# Patient Record
Sex: Male | Born: 1975 | Race: White | Hispanic: No | Marital: Married | Smoking: Former smoker
Health system: Southern US, Community
[De-identification: ages and names within clinical notes are randomized; demographics above are authoritative.]

## PROBLEM LIST (undated history)

## (undated) DIAGNOSIS — N529 Male erectile dysfunction, unspecified: Secondary | ICD-10-CM

## (undated) DIAGNOSIS — I700 Atherosclerosis of aorta: Secondary | ICD-10-CM

## (undated) DIAGNOSIS — K635 Polyp of colon: Secondary | ICD-10-CM

## (undated) DIAGNOSIS — G500 Trigeminal neuralgia: Secondary | ICD-10-CM

## (undated) DIAGNOSIS — A4902 Methicillin resistant Staphylococcus aureus infection, unspecified site: Secondary | ICD-10-CM

## (undated) DIAGNOSIS — Z79899 Other long term (current) drug therapy: Secondary | ICD-10-CM

## (undated) DIAGNOSIS — Z7960 Long term (current) use of unspecified immunomodulators and immunosuppressants: Secondary | ICD-10-CM

## (undated) DIAGNOSIS — IMO0002 Reserved for concepts with insufficient information to code with codable children: Secondary | ICD-10-CM

## (undated) DIAGNOSIS — E1165 Type 2 diabetes mellitus with hyperglycemia: Secondary | ICD-10-CM

## (undated) DIAGNOSIS — M059 Rheumatoid arthritis with rheumatoid factor, unspecified: Secondary | ICD-10-CM

## (undated) DIAGNOSIS — I10 Essential (primary) hypertension: Secondary | ICD-10-CM

## (undated) DIAGNOSIS — N189 Chronic kidney disease, unspecified: Secondary | ICD-10-CM

## (undated) DIAGNOSIS — Z87442 Personal history of urinary calculi: Secondary | ICD-10-CM

## (undated) DIAGNOSIS — Z87891 Personal history of nicotine dependence: Secondary | ICD-10-CM

## (undated) DIAGNOSIS — R7989 Other specified abnormal findings of blood chemistry: Secondary | ICD-10-CM

## (undated) DIAGNOSIS — R918 Other nonspecific abnormal finding of lung field: Secondary | ICD-10-CM

## (undated) DIAGNOSIS — E785 Hyperlipidemia, unspecified: Secondary | ICD-10-CM

## (undated) HISTORY — DX: Hyperlipidemia, unspecified: E78.5

## (undated) HISTORY — DX: Rheumatoid arthritis with rheumatoid factor, unspecified: M05.9

## (undated) HISTORY — DX: Chronic kidney disease, unspecified: N18.9

## (undated) HISTORY — DX: Other long term (current) drug therapy: Z79.899

## (undated) HISTORY — DX: Type 2 diabetes mellitus with hyperglycemia: E11.65

## (undated) HISTORY — PX: TONSILLECTOMY: SHX5217

## (undated) HISTORY — DX: Trigeminal neuralgia: G50.0

## (undated) HISTORY — DX: Reserved for concepts with insufficient information to code with codable children: IMO0002

## (undated) HISTORY — DX: Essential (primary) hypertension: I10

## (undated) HISTORY — PX: APPENDECTOMY: SHX54

## (undated) HISTORY — PX: TONSILLECTOMY: SUR1361

---

## 2002-08-22 ENCOUNTER — Ambulatory Visit (HOSPITAL_COMMUNITY): Admission: RE | Admit: 2002-08-22 | Discharge: 2002-08-22 | Payer: Self-pay | Admitting: Family Medicine

## 2003-01-27 ENCOUNTER — Emergency Department (HOSPITAL_COMMUNITY): Admission: EM | Admit: 2003-01-27 | Discharge: 2003-01-27 | Payer: Self-pay | Admitting: Emergency Medicine

## 2003-12-26 ENCOUNTER — Emergency Department (HOSPITAL_COMMUNITY): Admission: EM | Admit: 2003-12-26 | Discharge: 2003-12-26 | Payer: Self-pay | Admitting: Emergency Medicine

## 2004-05-18 ENCOUNTER — Emergency Department: Payer: Self-pay | Admitting: Emergency Medicine

## 2004-07-23 ENCOUNTER — Emergency Department (HOSPITAL_COMMUNITY): Admission: EM | Admit: 2004-07-23 | Discharge: 2004-07-24 | Payer: Self-pay | Admitting: Emergency Medicine

## 2004-07-26 ENCOUNTER — Emergency Department (HOSPITAL_COMMUNITY): Admission: EM | Admit: 2004-07-26 | Discharge: 2004-07-26 | Payer: Self-pay | Admitting: Emergency Medicine

## 2004-07-27 ENCOUNTER — Emergency Department (HOSPITAL_COMMUNITY): Admission: EM | Admit: 2004-07-27 | Discharge: 2004-07-27 | Payer: Self-pay | Admitting: *Deleted

## 2007-03-16 ENCOUNTER — Emergency Department (HOSPITAL_COMMUNITY): Admission: EM | Admit: 2007-03-16 | Discharge: 2007-03-16 | Payer: Self-pay | Admitting: Emergency Medicine

## 2012-01-09 DIAGNOSIS — I2510 Atherosclerotic heart disease of native coronary artery without angina pectoris: Secondary | ICD-10-CM

## 2012-01-09 HISTORY — DX: Atherosclerotic heart disease of native coronary artery without angina pectoris: I25.10

## 2013-03-29 HISTORY — PX: VASECTOMY: SHX75

## 2013-04-20 DIAGNOSIS — Z302 Encounter for sterilization: Secondary | ICD-10-CM | POA: Insufficient documentation

## 2013-06-01 DIAGNOSIS — Z308 Encounter for other contraceptive management: Secondary | ICD-10-CM | POA: Insufficient documentation

## 2013-09-21 ENCOUNTER — Encounter: Payer: Self-pay | Admitting: Podiatry

## 2013-09-21 ENCOUNTER — Ambulatory Visit (INDEPENDENT_AMBULATORY_CARE_PROVIDER_SITE_OTHER): Payer: BC Managed Care – PPO

## 2013-09-21 ENCOUNTER — Ambulatory Visit (INDEPENDENT_AMBULATORY_CARE_PROVIDER_SITE_OTHER): Payer: BC Managed Care – PPO | Admitting: Podiatry

## 2013-09-21 VITALS — BP 131/85 | HR 77 | Resp 16 | Ht 73.0 in | Wt 250.0 lb

## 2013-09-21 DIAGNOSIS — M722 Plantar fascial fibromatosis: Secondary | ICD-10-CM

## 2013-09-21 MED ORDER — TRIAMCINOLONE ACETONIDE 10 MG/ML IJ SUSP
10.0000 mg | Freq: Once | INTRAMUSCULAR | Status: AC
  Administered 2013-09-21: 10 mg

## 2013-09-21 MED ORDER — MELOXICAM 15 MG PO TABS: 15.0000 mg | ORAL_TABLET | Freq: Every day | ORAL | Status: DC

## 2013-09-21 NOTE — Patient Instructions (Signed)
Plantar Fasciitis (Heel Spur Syndrome) with Rehab The plantar fascia is a fibrous, ligament-like, soft-tissue structure that spans the bottom of the foot. Plantar fasciitis is a condition that causes pain in the foot due to inflammation of the tissue. SYMPTOMS   Pain and tenderness on the underneath side of the foot.  Pain that worsens with standing or walking. CAUSES  Plantar fasciitis is caused by irritation and injury to the plantar fascia on the underneath side of the foot. Common mechanisms of injury include:  Direct trauma to bottom of the foot.  Damage to a small nerve that runs under the foot where the main fascia attaches to the heel bone.  Stress placed on the plantar fascia due to bone spurs. RISK INCREASES WITH:   Activities that place stress on the plantar fascia (running, jumping, pivoting, or cutting).  Poor strength and flexibility.  Improperly fitted shoes.  Tight calf muscles.  Flat feet.  Failure to warm-up properly before activity.  Obesity. PREVENTION  Warm up and stretch properly before activity.  Allow for adequate recovery between workouts.  Maintain physical fitness:  Strength, flexibility, and endurance.  Cardiovascular fitness.  Maintain a health body weight.  Avoid stress on the plantar fascia.  Wear properly fitted shoes, including arch supports for individuals who have flat feet. PROGNOSIS  If treated properly, then the symptoms of plantar fasciitis usually resolve without surgery. However, occasionally surgery is necessary. RELATED COMPLICATIONS   Recurrent symptoms that may result in a chronic condition.  Problems of the lower back that are caused by compensating for the injury, such as limping.  Pain or weakness of the foot during push-off following surgery.  Chronic inflammation, scarring, and partial or complete fascia tear, occurring more often from repeated injections. TREATMENT  Treatment initially involves the use of  ice and medication to help reduce pain and inflammation. The use of strengthening and stretching exercises may help reduce pain with activity, especially stretches of the Achilles tendon. These exercises may be performed at home or with a therapist. Your caregiver may recommend that you use heel cups of arch supports to help reduce stress on the plantar fascia. Occasionally, corticosteroid injections are given to reduce inflammation. If symptoms persist for greater than 6 months despite non-surgical (conservative), then surgery may be recommended.  MEDICATION   If pain medication is necessary, then nonsteroidal anti-inflammatory medications, such as aspirin and ibuprofen, or other minor pain relievers, such as acetaminophen, are often recommended.  Do not take pain medication within 7 days before surgery.  Prescription pain relievers may be given if deemed necessary by your caregiver. Use only as directed and only as much as you need.  Corticosteroid injections may be given by your caregiver. These injections should be reserved for the most serious cases, because they may only be given a certain number of times. HEAT AND COLD  Cold treatment (icing) relieves pain and reduces inflammation. Cold treatment should be applied for 10 to 15 minutes every 2 to 3 hours for inflammation and pain and immediately after any activity that aggravates your symptoms. Use ice packs or massage the area with a piece of ice (ice massage).  Heat treatment may be used prior to performing the stretching and strengthening activities prescribed by your caregiver, physical therapist, or athletic trainer. Use a heat pack or soak the injury in warm water. SEEK IMMEDIATE MEDICAL CARE IF:  Treatment seems to offer no benefit, or the condition worsens.  Any medications produce adverse side effects. EXERCISES RANGE   OF MOTION (ROM) AND STRETCHING EXERCISES - Plantar Fasciitis (Heel Spur Syndrome) These exercises may help you  when beginning to rehabilitate your injury. Your symptoms may resolve with or without further involvement from your physician, physical therapist or athletic trainer. While completing these exercises, remember:   Restoring tissue flexibility helps normal motion to return to the joints. This allows healthier, less painful movement and activity.  An effective stretch should be held for at least 30 seconds.  A stretch should never be painful. You should only feel a gentle lengthening or release in the stretched tissue. RANGE OF MOTION - Toe Extension, Flexion  Sit with your right / left leg crossed over your opposite knee.  Grasp your toes and gently pull them back toward the top of your foot. You should feel a stretch on the bottom of your toes and/or foot.  Hold this stretch for __________ seconds.  Now, gently pull your toes toward the bottom of your foot. You should feel a stretch on the top of your toes and or foot.  Hold this stretch for __________ seconds. Repeat __________ times. Complete this stretch __________ times per day.  RANGE OF MOTION - Ankle Dorsiflexion, Active Assisted  Remove shoes and sit on a chair that is preferably not on a carpeted surface.  Place right / left foot under knee. Extend your opposite leg for support.  Keeping your heel down, slide your right / left foot back toward the chair until you feel a stretch at your ankle or calf. If you do not feel a stretch, slide your bottom forward to the edge of the chair, while still keeping your heel down.  Hold this stretch for __________ seconds. Repeat __________ times. Complete this stretch __________ times per day.  STRETCH - Gastroc, Standing  Place hands on wall.  Extend right / left leg, keeping the front knee somewhat bent.  Slightly point your toes inward on your back foot.  Keeping your right / left heel on the floor and your knee straight, shift your weight toward the wall, not allowing your back to  arch.  You should feel a gentle stretch in the right / left calf. Hold this position for __________ seconds. Repeat __________ times. Complete this stretch __________ times per day. STRETCH - Soleus, Standing  Place hands on wall.  Extend right / left leg, keeping the other knee somewhat bent.  Slightly point your toes inward on your back foot.  Keep your right / left heel on the floor, bend your back knee, and slightly shift your weight over the back leg so that you feel a gentle stretch deep in your back calf.  Hold this position for __________ seconds. Repeat __________ times. Complete this stretch __________ times per day. STRETCH - Gastrocsoleus, Standing  Note: This exercise can place a lot of stress on your foot and ankle. Please complete this exercise only if specifically instructed by your caregiver.   Place the ball of your right / left foot on a step, keeping your other foot firmly on the same step.  Hold on to the wall or a rail for balance.  Slowly lift your other foot, allowing your body weight to press your heel down over the edge of the step.  You should feel a stretch in your right / left calf.  Hold this position for __________ seconds.  Repeat this exercise with a slight bend in your right / left knee. Repeat __________ times. Complete this stretch __________ times per day.    STRENGTHENING EXERCISES - Plantar Fasciitis (Heel Spur Syndrome)  These exercises may help you when beginning to rehabilitate your injury. They may resolve your symptoms with or without further involvement from your physician, physical therapist or athletic trainer. While completing these exercises, remember:   Muscles can gain both the endurance and the strength needed for everyday activities through controlled exercises.  Complete these exercises as instructed by your physician, physical therapist or athletic trainer. Progress the resistance and repetitions only as guided. STRENGTH -  Towel Curls  Sit in a chair positioned on a non-carpeted surface.  Place your foot on a towel, keeping your heel on the floor.  Pull the towel toward your heel by only curling your toes. Keep your heel on the floor.  If instructed by your physician, physical therapist or athletic trainer, add ____________________ at the end of the towel. Repeat __________ times. Complete this exercise __________ times per day. STRENGTH - Ankle Inversion  Secure one end of a rubber exercise band/tubing to a fixed object (table, pole). Loop the other end around your foot just before your toes.  Place your fists between your knees. This will focus your strengthening at your ankle.  Slowly, pull your big toe up and in, making sure the band/tubing is positioned to resist the entire motion.  Hold this position for __________ seconds.  Have your muscles resist the band/tubing as it slowly pulls your foot back to the starting position. Repeat __________ times. Complete this exercises __________ times per day.  Document Released: 03/15/2005 Document Revised: 06/07/2011 Document Reviewed: 06/27/2008 [employer] Patient Information 2015 [employer]. This information is not intended to replace advice given to you by your health care provider. Make sure you discuss any questions you have with your health care provider.  

## 2013-09-21 NOTE — Progress Notes (Signed)
   Subjective:    Patient ID: Nathan Flockonald R Geers Jr., male    DOB: 14-Jul-1975, 38 y.o.   MRN: [id number]  HPI Comments: Been having right heel pain for over a month now, it is a sharp pain. It is not constant and does not hurt every day.  i have noticed that i am walking on the side of my foot   Foot Pain      Review of Systems  Endocrine:       Diabetes  All other systems reviewed and are negative.      Objective:   Physical Exam        Assessment & Plan:

## 2013-09-22 NOTE — Progress Notes (Signed)
Subjective:     Patient ID: Nathan Flockonald R Renegar Jr., male   DOB: 09-12-1975, 38 y.o.   MRN: [id number]  Foot Pain   patient states he's having a lot of pain in his right heel and it's difficult when he gets up in the morning and after periods of sitting and is been going on for over a month   Review of Systems  All other systems reviewed and are negative.      Objective:   Physical Exam  Nursing note and vitals reviewed. Constitutional: He is oriented to person, place, and time.  Cardiovascular: Intact distal pulses.   Musculoskeletal: Normal range of motion.  Neurological: He is oriented to person, place, and time.  Skin: Skin is warm.   neurovascular status intact with muscle strength adequate and range of motion subtalar midtarsal joint within normal limits. Patient's found to have good digital perfusion and normal arch height and is found to have severe discomfort at the insertion of the medial fascially band into the calcaneus     Assessment:     Plantar fasciitis of the right heel insertional    Plan:     H&P and x-ray reviewed and injected the right plantar fascia 3 mg Kenalog 5 mg I can Marcaine mixture and applied fascially brace with instructions and placed on diclofenac and reappoint one month when he returns from vacation

## 2013-10-19 ENCOUNTER — Ambulatory Visit: Payer: BC Managed Care – PPO | Admitting: Podiatry

## 2014-09-16 DIAGNOSIS — E1165 Type 2 diabetes mellitus with hyperglycemia: Secondary | ICD-10-CM | POA: Insufficient documentation

## 2014-09-16 DIAGNOSIS — E1169 Type 2 diabetes mellitus with other specified complication: Secondary | ICD-10-CM | POA: Insufficient documentation

## 2014-09-16 DIAGNOSIS — N189 Chronic kidney disease, unspecified: Secondary | ICD-10-CM | POA: Insufficient documentation

## 2014-09-16 DIAGNOSIS — E785 Hyperlipidemia, unspecified: Secondary | ICD-10-CM | POA: Insufficient documentation

## 2014-09-16 DIAGNOSIS — I10 Essential (primary) hypertension: Secondary | ICD-10-CM | POA: Insufficient documentation

## 2014-09-20 ENCOUNTER — Ambulatory Visit (INDEPENDENT_AMBULATORY_CARE_PROVIDER_SITE_OTHER): Payer: BLUE CROSS/BLUE SHIELD | Admitting: Family Medicine

## 2014-09-20 ENCOUNTER — Encounter: Payer: Self-pay | Admitting: Family Medicine

## 2014-09-20 VITALS — BP 125/88 | HR 78 | Temp 97.7°F | Ht 72.5 in | Wt 249.0 lb

## 2014-09-20 DIAGNOSIS — Z5181 Encounter for therapeutic drug level monitoring: Secondary | ICD-10-CM | POA: Diagnosis not present

## 2014-09-20 DIAGNOSIS — F1910 Other psychoactive substance abuse, uncomplicated: Secondary | ICD-10-CM | POA: Diagnosis not present

## 2014-09-20 DIAGNOSIS — E119 Type 2 diabetes mellitus without complications: Secondary | ICD-10-CM

## 2014-09-20 DIAGNOSIS — E785 Hyperlipidemia, unspecified: Secondary | ICD-10-CM

## 2014-09-20 DIAGNOSIS — I10 Essential (primary) hypertension: Secondary | ICD-10-CM

## 2014-09-20 DIAGNOSIS — Z720 Tobacco use: Secondary | ICD-10-CM

## 2014-09-20 DIAGNOSIS — N182 Chronic kidney disease, stage 2 (mild): Secondary | ICD-10-CM | POA: Diagnosis not present

## 2014-09-20 LAB — MICROALBUMIN, URINE WAIVED
Creatinine, Urine Waived: 300 mg/dL (ref 10–300)
Microalb, Ur Waived: 30 mg/L — ABNORMAL HIGH (ref 0–19)
Microalb/Creat Ratio: <30 mg/g (ref <30)

## 2014-09-20 MED ORDER — VARENICLINE TARTRATE 0.5 MG X 11 & 1 MG X 42 PO MISC: ORAL | Status: DC

## 2014-09-20 NOTE — Assessment & Plan Note (Signed)
Foot exam by MD today; check feet every night; limit sweets; modest weigh tloss, check A1C and microalbumin today; if A1C controlled, back in 6 months

## 2014-09-20 NOTE — Assessment & Plan Note (Signed)
Check labels and limit sodium; avoid decongetsants; work on modest weight loss; continue ACE-I; check Cr and K+

## 2014-09-20 NOTE — Assessment & Plan Note (Signed)
Limit saturated fats like bacon, sausage, hot dogs, bologna; continue statin, check lipids and sgpt today

## 2014-09-20 NOTE — Progress Notes (Signed)
BP 125/88 mmHg  Pulse 78  Temp(Src) 97.7 F (36.5 C)  Ht 6' 0.5" (1.842 m)  Wt 249 lb (112.946 kg)  BMI 33.29 kg/m2  SpO2 96%   Subjective:    Patient ID: Nathan Rojas, male    DOB: 12-05-1975, 39 y.o.   MRN: [id number]  HPI: Nathan Rojas is a 39 y.o. male  Chief Complaint  Patient presents with  . Diabetes  . Hyperlipidemia  . Hypertension   He came fasting for labs this morning F/u for several issues No major incidents last visit  HTN; he does not check BP away from doctor's; limits salt; does not add salt to his food; does not add salt when cooking; checks labels; no decongestants  Diabetes; checking FSBS occasionally, keeping sugars less than 140; limiting sweets and no more sweet tea; not checking feet; last eye exam early 2015, no damage;   High chol; typical Southern diet; has cut back on fried foods  He has started smoking; he says the stress of his job, grabs a cig to relieve stress; pretty sporadic, does not smoke in the house; just smokes out and about  Relevant past medical, surgical, family and social history reviewed and updated as indicated. Interim medical history since our last visit reviewed. Allergies and medications reviewed and updated.  Review of Systems  Per HPI unless specifically indicated above     Objective:    BP 125/88 mmHg  Pulse 78  Temp(Src) 97.7 F (36.5 C)  Ht 6' 0.5" (1.842 m)  Wt 249 lb (112.946 kg)  BMI 33.29 kg/m2  SpO2 96%  Wt Readings from Last 3 Encounters:  09/20/14 249 lb (112.946 kg)  12/21/13 234 lb (106.142 kg)  09/21/13 250 lb (113.399 kg)    Physical Exam  Constitutional: He appears well-developed and well-nourished. No distress.  HENT:  Head: Normocephalic and atraumatic.  Nose: No rhinorrhea.  Mouth/Throat: Mucous membranes are normal.  Eyes: EOM are normal. No scleral icterus.  Neck: No JVD present.  Cardiovascular: Normal rate, regular rhythm and normal heart sounds.   No murmur  heard. Pulmonary/Chest: Effort normal and breath sounds normal. No respiratory distress. He has no wheezes.  Abdominal: Soft. Bowel sounds are normal. He exhibits no distension. There is no tenderness.  Musculoskeletal: He exhibits no edema.  Neurological: He is alert. He displays no tremor. He exhibits normal muscle tone.  Skin: Skin is warm and dry. No rash noted. He is not diaphoretic. No cyanosis.  Psychiatric: He has a normal mood and affect. His behavior is normal. Judgment and thought content normal.  Nursing note and vitals reviewed.  Diabetic Foot Form - Detailed   Diabetic Foot Exam - detailed  Diabetic Foot exam was performed with the following findings:  Yes 09/20/2014  8:46 AM  Visual Foot Exam completed.:  Yes  Are the toenails long?:  No  Are the toenails thick?:  No  Are the toenails ingrown?:  No    Pulse Foot Exam completed.:  Yes  Right Dorsalis Pedis:  Present Left Dorsalis Pedis:  Present  Sensory Foot Exam Completed.:  Yes  Semmes-Weinstein Monofilament Test  R Site 1-Great Toe:  Pos L Site 1-Great Toe:  Pos    Comments:  Intact to vibration and monofilament testing      No results found for this or any previous visit.    Assessment & Plan:   Problem List Items Addressed This Visit      Cardiovascular and Mediastinum  Hypertension    Check labels and limit sodium; avoid decongetsants; work on modest weight loss; continue ACE-I; check Cr and K+        Endocrine   Diabetes mellitus without complication - Primary    Foot exam by MD today; check feet every night; limit sweets; modest weigh tloss, check A1C and microalbumin today; if A1C controlled, back in 6 months      Relevant Orders   Hgb A1c w/o eAG   Lipid Panel w/o Chol/HDL Ratio   Microalbumin / creatinine urine ratio     Genitourinary   CKD (chronic kidney disease)    Monitor urine microalbumin and Cr; limit Mobic as much as possible        Other   Hyperlipidemia    Limit saturated  fats like bacon, sausage, hot dogs, bologna; continue statin, check lipids and sgpt today      Relevant Orders   Lipid Panel w/o Chol/HDL Ratio    Other Visit Diagnoses    Tobacco abuse, episodic        patient ready to quit; Chantix prescribed, risks discussed    Relevant Medications    varenicline (CHANTIX STARTING MONTH PAK) 0.5 MG X 11 & 1 MG X 42 tablet    Medication monitoring encounter        Relevant Orders    Comprehensive metabolic panel        Follow up plan: Return in about 6 months (around 03/22/2015).  An After Visit Summary was printed and given to the patient.

## 2014-09-20 NOTE — Patient Instructions (Addendum)
Check feet every night, good habit to get into Okay to check sugars just occasionally Do try to find another outlet for stress that's healthy Please see your eye doctor yearly Your goal blood pressure is less than 130 mmHg Try to follow the DASH guidelines (DASH stands for Dietary Approaches to Stop Hypertension) Try to limit the sodium in your diet.  Ideally, consume less than 1.5 grams (less than 1,500mg ) per day. Do not add salt when cooking or at the table.  Check the sodium amount on labels when shopping, and choose items lower in sodium when given a choice. Avoid or limit foods that already contain a lot of sodium. Eat a diet rich in fruits and vegetables and whole grains. You can quit smoking; it will be the best thing you do for your health Side effects of Chantix may include nausea/vomiting, vivid dreams, behaviour changes, depression, suicidal ideation You can call 1-800-QUIT-NOW to speak with a quit coach and get assistance with quitting Choose a quit date about 1 week after starting medicine. Don't take meloxicam or other non-steroidals unless absolutely needed; tylenol (acetaminophen) is safest for headaches, minor pains and won't harm your kidneys; always use that per package instructions Try to lose 5-10 pounds before your next visit

## 2014-09-20 NOTE — Assessment & Plan Note (Signed)
Monitor urine microalbumin and Cr; limit Mobic as much as possible

## 2014-09-21 LAB — LIPID PANEL W/O CHOL/HDL RATIO
Cholesterol, Total: 101 mg/dL (ref 100–199)
HDL: 22 mg/dL — ABNORMAL LOW (ref >39)
LDL Calculated: 42 mg/dL (ref 0–99)
Triglycerides: 184 mg/dL — ABNORMAL HIGH (ref 0–149)
VLDL Cholesterol Cal: 37 mg/dL (ref 5–40)

## 2014-09-21 LAB — COMPREHENSIVE METABOLIC PANEL
ALT: 33 IU/L (ref 0–44)
AST: 21 IU/L (ref 0–40)
Albumin/Globulin Ratio: 2 (ref 1.1–2.5)
Albumin: 4.6 g/dL (ref 3.5–5.5)
Alkaline Phosphatase: 67 IU/L (ref 39–117)
BUN/Creatinine Ratio: 19 (ref 9–20)
BUN: 18 mg/dL (ref 6–24)
Bilirubin Total: 0.6 mg/dL (ref 0.0–1.2)
CO2: 22 mmol/L (ref 18–29)
Calcium: 9.4 mg/dL (ref 8.7–10.2)
Chloride: 101 mmol/L (ref 97–108)
Creatinine, Ser: 0.96 mg/dL (ref 0.76–1.27)
GFR calc Af Amer: 113 mL/min/{1.73_m2} (ref >59)
GFR calc non Af Amer: 98 mL/min/{1.73_m2} (ref >59)
Globulin, Total: 2.3 g/dL (ref 1.5–4.5)
Glucose: 167 mg/dL — ABNORMAL HIGH (ref 65–99)
Potassium: 5.1 mmol/L (ref 3.5–5.2)
Sodium: 140 mmol/L (ref 134–144)
Total Protein: 6.9 g/dL (ref 6.0–8.5)

## 2014-09-21 LAB — HGB A1C W/O EAG: Hgb A1c MFr Bld: 7.6 % — ABNORMAL HIGH (ref 4.8–5.6)

## 2014-10-15 ENCOUNTER — Other Ambulatory Visit: Payer: Self-pay | Admitting: Family Medicine

## 2014-10-15 NOTE — Telephone Encounter (Signed)
Routing to provider  

## 2014-10-17 ENCOUNTER — Other Ambulatory Visit: Payer: Self-pay | Admitting: Family Medicine

## 2014-10-29 ENCOUNTER — Telehealth: Payer: Self-pay | Admitting: Family Medicine

## 2014-10-29 NOTE — Telephone Encounter (Signed)
Pt called stated Dr. Sherie Don was supposed to send an RX for Chantix but the pharmacy has never received the RX. Pharm is [facility] on [address] Thanks.

## 2014-10-29 NOTE — Telephone Encounter (Signed)
RX was already in chart. RX called into the pharmacy.

## 2014-11-13 ENCOUNTER — Other Ambulatory Visit: Payer: Self-pay | Admitting: Family Medicine

## 2014-11-13 NOTE — Telephone Encounter (Signed)
Routing to provider  

## 2014-11-15 ENCOUNTER — Emergency Department: Payer: BLUE CROSS/BLUE SHIELD

## 2014-11-15 ENCOUNTER — Emergency Department
Admission: EM | Admit: 2014-11-15 | Discharge: 2014-11-15 | Disposition: A | Discharge status: Home / Self Care | Payer: BLUE CROSS/BLUE SHIELD | Attending: Emergency Medicine | Admitting: Emergency Medicine

## 2014-11-15 DIAGNOSIS — I129 Hypertensive chronic kidney disease with stage 1 through stage 4 chronic kidney disease, or unspecified chronic kidney disease: Secondary | ICD-10-CM | POA: Diagnosis not present

## 2014-11-15 DIAGNOSIS — R109 Unspecified abdominal pain: Secondary | ICD-10-CM

## 2014-11-15 DIAGNOSIS — E119 Type 2 diabetes mellitus without complications: Secondary | ICD-10-CM | POA: Diagnosis not present

## 2014-11-15 DIAGNOSIS — Z79899 Other long term (current) drug therapy: Secondary | ICD-10-CM | POA: Diagnosis not present

## 2014-11-15 DIAGNOSIS — N200 Calculus of kidney: Secondary | ICD-10-CM | POA: Diagnosis not present

## 2014-11-15 DIAGNOSIS — Z7982 Long term (current) use of aspirin: Secondary | ICD-10-CM | POA: Insufficient documentation

## 2014-11-15 DIAGNOSIS — Z720 Tobacco use: Secondary | ICD-10-CM | POA: Diagnosis not present

## 2014-11-15 DIAGNOSIS — N189 Chronic kidney disease, unspecified: Secondary | ICD-10-CM | POA: Diagnosis not present

## 2014-11-15 DIAGNOSIS — I700 Atherosclerosis of aorta: Secondary | ICD-10-CM | POA: Clinically undetermined

## 2014-11-15 LAB — CBC WITH DIFFERENTIAL/PLATELET
Basophils Absolute: 0 10*3/uL (ref 0–0.1)
Basophils Relative: 0 %
Eosinophils Absolute: 0 10*3/uL (ref 0–0.7)
Eosinophils Relative: 0 %
HCT: 44.3 % (ref 40.0–52.0)
Hemoglobin: 14.9 g/dL (ref 13.0–18.0)
Lymphocytes Relative: 15 %
Lymphs Abs: 1.4 10*3/uL (ref 1.0–3.6)
MCH: 29.5 pg (ref 26.0–34.0)
MCHC: 33.7 g/dL (ref 32.0–36.0)
MCV: 87.4 fL (ref 80.0–100.0)
Monocytes Absolute: 0.4 10*3/uL (ref 0.2–1.0)
Monocytes Relative: 4 %
Neutro Abs: 7.3 10*3/uL — ABNORMAL HIGH (ref 1.4–6.5)
Neutrophils Relative %: 81 %
Platelets: 279 10*3/uL (ref 150–440)
RBC: 5.07 MIL/uL (ref 4.40–5.90)
RDW: 13.8 % (ref 11.5–14.5)
WBC: 9.1 10*3/uL (ref 3.8–10.6)

## 2014-11-15 LAB — URINALYSIS COMPLETE WITH MICROSCOPIC (ARMC ONLY)
Bacteria, UA: NONE SEEN
Bilirubin Urine: NEGATIVE
Glucose, UA: NEGATIVE mg/dL
Ketones, ur: NEGATIVE mg/dL
Leukocytes, UA: NEGATIVE
Nitrite: NEGATIVE
Protein, ur: 30 mg/dL — AB
Specific Gravity, Urine: 1.023 (ref 1.005–1.030)
pH: 5 (ref 5.0–8.0)

## 2014-11-15 LAB — COMPREHENSIVE METABOLIC PANEL
ALT: 35 U/L (ref 17–63)
AST: 29 U/L (ref 15–41)
Albumin: 5.1 g/dL — ABNORMAL HIGH (ref 3.5–5.0)
Alkaline Phosphatase: 67 U/L (ref 38–126)
Anion gap: 11 (ref 5–15)
BUN: 22 mg/dL — ABNORMAL HIGH (ref 6–20)
CO2: 24 mmol/L (ref 22–32)
Calcium: 9.6 mg/dL (ref 8.9–10.3)
Chloride: 105 mmol/L (ref 101–111)
Creatinine, Ser: 1.29 mg/dL — ABNORMAL HIGH (ref 0.61–1.24)
GFR calc Af Amer: >60 mL/min (ref >60)
GFR calc non Af Amer: >60 mL/min (ref >60)
Glucose, Bld: 144 mg/dL — ABNORMAL HIGH (ref 65–99)
Potassium: 4.4 mmol/L (ref 3.5–5.1)
Sodium: 140 mmol/L (ref 135–145)
Total Bilirubin: 1.1 mg/dL (ref 0.3–1.2)
Total Protein: 8.1 g/dL (ref 6.5–8.1)

## 2014-11-15 MED ORDER — NAPROXEN 500 MG PO TABS: 500.0000 mg | ORAL_TABLET | Freq: Two times a day (BID) | ORAL | Status: DC

## 2014-11-15 MED ORDER — KETOROLAC TROMETHAMINE 30 MG/ML IJ SOLN
30.0000 mg | Freq: Once | INTRAMUSCULAR | Status: AC
  Administered 2014-11-15: 30 mg via INTRAVENOUS
  Filled 2014-11-15: qty 1

## 2014-11-15 MED ORDER — OXYCODONE-ACETAMINOPHEN 5-325 MG PO TABS: 1.0000 | ORAL_TABLET | Freq: Four times a day (QID) | ORAL | Status: DC | PRN

## 2014-11-15 MED ORDER — SODIUM CHLORIDE 0.9 % IV SOLN
1000.0000 mL | Freq: Once | INTRAVENOUS | Status: AC
  Administered 2014-11-15: 1000 mL via INTRAVENOUS

## 2014-11-15 MED ORDER — TAMSULOSIN HCL 0.4 MG PO CAPS: 0.4000 mg | ORAL_CAPSULE | Freq: Every day | ORAL | Status: DC

## 2014-11-15 MED ORDER — ONDANSETRON HCL 4 MG PO TABS: 4.0000 mg | ORAL_TABLET | Freq: Every day | ORAL | Status: DC | PRN

## 2014-11-15 MED ORDER — CIPROFLOXACIN HCL 500 MG PO TABS
500.0000 mg | ORAL_TABLET | Freq: Two times a day (BID) | ORAL | Status: DC
Start: 2014-11-15 — End: 2015-04-18

## 2014-11-15 NOTE — ED Provider Notes (Signed)
[facility] Emergency Department Provider Note  ____________________________________________  Time seen: On arrival  I have reviewed the triage vital signs and the nursing notes.   HISTORY  Chief Complaint Abdominal Pain    HPI Nathan Rojas is a 39 y.o. male who presents with left groin pain radiating to his left flank. This began abruptly this morning and is intermittently becomes severe to the point that it has left him sweating and in the fetal position. He has never had this before. No history of kidney stones. No fevers or chills. No dysuria. He has noticed his urine was dark. No groin mass     Past Medical History  Diagnosis Date  . Diabetes mellitus without complication   . Hypertension   . Hyperlipidemia   . CKD (chronic kidney disease)     Patient Active Problem List   Diagnosis Date Noted  . Diabetes mellitus without complication   . Hypertension   . Hyperlipidemia   . CKD (chronic kidney disease)     Past Surgical History  Procedure Laterality Date  . Tonsillectomy    . Appendectomy    . Vasectomy  Jan 2015    Current Outpatient Rx  Name  Route  Sig  Dispense  Refill  . aspirin EC 81 MG tablet   Oral   Take 81 mg by mouth daily.         Marland Kitchen atorvastatin (LIPITOR) 40 MG tablet   Oral   Take 1 tablet (40 mg total) by mouth at bedtime.   90 tablet   1   . lisinopril (PRINIVIL,ZESTRIL) 10 MG tablet      TAKE 1 TABLET DAILY   90 tablet   1   . metFORMIN (GLUCOPHAGE) 500 MG tablet   Oral   Take 2 tablets (1,000 mg total) by mouth 2 (two) times daily. Best if taken 15-30 minutes prior to morning and evening meals   360 tablet   1   . varenicline (CHANTIX STARTING MONTH PAK) 0.5 MG X 11 & 1 MG X 42 tablet      Take one 0.5 mg tablet by mouth once daily for 3 days, then increase to one 0.5 mg tablet twice daily for 4 days, then increase to one 1 mg tablet twice daily.   53 tablet   0     Allergies Review of  patient's allergies indicates no known allergies.  Family History  Problem Relation Age of Onset  . Heart disease Father   . Hypertension Father   . Diabetes Father     borderline  . Mental illness Father   . Diabetes Sister     type 1    Social History Social History  Substance Use Topics  . Smoking status: Current Every Day Smoker -- 1.00 packs/day    Types: Cigarettes  . Smokeless tobacco: Never Used  . Alcohol Use: No    Review of Systems  Constitutional: Negative for fever. Eyes: Negative for visual changes. ENT: Negative for sore throat Cardiovascular: Negative for chest pain. Respiratory: Negative for shortness of breath. Gastrointestinal: Positive for flank pain Genitourinary: Negative for dysuria. Musculoskeletal: Negative for back pain. Skin: Negative for rash. Neurological: Negative for headaches or focal weakness     ____________________________________________   PHYSICAL EXAM:  VITAL SIGNS: ED Triage Vitals  Enc Vitals Group     BP 11/15/14 1909 149/91 mmHg     Pulse Rate 11/15/14 1909 69     Resp 11/15/14 1909 14  Temp 11/15/14 1909 98.3 F (36.8 C)     Temp Source 11/15/14 1909 Oral     SpO2 11/15/14 1909 97 %     Weight 11/15/14 1909 250 lb (113.399 kg)     Height 11/15/14 1909 6\' 1"  (1.854 m)     Head Cir --      Peak Flow --      Pain Score 11/15/14 1910 6     Pain Loc --      Pain Edu? --      Excl. in GC? --     } Constitutional: Alert and oriented. Well appearing and in no distress. Eyes: Conjunctivae are normal.  ENT   Head: Normocephalic and atraumatic.   Mouth/Throat: Mucous membranes are moist. Cardiovascular: Normal rate, regular rhythm. Normal and symmetric distal pulses are present in all extremities. No murmurs, rubs, or gallops. Respiratory: Normal respiratory effort without tachypnea nor retractions. Breath sounds are clear and equal bilaterally.  Gastrointestinal: Soft and non-tender in all quadrants. No  distention. There is no CVA tenderness. Genitourinary: No hernia noted Musculoskeletal: Nontender with normal range of motion in all extremities. No lower extremity tenderness nor edema. Neurologic:  Normal speech and language. No gross focal neurologic deficits are appreciated. Skin:  Skin is warm, dry and intact. No rash noted. Psychiatric: Mood and affect are normal. Patient exhibits appropriate insight and judgment.  ____________________________________________    LABS (pertinent positives/negatives)  Labs Reviewed  COMPREHENSIVE METABOLIC PANEL - Abnormal; Notable for the following:    Glucose, Bld 144 (*)    BUN 22 (*)    Creatinine, Ser 1.29 (*)    Albumin 5.1 (*)    All other components within normal limits  CBC WITH DIFFERENTIAL/PLATELET - Abnormal; Notable for the following:    Neutro Abs 7.3 (*)    All other components within normal limits  URINALYSIS COMPLETEWITH MICROSCOPIC ([facility] ONLY) - Abnormal; Notable for the following:    Color, Urine YELLOW (*)    APPearance CLOUDY (*)    Hgb urine dipstick 3+ (*)    Protein, ur 30 (*)    Squamous Epithelial / LPF 0-5 (*)    All other components within normal limits    ____________________________________________   EKG  None  ____________________________________________    RADIOLOGY I have personally reviewed any xrays that were ordered on this patient: Patient with 2 x 2 3 mm kidney stone  ____________________________________________   PROCEDURES  Procedure(s) performed: none  Critical Care performed: none  ____________________________________________   INITIAL IMPRESSION / ASSESSMENT AND PLAN / ED COURSE  Pertinent labs & imaging results that were available during my care of the patient were reviewed by me and considered in my medical decision making (see chart for details).  History of present illness suspicious for kidney stone. We will give IV Toradol, normal saline bolus and obtain CT renal stone  study and reevaluate   Pain controlled with IV Toradol. He has no bacteria in his urine, no fever, no elevated white blood cell count. He does have a history of diabetes. I provided analgesic's and nausea medication and discussed with him return precautions ____________________________________________   FINAL CLINICAL IMPRESSION(S) / ED DIAGNOSES  Final diagnoses:  Flank pain, acute  Kidney stone     Jene Every, MD 11/15/14 2303

## 2014-11-15 NOTE — ED Notes (Signed)
Pt states left lower quadrant pain radiating from left groin, testicle to left lower quadrant since this am with associated vomiting and "liquid stools". Pt states has "had cold sweats".

## 2014-11-15 NOTE — Discharge Instructions (Signed)

## 2014-11-15 NOTE — ED Notes (Signed)
Pt. Driving home. 

## 2014-11-15 NOTE — ED Notes (Signed)
Pt. States lt. Groan pain that stated around 10am this morning.  Pt. States pain radiated to lt. Flank around 4pm this afternoon.  Pt. Denies hx of kidney stones.

## 2014-11-16 DIAGNOSIS — I700 Atherosclerosis of aorta: Secondary | ICD-10-CM | POA: Clinically undetermined

## 2014-11-22 ENCOUNTER — Ambulatory Visit (INDEPENDENT_AMBULATORY_CARE_PROVIDER_SITE_OTHER): Payer: BLUE CROSS/BLUE SHIELD | Admitting: Family Medicine

## 2014-11-22 ENCOUNTER — Encounter: Payer: Self-pay | Admitting: Family Medicine

## 2014-11-22 VITALS — BP 145/97 | HR 67 | Temp 98.6°F | Ht 72.1 in | Wt 245.0 lb

## 2014-11-22 DIAGNOSIS — N200 Calculus of kidney: Secondary | ICD-10-CM | POA: Insufficient documentation

## 2014-11-22 HISTORY — DX: Calculus of kidney: N20.0

## 2014-11-22 LAB — MICROSCOPIC EXAMINATION

## 2014-11-22 MED ORDER — TAMSULOSIN HCL 0.4 MG PO CAPS: 0.4000 mg | ORAL_CAPSULE | Freq: Every day | ORAL | Status: DC

## 2014-11-22 MED ORDER — OXYCODONE-ACETAMINOPHEN 5-325 MG PO TABS: 1.0000 | ORAL_TABLET | Freq: Four times a day (QID) | ORAL | Status: DC | PRN

## 2014-11-22 NOTE — Patient Instructions (Signed)

## 2014-11-22 NOTE — Assessment & Plan Note (Signed)
Still has blood in his urine. Will restart his flomax. Will get him refill on pain medicine. Will start peeing through coffee filter and add some diet lemonade to his fluids. If hasn't passed it in 1 week, plan on re-x-raying to make sure it's moving and determine if he needs to see uro.

## 2014-11-22 NOTE — Progress Notes (Signed)
BP 145/97 mmHg  Pulse 67  Temp(Src) 98.6 F (37 C)  Ht 6' 0.1" (1.831 m)  Wt 245 lb (111.131 kg)  BMI 33.15 kg/m2  SpO2 97%   Subjective:    Patient ID: Nathan Rojas, male    DOB: 15-Oct-1975, 39 y.o.   MRN: [id number]  HPI: Nathan Rojas is a 39 y.o. male  Chief Complaint  Patient presents with  . Nephrolithiasis    patient states that for the last two days he has not been in a lot of pain, the Naproxen is controlling the pain right now. He states that when he does have a bad flare, he was taking the pain medication, which is now out of. He still has not passed the stone yet, so he would like to know if you can give him a refill of the pain medication.    ER FOLLOW UP Time since discharge: 5 days Hospital/facility: [facility] Diagnosis: Kidney stone Procedures/tests: X-ray, CT, IV tordol Consultants: none New medications: cipro, zofran, naproxen, flomax, percocet   Discharge instructions:  Follow up with PCP Status: better- has not passed stone yet  Finished the flomax yesterday. Hasn't noticed that he has passed that he hasn't passed any stones. Has been drinking lots of fluids. Naproxen is controlling the pain for now, but if he has a stone move, it won't control it. Not noticing any blood in his urine. Hasn't had any big spurts of pain in the past 2 days. Afraid that he is going to have more of that spurts of pain this weekend. Otherwise feeling well with no other concerns or complaints at this time.   Relevant past medical, surgical, family and social history reviewed and updated as indicated. Interim medical history since our last visit reviewed. Allergies and medications reviewed and updated.  Review of Systems  Constitutional: Negative.   Respiratory: Negative.   Cardiovascular: Negative.   Gastrointestinal: Negative.   Genitourinary: Negative.   Psychiatric/Behavioral: Negative.     Per HPI unless specifically indicated above     Objective:    BP 145/97 mmHg   Pulse 67  Temp(Src) 98.6 F (37 C)  Ht 6' 0.1" (1.831 m)  Wt 245 lb (111.131 kg)  BMI 33.15 kg/m2  SpO2 97%  Wt Readings from Last 3 Encounters:  11/22/14 245 lb (111.131 kg)  11/15/14 250 lb (113.399 kg)  09/20/14 249 lb (112.946 kg)    Physical Exam  Constitutional: He is oriented to person, place, and time. He appears well-developed and well-nourished. No distress.  HENT:  Head: Normocephalic and atraumatic.  Right Ear: Hearing normal.  Left Ear: Hearing normal.  Nose: Nose normal.  Eyes: Conjunctivae and lids are normal. Right eye exhibits no discharge. Left eye exhibits no discharge. No scleral icterus.  Cardiovascular: Normal rate, regular rhythm and intact distal pulses.  Exam reveals no gallop and no friction rub.   No murmur heard. Pulmonary/Chest: Effort normal and breath sounds normal. No respiratory distress. He has no wheezes. He has no rales. He exhibits no tenderness.  Abdominal: Soft. Bowel sounds are normal. He exhibits no distension and no mass. There is tenderness in the left upper quadrant and left lower quadrant. There is no rebound, no guarding and no CVA tenderness.  Musculoskeletal: Normal range of motion.  Neurological: He is alert and oriented to person, place, and time.  Skin: Skin is warm, dry and intact. No rash noted. No erythema. No pallor.  Psychiatric: He has a normal mood and affect. His  speech is normal and behavior is normal. Judgment and thought content normal. Cognition and memory are normal.  Nursing note and vitals reviewed.   Results for orders placed or performed during the hospital encounter of 11/15/14  Comprehensive metabolic panel  Result Value Ref Range   Sodium 140 135 - 145 mmol/L   Potassium 4.4 3.5 - 5.1 mmol/L   Chloride 105 101 - 111 mmol/L   CO2 24 22 - 32 mmol/L   Glucose, Bld 144 (H) 65 - 99 mg/dL   BUN 22 (H) 6 - 20 mg/dL   Creatinine, Ser 1.61 (H) 0.61 - 1.24 mg/dL   Calcium 9.6 8.9 - 09.6 mg/dL   Total Protein 8.1  6.5 - 8.1 g/dL   Albumin 5.1 (H) 3.5 - 5.0 g/dL   AST 29 15 - 41 U/L   ALT 35 17 - 63 U/L   Alkaline Phosphatase 67 38 - 126 U/L   Total Bilirubin 1.1 0.3 - 1.2 mg/dL   GFR calc non Af Amer >60 >60 mL/min   GFR calc Af Amer >60 >60 mL/min   Anion gap 11 5 - 15  CBC WITH DIFFERENTIAL  Result Value Ref Range   WBC 9.1 3.8 - 10.6 K/uL   RBC 5.07 4.40 - 5.90 MIL/uL   Hemoglobin 14.9 13.0 - 18.0 g/dL   HCT 04.5 40.9 - 81.1 %   MCV 87.4 80.0 - 100.0 fL   MCH 29.5 26.0 - 34.0 pg   MCHC 33.7 32.0 - 36.0 g/dL   RDW 91.4 78.2 - 95.6 %   Platelets 279 150 - 440 K/uL   Neutrophils Relative % 81 %   Neutro Abs 7.3 (H) 1.4 - 6.5 K/uL   Lymphocytes Relative 15 %   Lymphs Abs 1.4 1.0 - 3.6 K/uL   Monocytes Relative 4 %   Monocytes Absolute 0.4 0.2 - 1.0 K/uL   Eosinophils Relative 0 %   Eosinophils Absolute 0.0 0 - 0.7 K/uL   Basophils Relative 0 %   Basophils Absolute 0.0 0 - 0.1 K/uL  Urinalysis complete, with microscopic ([facility] only)  Result Value Ref Range   Color, Urine YELLOW (A) YELLOW   APPearance CLOUDY (A) CLEAR   Glucose, UA NEGATIVE NEGATIVE mg/dL   Bilirubin Urine NEGATIVE NEGATIVE   Ketones, ur NEGATIVE NEGATIVE mg/dL   Specific Gravity, Urine 1.023 1.005 - 1.030   Hgb urine dipstick 3+ (A) NEGATIVE   pH 5.0 5.0 - 8.0   Protein, ur 30 (A) NEGATIVE mg/dL   Nitrite NEGATIVE NEGATIVE   Leukocytes, UA NEGATIVE NEGATIVE   RBC / HPF TOO NUMEROUS TO COUNT 0 - 5 RBC/hpf   WBC, UA 6-30 0 - 5 WBC/hpf   Bacteria, UA NONE SEEN NONE SEEN   Squamous Epithelial / LPF 0-5 (A) NONE SEEN   Mucous PRESENT    Hyaline Casts, UA PRESENT       Assessment & Plan:   Problem List Items Addressed This Visit      Genitourinary   Kidney stone - Primary    Still has blood in his urine. Will restart his flomax. Will get him refill on pain medicine. Will start peeing through coffee filter and add some diet lemonade to his fluids. If hasn't passed it in 1 week, plan on re-x-raying to make sure  it's moving and determine if he needs to see uro.       Relevant Medications   oxyCODONE-acetaminophen (ROXICET) 5-325 MG per tablet   Other Relevant Orders  UA/M w/rflx Culture, Routine       Follow up plan: Return if symptoms worsen or fail to improve.

## 2014-11-25 LAB — URINE CULTURE, REFLEX

## 2014-11-26 LAB — UA/M W/RFLX CULTURE, ROUTINE

## 2014-12-05 ENCOUNTER — Telehealth: Payer: Self-pay | Admitting: Family Medicine

## 2014-12-05 NOTE — Telephone Encounter (Signed)
Forward to provider

## 2014-12-05 NOTE — Telephone Encounter (Signed)
Pt would like chantix to [facility]

## 2014-12-06 MED ORDER — VARENICLINE TARTRATE 1 MG PO TABS: 1.0000 mg | ORAL_TABLET | Freq: Two times a day (BID) | ORAL | Status: DC

## 2014-12-06 NOTE — Telephone Encounter (Signed)
I spoke with patient; he did start the starter pack and now needs the continuing month pack; he is smoking less; glad to hear; call if needed; Rx for 3-4 months

## 2015-03-28 ENCOUNTER — Ambulatory Visit: Payer: BLUE CROSS/BLUE SHIELD | Admitting: Family Medicine

## 2015-04-18 ENCOUNTER — Ambulatory Visit (INDEPENDENT_AMBULATORY_CARE_PROVIDER_SITE_OTHER): Payer: Managed Care, Other (non HMO) | Admitting: Family Medicine

## 2015-04-18 ENCOUNTER — Encounter: Payer: Self-pay | Admitting: Family Medicine

## 2015-04-18 VITALS — BP 142/101 | HR 79 | Temp 97.7°F | Ht 73.0 in | Wt 256.4 lb

## 2015-04-18 DIAGNOSIS — E785 Hyperlipidemia, unspecified: Secondary | ICD-10-CM | POA: Diagnosis not present

## 2015-04-18 DIAGNOSIS — I10 Essential (primary) hypertension: Secondary | ICD-10-CM

## 2015-04-18 DIAGNOSIS — I700 Atherosclerosis of aorta: Secondary | ICD-10-CM | POA: Diagnosis not present

## 2015-04-18 DIAGNOSIS — E669 Obesity, unspecified: Secondary | ICD-10-CM

## 2015-04-18 DIAGNOSIS — Z23 Encounter for immunization: Secondary | ICD-10-CM | POA: Diagnosis not present

## 2015-04-18 DIAGNOSIS — Z87442 Personal history of urinary calculi: Secondary | ICD-10-CM | POA: Diagnosis not present

## 2015-04-18 DIAGNOSIS — E119 Type 2 diabetes mellitus without complications: Secondary | ICD-10-CM

## 2015-04-18 DIAGNOSIS — N200 Calculus of kidney: Secondary | ICD-10-CM

## 2015-04-18 DIAGNOSIS — Z5181 Encounter for therapeutic drug level monitoring: Secondary | ICD-10-CM

## 2015-04-18 LAB — UA/M W/RFLX CULTURE, ROUTINE
Bilirubin, UA: NEGATIVE
Ketones, UA: NEGATIVE
Leukocytes, UA: NEGATIVE
Nitrite, UA: NEGATIVE
Protein, UA: NEGATIVE
RBC, UA: NEGATIVE
Specific Gravity, UA: >1.03 — ABNORMAL HIGH (ref 1.005–1.030)
Urobilinogen, Ur: 1 mg/dL (ref 0.2–1.0)
pH, UA: 5 (ref 5.0–7.5)

## 2015-04-18 MED ORDER — ATORVASTATIN CALCIUM 40 MG PO TABS: 40.0000 mg | ORAL_TABLET | Freq: Every day | ORAL | Status: DC

## 2015-04-18 MED ORDER — LISINOPRIL 10 MG PO TABS: 10.0000 mg | ORAL_TABLET | Freq: Every day | ORAL | Status: DC

## 2015-04-18 MED ORDER — METFORMIN HCL 500 MG PO TABS: 1000.0000 mg | ORAL_TABLET | Freq: Two times a day (BID) | ORAL | Status: DC

## 2015-04-18 NOTE — Assessment & Plan Note (Signed)
On aspirin; foot exam by MD; pt checking sugars daily but welcome to check less if desired; urine microalbumin today; flu shot given; eye exam encouraged

## 2015-04-18 NOTE — Assessment & Plan Note (Signed)
Continue aspirin, statin; work on getting lipids and sugar and BP under control; so very glad he quit smoking

## 2015-04-18 NOTE — Assessment & Plan Note (Signed)
Modest weight loss encouraged, but I'm giving him a pass today since he quit smoking; we'll work on the weight loss over time; see AVS

## 2015-04-18 NOTE — Assessment & Plan Note (Signed)
Urine checked today; hematuria has resolved; symptoms have resolved; glad he's drinking less soda, more water

## 2015-04-18 NOTE — Assessment & Plan Note (Signed)
Get back on blood pressure medicines and return to see CMA just for a BP check in 3 weeks; try the DASH guidelines

## 2015-04-18 NOTE — Assessment & Plan Note (Signed)
Check lipids today; goal LDL under 100; continue statin; limit saturated fats

## 2015-04-18 NOTE — Patient Instructions (Addendum)
I am SO proud of your for quitting smoking!!  Please do schedule an appointment to see your eye doctor, and have your eyes examined every year Check your feet every night and let me know right away of any sores, infections, numbness, etc. Try to limit sweets, white bread, white rice, white potatoes It is okay for you to not check your fingerstick blood sugars (per [employer] Best Practices)  You received the flu shot today; it should protect you against the flu virus over the coming months; it will take about two weeks for antibodies to develop; do try to stay away from hospitals, nursing homes, and daycares during peak flu season; taking extra vitamin C daily during flu season may help you avoid getting sick  Check out the information at familydoctor.org entitled "What It Takes to Lose Weight" Try to lose between 1-2 pounds per week by taking in fewer calories and burning off more calories You can succeed by limiting portions, limiting foods dense in calories and fat, becoming more active, and drinking 8 glasses of water a day (64 ounces) Don't skip meals, especially breakfast, as skipping meals may alter your metabolism Do not use over-the-counter weight loss pills or gimmicks that claim rapid weight loss A healthy BMI (or body mass index) is between 18.5 and 24.9 You can calculate your ideal BMI at the NIH website JobEconomics.hu  Your goal blood pressure is less than 130 mmHg on top. Try to follow the DASH guidelines (DASH stands for Dietary Approaches to Stop Hypertension) Try to limit the sodium in your diet.  Ideally, consume less than 1.5 grams (less than 1,500mg ) per day. Do not add salt when cooking or at the table.  Check the sodium amount on labels when shopping, and choose items lower in sodium when given a choice. Avoid or limit foods that already contain a lot of sodium. Eat a diet rich in fruits and  vegetables and whole grains.  We'll contact you about the lab results  Get back on your blood pressure medicine, and return to see Amy in 3 weeks and we'll make sure your blood pressure is where we want it to be  DASH Eating Plan DASH stands for "Dietary Approaches to Stop Hypertension." The DASH eating plan is a healthy eating plan that has been shown to reduce high blood pressure (hypertension). Additional health benefits may include reducing the risk of type 2 diabetes mellitus, heart disease, and stroke. The DASH eating plan may also help with weight loss. WHAT DO I NEED TO KNOW ABOUT THE DASH EATING PLAN? For the DASH eating plan, you will follow these general guidelines:  Choose foods with a percent daily value for sodium of less than 5% (as listed on the food label).  Use salt-free seasonings or herbs instead of table salt or sea salt.  Check with your health care provider or pharmacist before using salt substitutes.  Eat lower-sodium products, often labeled as "lower sodium" or "no salt added."  Eat fresh foods.  Eat more vegetables, fruits, and low-fat dairy products.  Choose whole grains. Look for the word "whole" as the first word in the ingredient list.  Choose fish and skinless chicken or [place] more often than red meat. Limit fish, poultry, and meat to 6 oz (170 g) each day.  Limit sweets, desserts, sugars, and sugary drinks.  Choose heart-healthy fats.  Limit cheese to 1 oz (28 g) per day.  Eat more home-cooked food and less restaurant, buffet, and  fast food.  Limit fried foods.  Cook foods using methods other than frying.  Limit canned vegetables. If you do use them, rinse them well to decrease the sodium.  When eating at a restaurant, ask that your food be prepared with less salt, or no salt if possible. WHAT FOODS CAN I EAT? Seek help from a dietitian for individual calorie needs. Grains Whole grain or whole wheat bread. [place] rice. Whole grain or whole  wheat pasta. Quinoa, bulgur, and whole grain cereals. Low-sodium cereals. Corn or whole wheat flour tortillas. Whole grain cornbread. Whole grain crackers. Low-sodium crackers. Vegetables Fresh or frozen vegetables (raw, steamed, roasted, or grilled). Low-sodium or reduced-sodium tomato and vegetable juices. Low-sodium or reduced-sodium tomato sauce and paste. Low-sodium or reduced-sodium canned vegetables.  Fruits All fresh, canned (in natural juice), or frozen fruits. Meat and Other Protein Products Ground beef (85% or leaner), grass-fed beef, or beef trimmed of fat. Skinless chicken or [place]. Ground chicken or [place]. Pork trimmed of fat. All fish and seafood. Eggs. Dried beans, peas, or lentils. Unsalted nuts and seeds. Unsalted canned beans. Dairy Low-fat dairy products, such as skim or 1% milk, 2% or reduced-fat cheeses, low-fat ricotta or cottage cheese, or plain low-fat yogurt. Low-sodium or reduced-sodium cheeses. Fats and Oils Tub margarines without trans fats. Light or reduced-fat mayonnaise and salad dressings (reduced sodium). Avocado. Safflower, olive, or canola oils. Natural peanut or almond butter. Other Unsalted popcorn and pretzels. The items listed above may not be a complete list of recommended foods or beverages. Contact your dietitian for more options. WHAT FOODS ARE NOT RECOMMENDED? Grains White bread. White pasta. White rice. Refined cornbread. Bagels and croissants. Crackers that contain trans fat. Vegetables Creamed or fried vegetables. Vegetables in a cheese sauce. Regular canned vegetables. Regular canned tomato sauce and paste. Regular tomato and vegetable juices. Fruits Dried fruits. Canned fruit in light or heavy syrup. Fruit juice. Meat and Other Protein Products Fatty cuts of meat. Ribs, chicken wings, bacon, sausage, bologna, salami, chitterlings, fatback, hot dogs, bratwurst, and packaged luncheon meats. Salted nuts and seeds. Canned beans with  salt. Dairy Whole or 2% milk, cream, half-and-half, and cream cheese. Whole-fat or sweetened yogurt. Full-fat cheeses or blue cheese. Nondairy creamers and whipped toppings. Processed cheese, cheese spreads, or cheese curds. Condiments Onion and garlic salt, seasoned salt, table salt, and sea salt. Canned and packaged gravies. Worcestershire sauce. Tartar sauce. Barbecue sauce. Teriyaki sauce. Soy sauce, including reduced sodium. Steak sauce. Fish sauce. Oyster sauce. Cocktail sauce. Horseradish. Ketchup and mustard. Meat flavorings and tenderizers. Bouillon cubes. Hot sauce. Tabasco sauce. Marinades. Taco seasonings. Relishes. Fats and Oils Butter, stick margarine, lard, shortening, ghee, and bacon fat. Coconut, palm kernel, or palm oils. Regular salad dressings. Other Pickles and olives. Salted popcorn and pretzels. The items listed above may not be a complete list of foods and beverages to avoid. Contact your dietitian for more information. WHERE CAN I FIND MORE INFORMATION? National Heart, Lung, and Blood Institute: CablePromo.it   This information is not intended to replace advice given to you by your health care provider. Make sure you discuss any questions you have with your health care provider.   Document Released: 03/04/2011 Document Revised: 04/05/2014 Document Reviewed: 01/17/2013 Elsevier Interactive Patient Education [employer]

## 2015-04-18 NOTE — Progress Notes (Signed)
BP 142/101 mmHg  Pulse 79  Temp(Src) 97.7 F (36.5 C)  Ht  (1.854 m)  Wt 256 lb 6.4 oz (116.302 kg)  BMI 33.84 kg/m2  SpO2 94%   Subjective:    Patient ID: Nathan Rojas, male    DOB: 1975/05/19, 40 y.o.   MRN: [id number]  HPI: Nathan Rojas is a 40 y.o. male  Chief Complaint  Patient presents with  . Follow-up  . Nephrolithiasis    Patient states he's doing much better. No pain in a while.   . Medication Refill    Needs refill on Lipitor   Diabetes; checking sugars and highest in the last 4 months was 190; checks almost every morning; no dry mouth or excessive; no foot numbness or sores; came fsating for labs  Hx of kidney stones; those do not run in the family; he had not been dehydrated; he quit drinking as much diet [facility] any more, just one a day; more water; no more symptoms  High cholesterol; trying to limit saturated fats; more baked or grilled or fried; might eat five eggs a week; taking statin without any problems  High blood pressure; had pizza for dinner last night; not checking BP away from office; out of blood pressure medicine for a while  Last cigarette was 3 months ago; quit with Chantix; can breathe better and can smell things now; wife still smokes  Relevant past medical, surgical, family and social history reviewed and updated as indicated. Interim medical history since our last visit reviewed. Allergies and medications reviewed and updated.  Review of Systems  Respiratory: Negative for shortness of breath.   Cardiovascular: Negative for chest pain.  Neurological: Negative for numbness.  Per HPI unless specifically indicated above     Objective:    BP 142/101 mmHg  Pulse 79  Temp(Src) 97.7 F (36.5 C)  Ht  (1.854 m)  Wt 256 lb 6.4 oz (116.302 kg)  BMI 33.84 kg/m2  SpO2 94%  Wt Readings from Last 3 Encounters:  04/18/15 256 lb 6.4 oz (116.302 kg)  11/22/14 245 lb (111.131 kg)  11/15/14 250 lb (113.399 kg)    Physical Exam   Constitutional: He appears well-developed and well-nourished. No distress.  Obese  HENT:  Head: Normocephalic and atraumatic.  Eyes: EOM are normal. No scleral icterus.  Neck: No thyromegaly present.  Cardiovascular: Normal rate and regular rhythm.   Pulmonary/Chest: Effort normal and breath sounds normal.  Abdominal: Soft. Bowel sounds are normal. He exhibits no distension. There is no hepatomegaly. There is no tenderness.  Musculoskeletal: He exhibits no edema.  Neurological: Coordination normal.  Skin: Skin is warm and dry. No pallor.  Psychiatric: He has a normal mood and affect. His behavior is normal. Judgment and thought content normal.   Diabetic Foot Form - Detailed   Diabetic Foot Exam - detailed  Diabetic Foot exam was performed with the following findings:  Yes 04/18/2015  9:36 AM  Visual Foot Exam completed.:  Yes  Is there a history of foot ulcer?:  No  Are the toenails long?:  No  Are the toenails thick?:  No  Are the toenails ingrown?:  No  Normal Range of Motion:  Yes    Pulse Foot Exam completed.:  Yes  Right Dorsalis Pedis:  Present Left Dorsalis Pedis:  Present  Sensory Foot Exam Completed.:  Yes  Swelling:  No  Semmes-Weinstein Monofilament Test  R Site 1-Great Toe:  Pos L Site 1-Great Toe:  Pos  R Site 4:  Pos L Site 4:  Pos  R Site 5:  Pos L Site 5:  Pos       Results for orders placed or performed in visit on 11/22/14  Microscopic Examination  Result Value Ref Range   WBC, UA 6-10 (A) 0 -  5 /hpf   RBC, UA 3-10 (A) 0 -  2 /hpf   Epithelial Cells (non renal) 0-10 0 - 10 /hpf   Casts Present (A) None seen /lpf   Cast Type Hyaline casts N/A   Crystals Present (A) N/A   Crystal Type Amorphous Sediment N/A   Mucus, UA Present Not Estab.   Bacteria, UA Few None seen/Few  UA/M w/rflx Culture, Routine  Result Value Ref Range   Urine Culture, Routine Final report    Urine Culture result 1 Comment   Urine Culture, Routine  Result Value Ref Range    Urine Culture result 1 Comment    Urine today: 2+ glucose Negative blood     Assessment & Plan:   Problem List Items Addressed This Visit      Cardiovascular and Mediastinum   Hypertension    Get back on blood pressure medicines and return to see CMA just for a BP check in 3 weeks; try the DASH guidelines      Relevant Medications   lisinopril (PRINIVIL,ZESTRIL) 10 MG tablet   atorvastatin (LIPITOR) 40 MG tablet   Abdominal aortic atherosclerosis (HCC)    Continue aspirin, statin; work on getting lipids and sugar and BP under control; so very glad he quit smoking      Relevant Medications   lisinopril (PRINIVIL,ZESTRIL) 10 MG tablet   atorvastatin (LIPITOR) 40 MG tablet     Endocrine   Diabetes mellitus without complication (HCC) - Primary    On aspirin; foot exam by MD; pt checking sugars daily but welcome to check less if desired; urine microalbumin today; flu shot given; eye exam encouraged      Relevant Medications   lisinopril (PRINIVIL,ZESTRIL) 10 MG tablet   atorvastatin (LIPITOR) 40 MG tablet   metFORMIN (GLUCOPHAGE) 500 MG tablet   Other Relevant Orders   Hgb A1c w/o eAG   Microalbumin / creatinine urine ratio     Genitourinary   Kidney stone    Urine checked today; hematuria has resolved; symptoms have resolved; glad he's drinking less soda, more water        Other   Hyperlipidemia    Check lipids today; goal LDL under 100; continue statin; limit saturated fats      Relevant Medications   lisinopril (PRINIVIL,ZESTRIL) 10 MG tablet   atorvastatin (LIPITOR) 40 MG tablet   Other Relevant Orders   Lipid Panel w/o Chol/HDL Ratio   Medication monitoring encounter   Relevant Orders   Comprehensive metabolic panel   Obesity    Modest weight loss encouraged, but I'm giving him a pass today since he quit smoking; we'll work on the weight loss over time; see AVS      Relevant Medications   metFORMIN (GLUCOPHAGE) 500 MG tablet    Other Visit Diagnoses     History of kidney stones        Relevant Orders    UA/M w/rflx Culture, Routine    Need for influenza vaccination        Relevant Orders    Flu Vaccine QUAD 36+ mos PF IM (Fluarix & Fluzone Quad PF) (Completed)  Follow up plan: Return in about 6 months (around 10/16/2015) for thirty minute follow-up with fasting labs; RTC 3 weeks with CMA for BP.   Orders Placed This Encounter  Procedures  . Flu Vaccine QUAD 36+ mos PF IM (Fluarix & Fluzone Quad PF)  . UA/M w/rflx Culture, Routine  . Hgb A1c w/o eAG  . Lipid Panel w/o Chol/HDL Ratio  . Comprehensive metabolic panel  . Microalbumin / creatinine urine ratio   An after-visit summary was printed and given to the patient at check-out.  Please see the patient instructions which may contain other information and recommendations beyond what is mentioned above in the assessment and plan.  Meds ordered this encounter  Medications  . penicillin v potassium (VEETID) 500 MG tablet    Sig:   . lisinopril (PRINIVIL,ZESTRIL) 10 MG tablet    Sig: Take 1 tablet (10 mg total) by mouth daily.    Dispense:  90 tablet    Refill:  1  . atorvastatin (LIPITOR) 40 MG tablet    Sig: Take 1 tablet (40 mg total) by mouth at bedtime.    Dispense:  90 tablet    Refill:  1  . metFORMIN (GLUCOPHAGE) 500 MG tablet    Sig: Take 2 tablets (1,000 mg total) by mouth 2 (two) times daily. Best if taken 15-30 minutes prior to morning and evening meals    Dispense:  360 tablet    Refill:  1   Medications Discontinued During This Encounter  Medication Reason  . naproxen (NAPROSYN) 500 MG tablet Discontinued by provider  . oxyCODONE-acetaminophen (ROXICET) 5-325 MG per tablet Completed Course  . ciprofloxacin (CIPRO) 500 MG tablet Completed Course  . tamsulosin (FLOMAX) 0.4 MG CAPS capsule Completed Course  . ondansetron (ZOFRAN) 4 MG tablet Completed Course  . lisinopril (PRINIVIL,ZESTRIL) 10 MG tablet Reorder  . atorvastatin (LIPITOR) 40 MG tablet  Reorder  . metFORMIN (GLUCOPHAGE) 500 MG tablet Reorder

## 2015-04-19 LAB — COMPREHENSIVE METABOLIC PANEL
ALT: 42 IU/L (ref 0–44)
AST: 27 IU/L (ref 0–40)
Albumin/Globulin Ratio: 1.8 (ref 1.1–2.5)
Albumin: 4.6 g/dL (ref 3.5–5.5)
Alkaline Phosphatase: 58 IU/L (ref 39–117)
BUN/Creatinine Ratio: 19 (ref 9–20)
BUN: 18 mg/dL (ref 6–24)
Bilirubin Total: 0.9 mg/dL (ref 0.0–1.2)
CO2: 22 mmol/L (ref 18–29)
Calcium: 9.5 mg/dL (ref 8.7–10.2)
Chloride: 100 mmol/L (ref 96–106)
Creatinine, Ser: 0.93 mg/dL (ref 0.76–1.27)
GFR calc Af Amer: 117 mL/min/{1.73_m2} (ref >59)
GFR calc non Af Amer: 102 mL/min/{1.73_m2} (ref >59)
Globulin, Total: 2.5 g/dL (ref 1.5–4.5)
Glucose: 235 mg/dL — ABNORMAL HIGH (ref 65–99)
Potassium: 4.6 mmol/L (ref 3.5–5.2)
Sodium: 137 mmol/L (ref 134–144)
Total Protein: 7.1 g/dL (ref 6.0–8.5)

## 2015-04-19 LAB — LIPID PANEL W/O CHOL/HDL RATIO
Cholesterol, Total: 130 mg/dL (ref 100–199)
HDL: 19 mg/dL — ABNORMAL LOW (ref >39)
LDL Calculated: 50 mg/dL (ref 0–99)
Triglycerides: 306 mg/dL — ABNORMAL HIGH (ref 0–149)
VLDL Cholesterol Cal: 61 mg/dL — ABNORMAL HIGH (ref 5–40)

## 2015-04-19 LAB — MICROALBUMIN / CREATININE URINE RATIO
Creatinine, Urine: 161.1 mg/dL
MICROALB/CREAT RATIO: 10.6 mg/g creat (ref 0.0–30.0)
Microalbumin, Urine: 17.1 ug/mL

## 2015-04-19 LAB — HGB A1C W/O EAG: Hgb A1c MFr Bld: 10.2 % — ABNORMAL HIGH (ref 4.8–5.6)

## 2015-04-22 ENCOUNTER — Telehealth: Payer: Self-pay | Admitting: Family Medicine

## 2015-04-22 MED ORDER — CANAGLIFLOZIN 100 MG PO TABS: 100.0000 mg | ORAL_TABLET | Freq: Every day | ORAL | Status: DC

## 2015-04-22 NOTE — Telephone Encounter (Signed)
Please let patient know that his A1c has really climbed; I would like to add another medicine to his regimen and then see him back in 4 weeks to recheck glucose, electrolytes, BP Start new medicine and call with any problems This is in addition to the other meds he takes We'll also have him start krill oil BID

## 2015-04-23 NOTE — Telephone Encounter (Signed)
Patient notified, follow up appointment scheduled. 

## 2015-04-30 ENCOUNTER — Encounter: Payer: Self-pay | Admitting: *Deleted

## 2015-04-30 ENCOUNTER — Emergency Department
Admission: EM | Admit: 2015-04-30 | Discharge: 2015-04-30 | Disposition: A | Discharge status: Home / Self Care | Payer: 59 | Attending: Emergency Medicine | Admitting: Emergency Medicine

## 2015-04-30 DIAGNOSIS — Z7982 Long term (current) use of aspirin: Secondary | ICD-10-CM | POA: Diagnosis not present

## 2015-04-30 DIAGNOSIS — Z792 Long term (current) use of antibiotics: Secondary | ICD-10-CM | POA: Insufficient documentation

## 2015-04-30 DIAGNOSIS — Z7984 Long term (current) use of oral hypoglycemic drugs: Secondary | ICD-10-CM | POA: Insufficient documentation

## 2015-04-30 DIAGNOSIS — Z79899 Other long term (current) drug therapy: Secondary | ICD-10-CM | POA: Diagnosis not present

## 2015-04-30 DIAGNOSIS — I129 Hypertensive chronic kidney disease with stage 1 through stage 4 chronic kidney disease, or unspecified chronic kidney disease: Secondary | ICD-10-CM | POA: Diagnosis not present

## 2015-04-30 DIAGNOSIS — Z87891 Personal history of nicotine dependence: Secondary | ICD-10-CM | POA: Insufficient documentation

## 2015-04-30 DIAGNOSIS — K08409 Partial loss of teeth, unspecified cause, unspecified class: Secondary | ICD-10-CM | POA: Insufficient documentation

## 2015-04-30 DIAGNOSIS — E119 Type 2 diabetes mellitus without complications: Secondary | ICD-10-CM | POA: Diagnosis not present

## 2015-04-30 DIAGNOSIS — N189 Chronic kidney disease, unspecified: Secondary | ICD-10-CM | POA: Diagnosis not present

## 2015-04-30 DIAGNOSIS — R51 Headache: Secondary | ICD-10-CM | POA: Diagnosis present

## 2015-04-30 DIAGNOSIS — G500 Trigeminal neuralgia: Secondary | ICD-10-CM | POA: Diagnosis not present

## 2015-04-30 LAB — CBC
HCT: 44.6 % (ref 40.0–52.0)
Hemoglobin: 15.5 g/dL (ref 13.0–18.0)
MCH: 29.3 pg (ref 26.0–34.0)
MCHC: 34.8 g/dL (ref 32.0–36.0)
MCV: 84.2 fL (ref 80.0–100.0)
Platelets: 282 10*3/uL (ref 150–440)
RBC: 5.29 MIL/uL (ref 4.40–5.90)
RDW: 12.7 % (ref 11.5–14.5)
WBC: 7.2 10*3/uL (ref 3.8–10.6)

## 2015-04-30 LAB — BASIC METABOLIC PANEL
Anion gap: 11 (ref 5–15)
BUN: 25 mg/dL — ABNORMAL HIGH (ref 6–20)
CO2: 24 mmol/L (ref 22–32)
Calcium: 10 mg/dL (ref 8.9–10.3)
Chloride: 100 mmol/L — ABNORMAL LOW (ref 101–111)
Creatinine, Ser: 0.98 mg/dL (ref 0.61–1.24)
GFR calc Af Amer: >60 mL/min (ref >60)
GFR calc non Af Amer: >60 mL/min (ref >60)
Glucose, Bld: 147 mg/dL — ABNORMAL HIGH (ref 65–99)
Potassium: 4.4 mmol/L (ref 3.5–5.1)
Sodium: 135 mmol/L (ref 135–145)

## 2015-04-30 MED ORDER — CARBAMAZEPINE ER 100 MG PO TB12: 100.0000 mg | ORAL_TABLET | Freq: Two times a day (BID) | ORAL | Status: DC

## 2015-04-30 NOTE — ED Notes (Signed)
Per dr Derrill Kay, no ct scan at this time.

## 2015-04-30 NOTE — Discharge Instructions (Signed)
Please return to the emergency department if you develop headache, visual changes, numbness weakness or tickling, or any other symptoms concerning to you.  Trigeminal Neuralgia Trigeminal neuralgia is a nerve disorder that causes attacks of severe facial pain. The attacks last from a few seconds to several minutes. They can happen for days, weeks, or months and then go away for months or years. Trigeminal neuralgia is also called tic douloureux. CAUSES This condition is caused by damage to a nerve in the face that is called the trigeminal nerve. An attack can be triggered by:  Talking.  Chewing.  Putting on makeup.  Washing your face.  Shaving your face.  Brushing your teeth.  Touching your face. RISK FACTORS This condition is more likely to develop in:  Women.  People who are 90 years of age or older. SYMPTOMS The main symptom of this condition is pain in the jaw, lips, eyes, nose, scalp, forehead, and face. The pain may be intense, stabbing, electric, or shock-like. DIAGNOSIS This condition is diagnosed with a physical exam. A CT scan or MRI may be done to rule out other conditions that can cause facial pain. TREATMENT This condition may be treated with:  Avoiding the things that trigger your attacks.  Pain medicine.  Surgery. This may be done in severe cases if other medical treatment does not provide relief. HOME CARE INSTRUCTIONS  Take over-the-counter and prescription medicines only as told by your health care provider.  If you wish to get pregnant, talk with your health care provider before you start trying to get pregnant.  Avoid the things that trigger your attacks. It may help to:  Chew on the unaffected side of your mouth.  Avoid touching your face.  Avoid blasts of hot or cold air. SEEK MEDICAL CARE IF:  Your pain medicine is not helping.  You develop new, unexplained symptoms, such as:  Double vision.  Facial weakness.  Changes in hearing or  balance.  You become pregnant. SEEK IMMEDIATE MEDICAL CARE IF:  Your pain is unbearable, and your pain medicine does not help.   This information is not intended to replace advice given to you by your health care provider. Make sure you discuss any questions you have with your health care provider.   Document Released: 03/12/2000 Document Revised: 12/04/2014 Document Reviewed: 07/08/2014 Elsevier Interactive Patient Education [employer]

## 2015-04-30 NOTE — ED Notes (Signed)
Pt reports intermittent sharp pain in left side of head for 4 days.  No n/v/d.  No photophobia. No diff ambulating, no dizziness.  Pt alert.  Speech clear.  Pt taking otc meds without relief.

## 2015-04-30 NOTE — ED Provider Notes (Signed)
[facility] Emergency Department Provider Note  ____________________________________________  Time seen: Approximately 7:33 PM  I have reviewed the triage vital signs and the nursing notes.   HISTORY  Chief Complaint Headache    HPI Nathan Rojas is a 40 y.o. male with a history of recent tooth extraction 6 days ago presenting with sharp shooting pains over the left scalp that started 5 days ago. The patient reports a 7/10 pain that is sharp and shooting "like an ice pick" on the left side of the scalp but not in the face. He has no temple pain, visual changes, headache, numbness tickling or weakness, or recent trauma. No fever or neck pain.No improvement with Tylenol and Motrin.   Past Medical History  Diagnosis Date  . Diabetes mellitus without complication (HCC)   . Hypertension   . Hyperlipidemia   . CKD (chronic kidney disease)     Patient Active Problem List   Diagnosis Date Noted  . Medication monitoring encounter 04/18/2015  . Obesity 04/18/2015  . Kidney stone 11/22/2014  . Abdominal aortic atherosclerosis (HCC) 11/16/2014  . Diabetes mellitus without complication (HCC)   . Hypertension   . Hyperlipidemia   . CKD (chronic kidney disease)     Past Surgical History  Procedure Laterality Date  . Tonsillectomy    . Appendectomy    . Vasectomy  Jan 2015    Current Outpatient Rx  Name  Route  Sig  Dispense  Refill  . aspirin EC 81 MG tablet   Oral   Take 81 mg by mouth daily.         Marland Kitchen atorvastatin (LIPITOR) 40 MG tablet   Oral   Take 1 tablet (40 mg total) by mouth at bedtime.   90 tablet   1   . canagliflozin (INVOKANA) 100 MG TABS tablet   Oral   Take 1 tablet (100 mg total) by mouth daily before breakfast. For diabetes   30 tablet   0   . carbamazepine (TEGRETOL-XR) 100 MG 12 hr tablet   Oral   Take 1 tablet (100 mg total) by mouth 2 (two) times daily.   60 tablet   0   . lisinopril (PRINIVIL,ZESTRIL) 10 MG  tablet   Oral   Take 1 tablet (10 mg total) by mouth daily.   90 tablet   1   . metFORMIN (GLUCOPHAGE) 500 MG tablet   Oral   Take 2 tablets (1,000 mg total) by mouth 2 (two) times daily. Best if taken 15-30 minutes prior to morning and evening meals   360 tablet   1   . penicillin v potassium (VEETID) 500 MG tablet               . varenicline (CHANTIX CONTINUING MONTH PAK) 1 MG tablet   Oral   Take 1 tablet (1 mg total) by mouth 2 (two) times daily.   60 tablet   3     Allergies Review of patient's allergies indicates no known allergies.  Family History  Problem Relation Age of Onset  . Diabetes Sister     type 1  . Dementia Maternal Grandmother   . AAA (abdominal aortic aneurysm) Maternal Grandfather   . Hypertension Maternal Grandfather     Social History Social History  Substance Use Topics  . Smoking status: Former Smoker -- 0.50 packs/day    Types: Cigarettes    Quit date: 02/08/2015  . Smokeless tobacco: Never Used  . Alcohol Use: No  Review of Systems Constitutional: No fever/chills. No lightheadedness or syncope. Eyes: No visual changes. No blurred or double vision. HEAD: Shooting pain left scalp ENT: No sore throat. Diffuse poor dentition in the mouth with multiple missing teeth. In the left upper gum, there is a more recent extraction with minimal swelling but no evidence of abscess. The patient does not have any facial swelling, or trismus. Cardiovascular: Denies chest pain, palpitations. Respiratory: Denies shortness of breath.  No cough. Gastrointestinal: No abdominal pain.  No nausea, no vomiting.  No diarrhea.  No constipation.  Musculoskeletal: Negative for back pain. Skin: Negative for rash. Neurological: Negative for headaches. No changes in mental status. No difficulty walking. No numbness weakness or tingling.  10-point ROS otherwise negative.  ____________________________________________   PHYSICAL EXAM:  VITAL SIGNS: ED  Triage Vitals  Enc Vitals Group     BP 04/30/15 1737 136/89 mmHg     Pulse Rate 04/30/15 1737 98     Resp 04/30/15 1737 20     Temp 04/30/15 1737 97.8 F (36.6 C)     Temp Source 04/30/15 1737 Oral     SpO2 04/30/15 1737 99 %     Weight 04/30/15 1737 260 lb (117.935 kg)     Height 04/30/15 1737  (1.88 m)     Head Cir --      Peak Flow --      Pain Score 04/30/15 1737 5     Pain Loc --      Pain Edu? --      Excl. in GC? --      Constitutional: Alert and oriented. Well appearing and in no acute distress. Answer question appropriately. Eyes: Conjunctivae are normal.  EOMI. PERRLA. Head: Atraumatic. Mild tenderness to palpation without any swelling, erythema or skin disruption over the left lateral scalp. No pain over the temples bilaterally. EARS: Left TM without bulge, erythema or fluid. Canals clear. Nose: No congestion/rhinnorhea. Mouth/Throat: Mucous membranes are moist.  Neck: No stridor.  Supple.  No midline C-spine tenderness to palpation, step-offs or deformities. Cardiovascular: Normal rate, regular rhythm. No murmurs, rubs or gallops.  Respiratory: Normal respiratory effort.  No retractions. Lungs CTAB.  No wheezes, rales or ronchi. Musculoskeletal: No LE edema.  Neurologic: Alert and oriented 3. Speech is clear. Naming and repetition are intact. Face and smile symmetric. Tongue is midline. EOMI and PERRLA. Normal cheek puff. No pronator drift. 5 out of 5 grip, biceps, triceps, hip flexors, plantar flexion and dorsiflexion. Normal sensation to light touch in the bilateral upper and lower extremities, and face. Normal gait without ataxia. Skin:  Skin is warm, dry and intact. No rash noted. Psychiatric: Mood and affect are normal. Speech and behavior are normal.  Normal judgement.  ____________________________________________   LABS (all labs ordered are listed, but only abnormal results are displayed)  Labs Reviewed  BASIC METABOLIC PANEL - Abnormal; Notable for  the following:    Chloride 100 (*)    Glucose, Bld 147 (*)    BUN 25 (*)    All other components within normal limits  CBC   ____________________________________________  EKG  Not indicated ____________________________________________  RADIOLOGY  No results found.  ____________________________________________   PROCEDURES  Procedure(s) performed: None  Critical Care performed: No ____________________________________________   INITIAL IMPRESSION / ASSESSMENT AND PLAN / ED COURSE  Pertinent labs & imaging results that were available during my care of the patient were reviewed by me and considered in my medical decision making (see chart for  details).  40 y.o. male with recent tooth extraction now having sharp shooting pains to the left side of the face. The patient does not have any other neurologic symptoms. The patient does not have any facial involvement, however this could be related to trigeminal neuralgia or neuralgia of another scalp nerve. He does not have pain over the temples or changes in vision, so temporal arteritis is much less likely. He has no history of trauma so intracranial injury or skull fracture is unlikely. The patient has tried Tylenol and Motrin for pain, so we'll start him on carbamazepine and have him follow-up with his regular doctor to see if this is helping. The patient understands return precautions as well as follow-up instructions.  ____________________________________________  FINAL CLINICAL IMPRESSION(S) / ED DIAGNOSES  Final diagnoses:  Trigeminal neuralgia of left side of face      NEW MEDICATIONS STARTED DURING THIS VISIT:  New Prescriptions   CARBAMAZEPINE (TEGRETOL-XR) 100 MG 12 HR TABLET    Take 1 tablet (100 mg total) by mouth 2 (two) times daily.     Rockne Menghini, MD 04/30/15 [phone]

## 2015-05-09 ENCOUNTER — Ambulatory Visit: Payer: Managed Care, Other (non HMO)

## 2015-05-09 VITALS — BP 124/76 | HR 77

## 2015-05-09 DIAGNOSIS — I10 Essential (primary) hypertension: Secondary | ICD-10-CM

## 2015-05-09 NOTE — Patient Instructions (Signed)
Patient came in for BP check, no complaints

## 2015-05-23 ENCOUNTER — Ambulatory Visit (INDEPENDENT_AMBULATORY_CARE_PROVIDER_SITE_OTHER): Payer: Managed Care, Other (non HMO) | Admitting: Family Medicine

## 2015-05-23 ENCOUNTER — Encounter: Payer: Self-pay | Admitting: Family Medicine

## 2015-05-23 VITALS — BP 119/79 | HR 71 | Temp 96.8°F | Ht 72.0 in | Wt 248.6 lb

## 2015-05-23 DIAGNOSIS — E119 Type 2 diabetes mellitus without complications: Secondary | ICD-10-CM | POA: Diagnosis not present

## 2015-05-23 DIAGNOSIS — Z5181 Encounter for therapeutic drug level monitoring: Secondary | ICD-10-CM

## 2015-05-23 DIAGNOSIS — I10 Essential (primary) hypertension: Secondary | ICD-10-CM

## 2015-05-23 DIAGNOSIS — G500 Trigeminal neuralgia: Secondary | ICD-10-CM | POA: Diagnosis not present

## 2015-05-23 NOTE — Assessment & Plan Note (Signed)
Check BMP .  

## 2015-05-23 NOTE — Progress Notes (Signed)
BP 119/79 mmHg  Pulse 71  Temp(Src) 96.8 F (36 C)  Ht 6' (1.829 m)  Wt 248 lb 9.6 oz (112.764 kg)  BMI 33.71 kg/m2  SpO2 95%   Subjective:    Patient ID: Nathan Rojas, male    DOB: [birth year], 40 y.o.   MRN: [id number]  HPI: Nathan Rojas is a 40 y.o. male  Chief Complaint  Patient presents with  . Follow-up    Patient was in ER for pain in left side of head. Was told that it was a messed up nerve and that's why his eye twitches etc.   . Medication Refill   He is losing weight on purpose, cut out fried foods and potatoes and corn and hot dogs and breads; does not want insulin; really motivated to get his diabetes under control; reviewed last two A1c levels and his A1c had really jumped this last time; he would like to lose another 20-30 pounds; no problems with the feet; no numbness or sores  Blood pressure is well-controlled; taking medicine  He went to the ER and was diagnosed with trigeminal neuralgia, started on CBZ and that is helping; had a molar on upper left side, and then symptoms started a week later   Relevant past medical history reviewed  Interim medical history since our last visit reviewed. Allergies and medications reviewed and updated.  Review of Systems Per HPI unless specifically indicated above     Objective:    BP 119/79 mmHg  Pulse 71  Temp(Src) 96.8 F (36 C)  Ht 6' (1.829 m)  Wt 248 lb 9.6 oz (112.764 kg)  BMI 33.71 kg/m2  SpO2 95%  Wt Readings from Last 3 Encounters:  05/23/15 248 lb 9.6 oz (112.764 kg)  04/30/15 260 lb (117.935 kg)  04/18/15 256 lb 6.4 oz (116.302 kg)    Physical Exam  Constitutional: He appears well-developed and well-nourished.  Weight down 11+ pounds since last visit  HENT:  Head: Normocephalic and atraumatic.  Right Ear: External ear normal.  Left Ear: External ear normal.  Eyes: EOM are normal. Right eye exhibits no discharge. Left eye exhibits no discharge. No scleral icterus.  Neck: No JVD present.   Cardiovascular: Normal rate.   Pulmonary/Chest: Effort normal and breath sounds normal.  Psychiatric: He has a normal mood and affect.      Assessment & Plan:   Problem List Items Addressed This Visit      Cardiovascular and Mediastinum   Hypertension - Primary    Much improved; continue current efforts at weight loss, healthier eating; check BMP today        Endocrine   Diabetes mellitus without complication (HCC)    He is motivated to bring weight and sugars down; encouragement given; return for labs around April 20th        Nervous and Auditory   Trigeminal neuralgia    On carbamazepine; tolerating well        Other   Medication monitoring encounter    Check BMP      Relevant Orders   Basic metabolic panel (Completed)       Follow up plan: Return in about 8 weeks (around 07/17/2015), or or just after, for thirty minute follow-up with fasting labs.  An after-visit summary was printed and given to the patient at check-out.  Please see the patient instructions which may contain other information and recommendations beyond what is mentioned above in the assessment and plan.

## 2015-05-23 NOTE — Patient Instructions (Signed)
I'll be moving to another practice, [facility], on June 30, 2015 I'll be here until June 27, 2015 You are welcome to either follow me there for ongoing care, or you can stay here at [facility] and see Dr. Olevia Perches or our nurse practitioner Gabriel Cirri  Return for fasting labs and visit in April and we are looking for big changes

## 2015-05-24 ENCOUNTER — Encounter: Payer: Self-pay | Admitting: Family Medicine

## 2015-05-24 LAB — BASIC METABOLIC PANEL
BUN/Creatinine Ratio: 18 (ref 9–20)
BUN: 17 mg/dL (ref 6–24)
CO2: 23 mmol/L (ref 18–29)
Calcium: 9.4 mg/dL (ref 8.7–10.2)
Chloride: 99 mmol/L (ref 96–106)
Creatinine, Ser: 0.92 mg/dL (ref 0.76–1.27)
GFR calc Af Amer: 119 mL/min/{1.73_m2} (ref >59)
GFR calc non Af Amer: 103 mL/min/{1.73_m2} (ref >59)
Glucose: 117 mg/dL — ABNORMAL HIGH (ref 65–99)
Potassium: 4.7 mmol/L (ref 3.5–5.2)
Sodium: 138 mmol/L (ref 134–144)

## 2015-06-03 ENCOUNTER — Encounter: Payer: Self-pay | Admitting: Family Medicine

## 2015-06-03 DIAGNOSIS — G500 Trigeminal neuralgia: Secondary | ICD-10-CM

## 2015-06-03 HISTORY — DX: Trigeminal neuralgia: G50.0

## 2015-06-03 NOTE — Assessment & Plan Note (Signed)
Much improved; continue current efforts at weight loss, healthier eating; check BMP today

## 2015-06-03 NOTE — Assessment & Plan Note (Signed)
He is motivated to bring weight and sugars down; encouragement given; return for labs around April 20th

## 2015-06-03 NOTE — Assessment & Plan Note (Signed)
On carbamazepine; tolerating well

## 2015-06-05 ENCOUNTER — Other Ambulatory Visit: Payer: Self-pay

## 2015-06-05 MED ORDER — LISINOPRIL 10 MG PO TABS: 10.0000 mg | ORAL_TABLET | Freq: Every day | ORAL | Status: DC

## 2015-06-05 MED ORDER — METFORMIN HCL 500 MG PO TABS: 1000.0000 mg | ORAL_TABLET | Freq: Two times a day (BID) | ORAL | Status: DC

## 2015-06-05 MED ORDER — ATORVASTATIN CALCIUM 40 MG PO TABS: 40.0000 mg | ORAL_TABLET | Freq: Every day | ORAL | Status: DC

## 2015-06-05 NOTE — Telephone Encounter (Signed)
He needs 90 day supply on meds sent to mail order. They were sent to his local pharmacy.

## 2015-06-05 NOTE — Telephone Encounter (Signed)
New Rxs sent In the future, I am always happy for you to just cancel the ones to the local pharmacy and call in new to the mail order to avoid duplication

## 2015-09-05 ENCOUNTER — Ambulatory Visit (INDEPENDENT_AMBULATORY_CARE_PROVIDER_SITE_OTHER): Payer: Managed Care, Other (non HMO) | Admitting: Family Medicine

## 2015-09-05 ENCOUNTER — Encounter: Payer: Self-pay | Admitting: Family Medicine

## 2015-09-05 VITALS — BP 116/82 | HR 88 | Temp 97.9°F | Resp 16 | Wt 250.6 lb

## 2015-09-05 DIAGNOSIS — I700 Atherosclerosis of aorta: Secondary | ICD-10-CM | POA: Diagnosis not present

## 2015-09-05 DIAGNOSIS — E1165 Type 2 diabetes mellitus with hyperglycemia: Secondary | ICD-10-CM

## 2015-09-05 DIAGNOSIS — E669 Obesity, unspecified: Secondary | ICD-10-CM

## 2015-09-05 DIAGNOSIS — N182 Chronic kidney disease, stage 2 (mild): Secondary | ICD-10-CM | POA: Diagnosis not present

## 2015-09-05 DIAGNOSIS — I00 Rheumatic fever without heart involvement: Secondary | ICD-10-CM

## 2015-09-05 DIAGNOSIS — W57XXXA Bitten or stung by nonvenomous insect and other nonvenomous arthropods, initial encounter: Secondary | ICD-10-CM | POA: Diagnosis not present

## 2015-09-05 DIAGNOSIS — E785 Hyperlipidemia, unspecified: Secondary | ICD-10-CM

## 2015-09-05 DIAGNOSIS — Z5181 Encounter for therapeutic drug level monitoring: Secondary | ICD-10-CM

## 2015-09-05 DIAGNOSIS — T148 Other injury of unspecified body region: Secondary | ICD-10-CM | POA: Diagnosis not present

## 2015-09-05 DIAGNOSIS — M1380 Other specified arthritis, unspecified site: Secondary | ICD-10-CM

## 2015-09-05 NOTE — Assessment & Plan Note (Signed)
Check A1c and urine microalb:Cr

## 2015-09-05 NOTE — Progress Notes (Signed)
BP 116/82 mmHg  Pulse 88  Temp(Src) 97.9 F (36.6 C) (Oral)  Resp 16  Wt 250 lb 9.6 oz (113.671 kg)  SpO2 96%   Subjective:    Patient ID: Nathan Rojas, male    DOB: December 30, 1975, 40 y.o.   MRN: [id number]  HPI: Nathan Rojas is a 40 y.o. male  Chief Complaint  Patient presents with  . Joint Pain    started last friday in right shoulder moved to right wrist and then right ankle.  Went to urgent care was given prednisone, pain med and muscle relaxer.  No diagnosis was given.   Patient is here for f/u after being seen in urgent care No records  Last Friday morning, he woke up and right shoulder was hurting; could not abduct (demonstrating beyond 45 degrees) because of pain Got a muscle relaxer and pain pill from a friend; they did not help Got up on Saturday and the pain was gone in the shoulder and was in the right ankle; hurt t stand and was limping That lasted through the day, then shoulder and right wrist and right ankle all hurting Saturday night Sunday morning, pain was gone from ankle and wrist, but had intensified in the shoulder, mostly in the front Hard to put a shirt on even Went to [place] on Monday; they gave him muscle relaxers and pain medicine but he told him that he didn't need them; there to help him sleep; she put him on prednisone, and has had no pain in ankle or shoulder or wrist; starting to come back with a little pain just in the shoulder Any abduction and rotation of the right shoulder across the front of the body hurts Right-handed 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[employer], gets ladder off truck, crawling under houses; cannot work right now; they took him out until Thursday; he is usually out Fridays and Saturdays anyway Needs paperwork for Mon-Thursday Not lost any strength in arm, but not taxing it; laying around house Tick bite, pulled a tick off of him last Wednesday; off of right leg; had another tick right leg earlier this year Peppermint oil to  remove ticks No fevers, no rashes, neck neck stiffness, no headaches  Type 2 diabetes; was supposed to come for diabetes in April but didn't; last A1c 10.2; sugars 120-130 when he checks he says; no log book shown today, just his memory High TG; trying to stay away from fried foods, white breads; does eat hamburgers, not every day Obesity; did manage to drop to 240 puonds, but gained back Smoke free for 8 months now  Depression screen [facility] 2/9 09/05/2015  Decreased Interest 0  Down, Depressed, Hopeless 0  PHQ - 2 Score 0   Relevant past medical, surgical, family and social history reviewed Past Medical History  Diagnosis Date  . Diabetes mellitus without complication (HCC)   . Hypertension   . Hyperlipidemia   . CKD (chronic kidney disease)   . Trigeminal neuralgia 06/03/2015  . Type 2 diabetes mellitus, uncontrolled (HCC)    Past Surgical History  Procedure Laterality Date  . Tonsillectomy    . Appendectomy    . Vasectomy  Jan 2015   Family History  Problem Relation Age of Onset  . Diabetes Sister     type 1  . Dementia Maternal Grandmother   . AAA (abdominal aortic aneurysm) Maternal Grandfather   . Hypertension Maternal Grandfather   no rheumatoid arthritis, no lupus in the family  Social  History  Substance Use Topics  . Smoking status: Former Smoker -- 0.50 packs/day    Types: Cigarettes    Quit date: 02/08/2015  . Smokeless tobacco: Never Used  . Alcohol Use: No   Interim medical history since last visit reviewed. Allergies and medications reviewed  Review of Systems Per HPI unless specifically indicated above     Objective:    BP 116/82 mmHg  Pulse 88  Temp(Src) 97.9 F (36.6 C) (Oral)  Resp 16  Wt 250 lb 9.6 oz (113.671 kg)  SpO2 96%  Wt Readings from Last 3 Encounters:  09/05/15 250 lb 9.6 oz (113.671 kg)  05/23/15 248 lb 9.6 oz (112.764 kg)  04/30/15 260 lb (117.935 kg)   body mass index is 33.98 kg/(m^2).   Physical Exam  Constitutional: He  appears well-developed and well-nourished. No distress.  HENT:  Head: Normocephalic and atraumatic.  Mouth/Throat: Mucous membranes are normal.  Missing teeth left side  Eyes: EOM are normal. No scleral icterus.  Neck: No thyromegaly present.  Cardiovascular: Normal rate and regular rhythm.   Pulmonary/Chest: Effort normal and breath sounds normal.  Abdominal: Soft. Bowel sounds are normal. He exhibits no distension.  Musculoskeletal: He exhibits no edema.       Right shoulder: He exhibits decreased range of motion (limited abduction) and tenderness. He exhibits no swelling, no crepitus and no deformity.       Right wrist: He exhibits normal range of motion, no tenderness, no swelling and no deformity.       Right ankle: He exhibits normal range of motion, no swelling and no deformity.  Neurological: Coordination normal.  Skin: Skin is warm and dry. No pallor.  Erythematous lesion on lateral right thigh; indurated healing lesion medial right thigh, appears older, less erythematous; neither has drainage or fluctuance or proximal red streaking; no insect/tick parts retained per visual inspection  Psychiatric: He has a normal mood and affect. His behavior is normal. Judgment and thought content normal.   Diabetic Foot Form - Detailed   Diabetic Foot Exam - detailed  Diabetic Foot exam was performed with the following findings:  Yes 09/05/2015 11:05 AM  Visual Foot Exam completed.:  Yes  Are the toenails long?:  No  Are the toenails thick?:  No  Are the toenails ingrown?:  No  Normal Range of Motion:  Yes    Pulse Foot Exam completed.:  Yes  Right Dorsalis Pedis:  Present Left Dorsalis Pedis:  Present  Sensory Foot Exam Completed.:  Yes  Swelling:  No  Semmes-Weinstein Monofilament Test  R Site 1-Great Toe:  Pos L Site 1-Great Toe:  Pos  R Site 4:  Pos L Site 4:  Pos  R Site 5:  Pos L Site 5:  Pos       Results for orders placed or performed in visit on 05/23/15  Basic metabolic  panel  Result Value Ref Range   Glucose 117 (H) 65 - 99 mg/dL   BUN 17 6 - 24 mg/dL   Creatinine, Ser 0.27 0.76 - 1.27 mg/dL   GFR calc non Af Amer 103 >59 mL/min/1.73   GFR calc Af Amer 119 >59 mL/min/1.73   BUN/Creatinine Ratio 18 9 - 20   Sodium 138 134 - 144 mmol/L   Potassium 4.7 3.5 - 5.2 mmol/L   Chloride 99 96 - 106 mmol/L   CO2 23 18 - 29 mmol/L   Calcium 9.4 8.7 - 10.2 mg/dL      Assessment &  Plan:   Problem List Items Addressed This Visit      Cardiovascular and Mediastinum   Abdominal aortic atherosclerosis (HCC)    Control weight, lipids, sugars, etc., take aspirin daily        Endocrine   Type 2 diabetes mellitus, uncontrolled (HCC)    Check A1c and urine microalb:Cr      Relevant Orders   Hemoglobin A1c   Microalbumin / creatinine urine ratio     Genitourinary   CKD (chronic kidney disease)    Check creatinine today        Other   Hyperlipidemia    Check lipids today, fasting today      Relevant Orders   Lipid Panel w/o Chol/HDL Ratio   Medication monitoring encounter    Check labs      Relevant Orders   Comprehensive metabolic panel   Obesity    Work on weight loss       Other Visit Diagnoses    Migratory polyarthritis    -  Primary    not behaving like typical tendonitis; will check labs; no xrays right now; out of work papers completed, see copy    Relevant Medications    cyclobenzaprine (FLEXERIL) 10 MG tablet    HYDROcodone-acetaminophen (NORCO/VICODIN) 5-325 MG tablet    Other Relevant Orders    C-reactive protein    ANA w/Reflex if Positive    CBC with Differential/Platelet    Tick bite        Relevant Orders    B. burgdorfi antibodies    C-reactive protein       Follow up plan: Return in about 2 weeks (around 09/19/2015) for diabetes follow-up.  An after-visit summary was printed and given to the patient at check-out.  Please see the patient instructions which may contain other information and recommendations beyond  what is mentioned above in the assessment and plan.  Meds ordered this encounter  Medications  . cyclobenzaprine (FLEXERIL) 10 MG tablet    Sig:   . HYDROcodone-acetaminophen (NORCO/VICODIN) 5-325 MG tablet    Sig:     Orders Placed This Encounter  Procedures  . B. burgdorfi antibodies  . C-reactive protein  . ANA w/Reflex if Positive  . CBC with Differential/Platelet  . Hemoglobin A1c  . Comprehensive metabolic panel  . Lipid Panel w/o Chol/HDL Ratio  . Microalbumin / creatinine urine ratio

## 2015-09-05 NOTE — Assessment & Plan Note (Signed)
Control weight, lipids, sugars, etc., take aspirin daily

## 2015-09-05 NOTE — Patient Instructions (Signed)
Please have labs done today Return in 2 weeks for diabetes follow-up Try to limit saturated fats in your diet (bologna, hot dogs, barbeque, cheeseburgers, hamburgers, steak, bacon, sausage, cheese, etc.) and get more fresh fruits, vegetables, and whole grains

## 2015-09-05 NOTE — Assessment & Plan Note (Signed)
Check lipids today, fasting today 

## 2015-09-05 NOTE — Assessment & Plan Note (Signed)
Check creatinine today. 

## 2015-09-05 NOTE — Assessment & Plan Note (Signed)
Check labs 

## 2015-09-05 NOTE — Assessment & Plan Note (Signed)
Work on weight loss.

## 2015-09-06 ENCOUNTER — Telehealth: Payer: Self-pay | Admitting: Family Medicine

## 2015-09-06 NOTE — Telephone Encounter (Signed)
A1c high, other abnormal labs

## 2015-09-08 ENCOUNTER — Telehealth: Payer: Self-pay | Admitting: Family Medicine

## 2015-09-08 LAB — COMPREHENSIVE METABOLIC PANEL
ALT: 29 IU/L (ref 0–44)
AST: 19 IU/L (ref 0–40)
Albumin/Globulin Ratio: 1.7 (ref 1.2–2.2)
Albumin: 4.8 g/dL (ref 3.5–5.5)
Alkaline Phosphatase: 73 IU/L (ref 39–117)
BUN/Creatinine Ratio: 22 — ABNORMAL HIGH (ref 9–20)
BUN: 21 mg/dL (ref 6–24)
Bilirubin Total: 1.5 mg/dL — ABNORMAL HIGH (ref 0.0–1.2)
CO2: 20 mmol/L (ref 18–29)
Calcium: 9.7 mg/dL (ref 8.7–10.2)
Chloride: 96 mmol/L (ref 96–106)
Creatinine, Ser: 0.97 mg/dL (ref 0.76–1.27)
GFR calc Af Amer: 112 mL/min/{1.73_m2} (ref >59)
GFR calc non Af Amer: 97 mL/min/{1.73_m2} (ref >59)
Globulin, Total: 2.8 g/dL (ref 1.5–4.5)
Glucose: 309 mg/dL — ABNORMAL HIGH (ref 65–99)
Potassium: 5.1 mmol/L (ref 3.5–5.2)
Sodium: 138 mmol/L (ref 134–144)
Total Protein: 7.6 g/dL (ref 6.0–8.5)

## 2015-09-08 LAB — C-REACTIVE PROTEIN: CRP: 0.8 mg/L (ref 0.0–4.9)

## 2015-09-08 LAB — MICROALBUMIN / CREATININE URINE RATIO
Creatinine, Urine: 301.1 mg/dL
MICROALB/CREAT RATIO: 12.3 mg/g creat (ref 0.0–30.0)
Microalbumin, Urine: 36.9 ug/mL

## 2015-09-08 LAB — CBC WITH DIFFERENTIAL/PLATELET
Basophils Absolute: 0 10*3/uL (ref 0.0–0.2)
Basos: 0 %
EOS (ABSOLUTE): 0.1 10*3/uL (ref 0.0–0.4)
Eos: 1 %
Hematocrit: 45.3 % (ref 37.5–51.0)
Hemoglobin: 15.3 g/dL (ref 12.6–17.7)
Immature Grans (Abs): 0.1 10*3/uL (ref 0.0–0.1)
Immature Granulocytes: 1 %
Lymphocytes Absolute: 2.1 10*3/uL (ref 0.7–3.1)
Lymphs: 24 %
MCH: 28.4 pg (ref 26.6–33.0)
MCHC: 33.8 g/dL (ref 31.5–35.7)
MCV: 84 fL (ref 79–97)
Monocytes Absolute: 0.4 10*3/uL (ref 0.1–0.9)
Monocytes: 5 %
Neutrophils Absolute: 6.1 10*3/uL (ref 1.4–7.0)
Neutrophils: 69 %
Platelets: 286 10*3/uL (ref 150–379)
RBC: 5.38 x10E6/uL (ref 4.14–5.80)
RDW: 13.7 % (ref 12.3–15.4)
WBC: 8.6 10*3/uL (ref 3.4–10.8)

## 2015-09-08 LAB — LIPID PANEL W/O CHOL/HDL RATIO
Cholesterol, Total: 107 mg/dL (ref 100–199)
HDL: 25 mg/dL — ABNORMAL LOW (ref >39)
LDL Calculated: 26 mg/dL (ref 0–99)
Triglycerides: 282 mg/dL — ABNORMAL HIGH (ref 0–149)
VLDL Cholesterol Cal: 56 mg/dL — ABNORMAL HIGH (ref 5–40)

## 2015-09-08 LAB — HEMOGLOBIN A1C
Est. average glucose Bld gHb Est-mCnc: 237 mg/dL
Hgb A1c MFr Bld: 9.9 % — ABNORMAL HIGH (ref 4.8–5.6)

## 2015-09-08 LAB — B. BURGDORFI ANTIBODIES: Lyme IgG/IgM Ab: <0.91 {ISR} (ref 0.00–0.90)

## 2015-09-08 LAB — ANA W/REFLEX IF POSITIVE: Anti Nuclear Antibody(ANA): NEGATIVE

## 2015-09-08 MED ORDER — MELOXICAM 15 MG PO TABS: 15.0000 mg | ORAL_TABLET | Freq: Every day | ORAL | Status: DC

## 2015-09-08 NOTE — Telephone Encounter (Signed)
Okay to be out of work Tuesday Return on Wednesday Marker of inflammation is normal, and his glucose is much too high, so I won't start prednisone Start meloxicam, once a day; do NOT take any other NSAIDs with this Triglycerides and diabetes have been elevated, so really work on diet and weight and we'll see him for next appt soon to discuss further

## 2015-09-08 NOTE — Telephone Encounter (Signed)
Addressed in other note.

## 2015-09-08 NOTE — Telephone Encounter (Signed)
Pt notified has an appt wednesday

## 2015-09-08 NOTE — Telephone Encounter (Signed)
Patient wanted to know the results of his labs in they were back.  Patient also stated that he doesn't feel well enough to go back to work on Tuesday, 09/09/15 and would like something for inflammation.  His next appointment with  Dr. Sherie DonLada is on Wednesday, 09/10/15.  Please call patient at [phone].

## 2015-09-10 ENCOUNTER — Ambulatory Visit: Payer: Managed Care, Other (non HMO) | Admitting: Family Medicine

## 2015-09-10 ENCOUNTER — Encounter: Payer: Self-pay | Admitting: Family Medicine

## 2015-09-10 ENCOUNTER — Ambulatory Visit (INDEPENDENT_AMBULATORY_CARE_PROVIDER_SITE_OTHER): Payer: Managed Care, Other (non HMO) | Admitting: Family Medicine

## 2015-09-10 VITALS — BP 118/74 | HR 96 | Temp 98.1°F | Resp 18 | Ht 73.0 in | Wt 252.3 lb

## 2015-09-10 DIAGNOSIS — E669 Obesity, unspecified: Secondary | ICD-10-CM

## 2015-09-10 DIAGNOSIS — E785 Hyperlipidemia, unspecified: Secondary | ICD-10-CM

## 2015-09-10 DIAGNOSIS — E1165 Type 2 diabetes mellitus with hyperglycemia: Secondary | ICD-10-CM

## 2015-09-10 DIAGNOSIS — I700 Atherosclerosis of aorta: Secondary | ICD-10-CM

## 2015-09-10 DIAGNOSIS — M25511 Pain in right shoulder: Secondary | ICD-10-CM | POA: Diagnosis not present

## 2015-09-10 DIAGNOSIS — I10 Essential (primary) hypertension: Secondary | ICD-10-CM

## 2015-09-10 MED ORDER — ICOSAPENT ETHYL 1 G PO CAPS: 2.0000 | ORAL_CAPSULE | Freq: Two times a day (BID) | ORAL | Status: DC

## 2015-09-10 MED ORDER — ATORVASTATIN CALCIUM 40 MG PO TABS: 20.0000 mg | ORAL_TABLET | Freq: Every day | ORAL | Status: DC

## 2015-09-10 MED ORDER — CANAGLIFLOZIN 100 MG PO TABS: 100.0000 mg | ORAL_TABLET | Freq: Every day | ORAL | Status: DC

## 2015-09-10 NOTE — Progress Notes (Signed)
BP 118/74 mmHg  Pulse 96  Temp(Src) 98.1 F (36.7 C) (Oral)  Resp 18  Ht 6\' 1"  (1.854 m)  Wt 252 lb 4.8 oz (114.443 kg)  BMI 33.29 kg/m2  SpO2 97%   Subjective:    Patient ID: Nathan Rojas, male    DOB: [birth year], 40 y.o.   MRN: [id number]  HPI: Nathan Rojas is a 40 y.o. male  Chief Complaint  Patient presents with  . Shoulder Pain    Right shoulder has flaired back up    Patient is here for an acute visit He says his right shoulder flared back up on Saturday afternoon; he was fishing, but only cast twice Nothing was biting anyway By Saturday evening, could not raise it to 90 degrees; can reach out now beyond 90 degrees Since last week, tried steroid, muscle relaxer, pain pill meloxicam is doing the trick Sore but can work No numbness or electric shocks down the arm; no pain in the neck  Last A1c 9.9, type 2 diabetes; he slacked off and has not been back for follow-up he admits On max dose of metformin; taking every day; no sugary drinks, drinks diet [employer] Bilirubin was 1.5; no abd pain; has had kidney stones once before, no gallstones to his knowledge Atherosclerosis of aorta on Aug 2016 on CT scan for stones  Depression screen [facility] 2/9 09/05/2015  Decreased Interest 0  Down, Depressed, Hopeless 0  PHQ - 2 Score 0   Relevant past medical, surgical, family and social history reviewed Past Medical History  Diagnosis Date  . Diabetes mellitus without complication (HCC)   . Hypertension   . Hyperlipidemia   . CKD (chronic kidney disease)   . Trigeminal neuralgia 06/03/2015  . Type 2 diabetes mellitus, uncontrolled (HCC)    Past Surgical History  Procedure Laterality Date  . Tonsillectomy    . Appendectomy    . Vasectomy  Jan 2015   Family History  Problem Relation Age of Onset  . Diabetes Sister     type 1  . Dementia Maternal Grandmother   . AAA (abdominal aortic aneurysm) Maternal Grandfather   . Hypertension Maternal Grandfather    Social History    Substance Use Topics  . Smoking status: Former Smoker -- 0.50 packs/day    Types: Cigarettes    Quit date: 02/08/2015  . Smokeless tobacco: Never Used  . Alcohol Use: No   Interim medical history since last visit reviewed. Allergies and medications reviewed  Review of Systems Per HPI unless specifically indicated above     Objective:    BP 118/74 mmHg  Pulse 96  Temp(Src) 98.1 F (36.7 C) (Oral)  Resp 18  Ht 6\' 1"  (1.854 m)  Wt 252 lb 4.8 oz (114.443 kg)  BMI 33.29 kg/m2  SpO2 97%  Wt Readings from Last 3 Encounters:  09/10/15 252 lb 4.8 oz (114.443 kg)  09/05/15 250 lb 9.6 oz (113.671 kg)  05/23/15 248 lb 9.6 oz (112.764 kg)   Body mass index is 33.29 kg/(m^2).  Physical Exam  Constitutional: He appears well-developed and well-nourished. No distress.  Eyes: No scleral icterus.  Cardiovascular: Normal rate and regular rhythm.   Pulmonary/Chest: Effort normal and breath sounds normal.  Abdominal: He exhibits no distension.  Musculoskeletal: He exhibits no edema.       Right shoulder: He exhibits decreased range of motion and tenderness. He exhibits no bony tenderness, no swelling, no effusion, no crepitus, no deformity and normal strength.  Neurological: Coordination normal.  Skin: Skin is warm and dry. No rash noted. No pallor.  Specifically no rash to suggest shingles of the right shoulder/arm  Psychiatric: He has a normal mood and affect. His behavior is normal. Judgment and thought content normal. His mood appears not anxious. He does not exhibit a depressed mood.    Results for orders placed or performed in visit on 09/05/15  B. burgdorfi antibodies  Result Value Ref Range   Lyme IgG/IgM Ab <0.91 0.00 - 0.90 ISR  C-reactive protein  Result Value Ref Range   CRP 0.8 0.0 - 4.9 mg/L  ANA w/Reflex if Positive  Result Value Ref Range   Anit Nuclear Antibody(ANA) Negative Negative  CBC with Differential/Platelet  Result Value Ref Range   WBC 8.6 3.4 - 10.8  x10E3/uL   RBC 5.38 4.14 - 5.80 x10E6/uL   Hemoglobin 15.3 12.6 - 17.7 g/dL   Hematocrit 16.145.3 09.637.5 - 51.0 %   MCV 84 79 - 97 fL   MCH 28.4 26.6 - 33.0 pg   MCHC 33.8 31.5 - 35.7 g/dL   RDW 04.513.7 40.912.3 - 81.115.4 %   Platelets 286 150 - 379 x10E3/uL   Neutrophils 69 %   Lymphs 24 %   Monocytes 5 %   Eos 1 %   Basos 0 %   Neutrophils Absolute 6.1 1.4 - 7.0 x10E3/uL   Lymphocytes Absolute 2.1 0.7 - 3.1 x10E3/uL   Monocytes Absolute 0.4 0.1 - 0.9 x10E3/uL   EOS (ABSOLUTE) 0.1 0.0 - 0.4 x10E3/uL   Basophils Absolute 0.0 0.0 - 0.2 x10E3/uL   Immature Granulocytes 1 %   Immature Grans (Abs) 0.1 0.0 - 0.1 x10E3/uL  Hemoglobin A1c  Result Value Ref Range   Hgb A1c MFr Bld 9.9 (H) 4.8 - 5.6 %   Est. average glucose Bld gHb Est-mCnc 237 mg/dL  Comprehensive metabolic panel  Result Value Ref Range   Glucose 309 (H) 65 - 99 mg/dL   BUN 21 6 - 24 mg/dL   Creatinine, Ser 9.140.97 0.76 - 1.27 mg/dL   GFR calc non Af Amer 97 >59 mL/min/1.73   GFR calc Af Amer 112 >59 mL/min/1.73   BUN/Creatinine Ratio 22 (H) 9 - 20   Sodium 138 134 - 144 mmol/L   Potassium 5.1 3.5 - 5.2 mmol/L   Chloride 96 96 - 106 mmol/L   CO2 20 18 - 29 mmol/L   Calcium 9.7 8.7 - 10.2 mg/dL   Total Protein 7.6 6.0 - 8.5 g/dL   Albumin 4.8 3.5 - 5.5 g/dL   Globulin, Total 2.8 1.5 - 4.5 g/dL   Albumin/Globulin Ratio 1.7 1.2 - 2.2   Bilirubin Total 1.5 (H) 0.0 - 1.2 mg/dL   Alkaline Phosphatase 73 39 - 117 IU/L   AST 19 0 - 40 IU/L   ALT 29 0 - 44 IU/L  Lipid Panel w/o Chol/HDL Ratio  Result Value Ref Range   Cholesterol, Total 107 100 - 199 mg/dL   Triglycerides 782282 (H) 0 - 149 mg/dL   HDL 25 (L) >95>39 mg/dL   VLDL Cholesterol Cal 56 (H) 5 - 40 mg/dL   LDL Calculated 26 0 - 99 mg/dL  Microalbumin / creatinine urine ratio  Result Value Ref Range   Creatinine, Urine 301.1 Not Estab. mg/dL   Microalbum.,U,Random 36.9 Not Estab. ug/mL   MICROALB/CREAT RATIO 12.3 0.0 - 30.0 mg/g creat      Assessment & Plan:   Problem  List Items Addressed This Visit  Cardiovascular and Mediastinum   Hypertension    Well-controlled today; work on weight loss      Relevant Medications   atorvastatin (LIPITOR) 40 MG tablet   Icosapent Ethyl 1 g CAPS   Abdominal aortic atherosclerosis (HCC)    Noted incidentally on CT scan, reviewed with patient; goal LDL under 100; so glad he quit smoking; work on weight loss      Relevant Medications   atorvastatin (LIPITOR) 40 MG tablet   Icosapent Ethyl 1 g CAPS     Endocrine   Type 2 diabetes mellitus, uncontrolled (HCC)    Adjust meds; see AVS; goal A1c less than 7, important to keep appointments, follow-up; work on weight loss, healthy eating      Relevant Medications   atorvastatin (LIPITOR) 40 MG tablet   canagliflozin (INVOKANA) 100 MG TABS tablet     Other   Obesity    Work on weight loss; invokana may help this      Relevant Medications   canagliflozin (INVOKANA) 100 MG TABS tablet   Hyperlipidemia    Goal LDL under 70 and his was all the way down at 26 on 40 mg of lipitor; will reduce the lipitor to 20 mg daily and add vascepa for the high TG; recheck lipids at follow-up fasting      Relevant Medications   atorvastatin (LIPITOR) 40 MG tablet   Icosapent Ethyl 1 g CAPS    Other Visit Diagnoses    Right shoulder pain    -  Primary    suspect acute inflammatory process; no MRI; can refer to PT if persistent        Follow up plan: Return in about 3 months (around 12/12/2015).  An after-visit summary was printed and given to the patient at check-out.  Please see the patient instructions which may contain other information and recommendations beyond what is mentioned above in the assessment and plan.  Meds ordered this encounter  Medications  . atorvastatin (LIPITOR) 40 MG tablet    Sig: Take 0.5 tablets (20 mg total) by mouth at bedtime.    Dispense:  45 tablet    Refill:  1    New instructions, just half of a pill at bedtime  . DISCONTD:  Icosapent Ethyl 1 g CAPS    Sig: Take 2 capsules by mouth 2 (two) times daily. (this replaces fish oil)    Dispense:  120 capsule    Refill:  5  . Icosapent Ethyl 1 g CAPS    Sig: Take 2 capsules by mouth 2 (two) times daily. (this replaces fish oil)    Dispense:  360 capsule    Refill:  3    Patient has Vascepa savings card  . canagliflozin (INVOKANA) 100 MG TABS tablet    Sig: Take 1 tablet (100 mg total) by mouth daily.    Dispense:  30 tablet    Refill:  5    No orders of the defined types were placed in this encounter.

## 2015-09-10 NOTE — Patient Instructions (Addendum)
Continue the metformin Add invokana to help control your sugars If you get extreme dry mouth, thirst, excessive urination, headaches, blurred vision, etc then come in or go to urgent care Decrease the atorvastatin (Lipitor) from 40 mg to 20 mg at night Add Vascepa twice a day to take the place of fish oil Continue the aspirin; take one hour or more BEFORE you take meloxicam Recheck fasting labs in 3 months, just on or after September 15th for fasting labs and visit If any abdominal pain, go to the ER

## 2015-09-19 ENCOUNTER — Ambulatory Visit: Payer: Managed Care, Other (non HMO) | Admitting: Family Medicine

## 2015-10-12 NOTE — Assessment & Plan Note (Signed)
Well-controlled today; work on weight loss 

## 2015-10-12 NOTE — Assessment & Plan Note (Signed)
Work on weight loss; invokana may help this

## 2015-10-12 NOTE — Assessment & Plan Note (Addendum)
Goal LDL under 70 and his was all the way down at 26 on 40 mg of lipitor; will reduce the lipitor to 20 mg daily and add vascepa for the high TG; recheck lipids at follow-up fasting

## 2015-10-12 NOTE — Assessment & Plan Note (Signed)
Adjust meds; see AVS; goal A1c less than 7, important to keep appointments, follow-up; work on weight loss, healthy eating

## 2015-10-12 NOTE — Assessment & Plan Note (Signed)
Noted incidentally on CT scan, reviewed with patient; goal LDL under 100; so glad he quit smoking; work on weight loss

## 2015-10-24 ENCOUNTER — Ambulatory Visit: Payer: Managed Care, Other (non HMO) | Admitting: Family Medicine

## 2015-12-09 ENCOUNTER — Other Ambulatory Visit: Payer: Self-pay | Admitting: Family Medicine

## 2015-12-10 NOTE — Telephone Encounter (Signed)
Patient canceled his appointment A1c last time was out of control Please ask him to schedule another appointment ASAP I can only approved 30 day supply since doses may change and he needs bloodwork

## 2015-12-11 ENCOUNTER — Ambulatory Visit: Payer: Managed Care, Other (non HMO) | Admitting: Family Medicine

## 2015-12-11 NOTE — Telephone Encounter (Signed)
Pt is scheduled for October.

## 2016-01-02 ENCOUNTER — Ambulatory Visit: Payer: Managed Care, Other (non HMO) | Admitting: Family Medicine

## 2016-01-09 ENCOUNTER — Encounter: Payer: Self-pay | Admitting: Family Medicine

## 2016-01-09 ENCOUNTER — Ambulatory Visit (INDEPENDENT_AMBULATORY_CARE_PROVIDER_SITE_OTHER): Payer: Managed Care, Other (non HMO) | Admitting: Family Medicine

## 2016-01-09 VITALS — BP 134/82 | HR 80 | Temp 97.5°F | Resp 16 | Ht 73.0 in | Wt 256.0 lb

## 2016-01-09 DIAGNOSIS — Z23 Encounter for immunization: Secondary | ICD-10-CM

## 2016-01-09 DIAGNOSIS — Z5181 Encounter for therapeutic drug level monitoring: Secondary | ICD-10-CM | POA: Diagnosis not present

## 2016-01-09 DIAGNOSIS — M2550 Pain in unspecified joint: Secondary | ICD-10-CM | POA: Insufficient documentation

## 2016-01-09 DIAGNOSIS — E1165 Type 2 diabetes mellitus with hyperglycemia: Secondary | ICD-10-CM

## 2016-01-09 DIAGNOSIS — E782 Mixed hyperlipidemia: Secondary | ICD-10-CM

## 2016-01-09 DIAGNOSIS — I10 Essential (primary) hypertension: Secondary | ICD-10-CM | POA: Diagnosis not present

## 2016-01-09 LAB — CBC WITH DIFFERENTIAL/PLATELET
Basophils Absolute: 0 cells/uL (ref 0–200)
Basophils Relative: 0 %
Eosinophils Absolute: 114 cells/uL (ref 15–500)
Eosinophils Relative: 2 %
HCT: 41.7 % (ref 38.5–50.0)
Hemoglobin: 14.2 g/dL (ref 13.2–17.1)
Lymphocytes Relative: 32 %
Lymphs Abs: 1824 cells/uL (ref 850–3900)
MCH: 28.2 pg (ref 27.0–33.0)
MCHC: 34.1 g/dL (ref 32.0–36.0)
MCV: 82.9 fL (ref 80.0–100.0)
MPV: 10 fL (ref 7.5–12.5)
Monocytes Absolute: 342 cells/uL (ref 200–950)
Monocytes Relative: 6 %
Neutro Abs: 3420 cells/uL (ref 1500–7800)
Neutrophils Relative %: 60 %
Platelets: 290 10*3/uL (ref 140–400)
RBC: 5.03 MIL/uL (ref 4.20–5.80)
RDW: 13.6 % (ref 11.0–15.0)
WBC: 5.7 10*3/uL (ref 3.8–10.8)

## 2016-01-09 MED ORDER — DICLOFENAC SODIUM 75 MG PO TBEC: 75.0000 mg | DELAYED_RELEASE_TABLET | Freq: Two times a day (BID) | ORAL | 1 refills | Status: DC

## 2016-01-09 NOTE — Assessment & Plan Note (Signed)
Check lipids 

## 2016-01-09 NOTE — Assessment & Plan Note (Signed)
Check labs 

## 2016-01-09 NOTE — Assessment & Plan Note (Signed)
Try DASH guidelines; work on weight loss

## 2016-01-09 NOTE — Progress Notes (Signed)
BP 134/82 (BP Location: Left Arm, Patient Position: Sitting, Cuff Size: Normal)   Pulse 80   Temp 97.5 F (36.4 C) (Oral)   Resp 16   Ht 6\' 1"  (1.854 m)   Wt 256 lb (116.1 kg)   SpO2 96%   BMI 33.78 kg/m    Subjective:    Patient ID: Nathan Rojas, male    DOB: [birth year], 40 y.o.   MRN: [id number]  HPI: Nathan OrnDonald R Derrig is a 40 y.o. male  Chief Complaint  Patient presents with  . 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; played disc golf, not able to do anything now; both shoulders affdted; left shoulder today is okay; any type of activity outside the normal and it really affects hm for 3-4 days; taking 6 aleve a day; no stomach ulcer, no blood in stool  High cholesterol; TG 282, HDL 25; he has almost cut all of the fatty meats out; almost no fried foods whatsoever; more baked foods; cut down on hot dogs; he likes cheese, occasional eggs  Obesity; he has not been successful with weigh tloss  [facility] 2[facility] 01/09/2016 09/05/2015  Decreased Interest 0 0  Down, Depressed, Hopeless 0 0  PHQ - 2 Score 0 0   Relevant past medical, surgical, family and social history reviewed Past Medical History:  Diagnosis Date  . CKD (chronic kidney disease)   . Hyperlipidemia   . Hypertension   . Trigeminal neuralgia 06/03/2015  . Type 2 diabetes mellitus, uncontrolled (HCC)    Past Surgical History:  Procedure Laterality Date  . APPENDECTOMY    . TONSILLECTOMY    . VASECTOMY  Jan 2015   Family History  Problem Relation Age of Onset  . Diabetes Sister     type 1  . Dementia Maternal Grandmother   . AAA (abdominal aortic aneurysm) Maternal Grandfather   . Hypertension Maternal Grandfather    Social History  Substance Use Topics  .  Smoking status: Former Smoker    Packs/day: 0.50    Types: Cigarettes    Quit date: 02/08/2015  . Smokeless tobacco: Never Used  . Alcohol use No   Interim medical history since last visit reviewed. Allergies and medications reviewed  Review of Systems Per HPI unless specifically indicated above     Objective:    BP 134/82 (BP Location: Left Arm, Patient Position: Sitting, Cuff Size: Normal)   Pulse 80   Temp 97.5 F (36.4 C) (Oral)   Resp 16   Ht 6\' 1"  (1.854 m)   Wt 256 lb (116.1 kg)   SpO2 96%   BMI 33.78 kg/m   Wt Readings from Last 3 Encounters:  01/09/16 256 lb (116.1 kg)  09/10/15 252 lb 4.8 oz (114.4 kg)  09/05/15 250 lb 9.6 oz (113.7 kg)    Physical Exam  Constitutional: He appears well-developed and well-nourished. No distress.  HENT:  Head: Normocephalic and atraumatic.  Missing several teeth  Eyes: EOM are normal. No scleral icterus.  Neck: No thyromegaly present.  Cardiovascular: Normal rate and regular rhythm.   Pulmonary/Chest: Effort normal and breath sounds normal.  Abdominal: Soft. Bowel sounds are normal. He exhibits no distension.  Musculoskeletal: He exhibits no edema.  Neurological: Coordination normal.  Skin: Skin is warm. No pallor.  Psychiatric: He has a normal mood and affect. His behavior is normal. Judgment and thought content normal. His mood appears not anxious. He does not exhibit a depressed mood.   Diabetic Foot Form - Detailed   Diabetic Foot Exam - detailed Diabetic Foot exam was performed with the following findings:  Yes 01/09/2016  7:35 PM  Visual Foot Exam completed.:  Yes  Are the toenails ingrown?:  No Normal Range of Motion:  Yes Pulse Foot Exam completed.:  Yes  Right Dorsalis Pedis:  Present Left Dorsalis Pedis:  Present  Sensory Foot Exam Completed.:  Yes Swelling:  No Semmes-Weinstein Monofilament Test R Site 1-Great Toe:  Pos L Site 1-Great Toe:  Pos  R Site 4:  Pos L Site 4:  Pos  R Site 5:  Pos L Site 5:  Pos            Assessment & Plan:   Problem List Items Addressed This Visit      Cardiovascular and Mediastinum   Hypertension - Primary (Chronic)    Try DASH guidelines; work on weight loss        Endocrine   Type 2 diabetes mellitus, uncontrolled (HCC) (Chronic)    Foot exam today; check A1c      Relevant Orders   Hemoglobin A1c (Completed)     Other   Medication monitoring encounter    Check labs      Relevant Orders   COMPLETE METABOLIC PANEL WITH GFR (Completed)   Hyperlipidemia (Chronic)    Check lipids      Relevant Orders   Lipid panel (Completed)   Arthralgia of multiple joints    Check CRP, ANA, vit D      Relevant Orders   ANA,IFA RA Diag Pnl w/rflx Tit/Patn (Completed)   CBC with Differential/Platelet (Completed)   B. burgdorfi antibodies   C-reactive protein (Completed)   VITAMIN D 25 Hydroxy (Vit-D Deficiency, Fractures) (Completed)    Other Visit Diagnoses    Needs flu shot       Relevant Orders   Flu Vaccine QUAD 36+ mos PF IM (Fluarix & Fluzone Quad PF) (Completed)      Follow up plan: Return in about 3 months (around 04/10/2016) for fasting labs and visit with Dr. Sherie Don.  An after-visit summary was printed and given to the patient at check-out.  Please see the patient instructions which may contain other information and recommendations beyond what is mentioned above in the assessment and plan.  Meds ordered this encounter  Medications  . diclofenac (VOLTAREN) 75 MG EC tablet    Sig: Take 1 tablet (75 mg total) by mouth 2 (two) times daily. If needed for muscular pain; take with food    Dispense:  40 tablet    Refill:  1    Orders Placed This Encounter  Procedures  . Flu Vaccine QUAD 36+ mos PF IM (Fluarix & Fluzone Quad PF)  . ANA,IFA RA Diag Pnl w/rflx Tit/Patn  . Hemoglobin A1c  . CBC with Differential/Platelet  . B. burgdorfi antibodies  . C-reactive protein  . Lipid panel  . COMPLETE METABOLIC PANEL WITH GFR  . VITAMIN D 25  Hydroxy (Vit-D Deficiency, Fractures)

## 2016-01-09 NOTE — Assessment & Plan Note (Signed)
Foot exam today; check A1c 

## 2016-01-09 NOTE — Patient Instructions (Addendum)
You received the flu shot today; it should protect you against the flu virus over the coming months; it will take about two weeks for antibodies to develop; do try to stay away from hospitals, nursing homes, and daycares during peak flu season; taking extra vitamin C daily during flu season may help you avoid getting sick  Check out the information at familydoctor.org entitled "Nutrition for Weight Loss: What You Need to Know about Fad Diets" Try to lose between 1-2 pounds per week by taking in fewer calories and burning off more calories You can succeed by limiting portions, limiting foods dense in calories and fat, becoming more active, and drinking 8 glasses of water a day (64 ounces) Don't skip meals, especially breakfast, as skipping meals may alter your metabolism Do not use over-the-counter weight loss pills or gimmicks that claim rapid weight loss A healthy BMI (or body mass index) is between 18.5 and 24.9 You can calculate your ideal BMI at the NIH website [employer]huhttp://www.nhlbi.nih.gov/health/educational/lose_wt/BMI/bmicalc.htm Shoot for 10+ pounds of weight loss prior to next visit

## 2016-01-09 NOTE — Assessment & Plan Note (Signed)
Will aim for 10 pounds of weight loss over next 3 months

## 2016-01-09 NOTE — Assessment & Plan Note (Signed)
Check CRP, ANA, vit D

## 2016-01-10 ENCOUNTER — Encounter: Payer: Self-pay | Admitting: Family Medicine

## 2016-01-10 LAB — COMPLETE METABOLIC PANEL WITH GFR
ALT: 22 U/L (ref 9–46)
AST: 18 U/L (ref 10–40)
Albumin: 4.1 g/dL (ref 3.6–5.1)
Alkaline Phosphatase: 60 U/L (ref 40–115)
BUN: 15 mg/dL (ref 7–25)
CO2: 23 mmol/L (ref 20–31)
Calcium: 9.1 mg/dL (ref 8.6–10.3)
Chloride: 105 mmol/L (ref 98–110)
Creat: 0.82 mg/dL (ref 0.60–1.35)
GFR, Est African American: >89 mL/min (ref >=60)
GFR, Est Non African American: >89 mL/min (ref >=60)
Glucose, Bld: 106 mg/dL — ABNORMAL HIGH (ref 65–99)
Potassium: 3.6 mmol/L (ref 3.5–5.3)
Sodium: 139 mmol/L (ref 135–146)
Total Bilirubin: 0.8 mg/dL (ref 0.2–1.2)
Total Protein: 6.6 g/dL (ref 6.1–8.1)

## 2016-01-10 LAB — LIPID PANEL
Cholesterol: 104 mg/dL — ABNORMAL LOW (ref 125–200)
HDL: 21 mg/dL — ABNORMAL LOW (ref >=40)
LDL Cholesterol: 63 mg/dL (ref <130)
Total CHOL/HDL Ratio: 5 Ratio (ref <=5.0)
Triglycerides: 99 mg/dL (ref <150)
VLDL: 20 mg/dL (ref <30)

## 2016-01-10 LAB — HEMOGLOBIN A1C
Hgb A1c MFr Bld: 9.1 % — ABNORMAL HIGH (ref <5.7)
Mean Plasma Glucose: 214 mg/dL

## 2016-01-10 LAB — VITAMIN D 25 HYDROXY (VIT D DEFICIENCY, FRACTURES): Vit D, 25-Hydroxy: 26 ng/mL — ABNORMAL LOW (ref 30–100)

## 2016-01-11 LAB — C-REACTIVE PROTEIN: CRP: 13.4 mg/L — ABNORMAL HIGH (ref <8.0)

## 2016-01-12 ENCOUNTER — Other Ambulatory Visit: Payer: Self-pay | Admitting: Family Medicine

## 2016-01-12 DIAGNOSIS — R7982 Elevated C-reactive protein (CRP): Secondary | ICD-10-CM

## 2016-01-12 DIAGNOSIS — M2550 Pain in unspecified joint: Secondary | ICD-10-CM

## 2016-01-12 LAB — ANA,IFA RA DIAG PNL W/RFLX TIT/PATN
Anti Nuclear Antibody(ANA): NEGATIVE
Cyclic Citrullin Peptide Ab: 43 Units — ABNORMAL HIGH
Rhuematoid fact SerPl-aCnc: 54 IU/mL — ABNORMAL HIGH (ref <14)

## 2016-01-12 MED ORDER — CANAGLIFLOZIN 300 MG PO TABS: 300.0000 mg | ORAL_TABLET | Freq: Every day | ORAL | 5 refills | Status: DC

## 2016-01-12 NOTE — Progress Notes (Signed)
Sending higher dose of invokana Refer to rheum

## 2016-01-12 NOTE — Assessment & Plan Note (Signed)
With elev CRP; refer to rheum

## 2016-01-14 ENCOUNTER — Other Ambulatory Visit: Payer: Self-pay

## 2016-01-14 MED ORDER — CANAGLIFLOZIN 300 MG PO TABS: 300.0000 mg | ORAL_TABLET | Freq: Every day | ORAL | 1 refills | Status: DC

## 2016-01-14 NOTE — Telephone Encounter (Signed)
Patient needs 90 day supply

## 2016-01-14 NOTE — Telephone Encounter (Signed)
90d supply sent.

## 2016-01-22 ENCOUNTER — Telehealth: Payer: Self-pay | Admitting: Family Medicine

## 2016-01-22 NOTE — Telephone Encounter (Signed)
I had been trying this number, copied and pasted: Phone:  [phone] I found another number and reached him He says that's not the right number Discussed his labs Front staff -- can you remove that number from his chart? Thank you

## 2016-01-23 DIAGNOSIS — G8929 Other chronic pain: Secondary | ICD-10-CM

## 2016-01-23 DIAGNOSIS — M2550 Pain in unspecified joint: Secondary | ICD-10-CM

## 2016-01-23 DIAGNOSIS — M059 Rheumatoid arthritis with rheumatoid factor, unspecified: Secondary | ICD-10-CM

## 2016-01-23 DIAGNOSIS — R768 Other specified abnormal immunological findings in serum: Secondary | ICD-10-CM | POA: Insufficient documentation

## 2016-01-23 DIAGNOSIS — R7982 Elevated C-reactive protein (CRP): Secondary | ICD-10-CM | POA: Insufficient documentation

## 2016-01-23 HISTORY — DX: Rheumatoid arthritis with rheumatoid factor, unspecified: M05.9

## 2016-01-23 HISTORY — DX: Other chronic pain: G89.29

## 2016-01-23 HISTORY — DX: Elevated C-reactive protein (CRP): R79.82

## 2016-01-26 NOTE — Telephone Encounter (Signed)
done

## 2016-02-27 ENCOUNTER — Other Ambulatory Visit: Payer: Self-pay

## 2016-02-27 MED ORDER — LISINOPRIL 10 MG PO TABS: 10.0000 mg | ORAL_TABLET | Freq: Every day | ORAL | 1 refills | Status: DC

## 2016-02-27 MED ORDER — ATORVASTATIN CALCIUM 40 MG PO TABS: 20.0000 mg | ORAL_TABLET | Freq: Every day | ORAL | 1 refills | Status: DC

## 2016-02-27 NOTE — Telephone Encounter (Signed)
Last labs reviewed; rxs approved

## 2016-03-05 DIAGNOSIS — Z79899 Other long term (current) drug therapy: Secondary | ICD-10-CM | POA: Insufficient documentation

## 2016-04-16 ENCOUNTER — Ambulatory Visit: Payer: Managed Care, Other (non HMO) | Admitting: Family Medicine

## 2016-05-12 ENCOUNTER — Ambulatory Visit: Payer: Managed Care, Other (non HMO) | Admitting: Family Medicine

## 2016-05-27 DIAGNOSIS — M659 Synovitis and tenosynovitis, unspecified: Secondary | ICD-10-CM | POA: Insufficient documentation

## 2016-05-27 DIAGNOSIS — M65949 Unspecified synovitis and tenosynovitis, unspecified hand: Secondary | ICD-10-CM

## 2016-05-27 HISTORY — DX: Unspecified synovitis and tenosynovitis, unspecified hand: M65.949

## 2016-05-27 HISTORY — DX: Synovitis and tenosynovitis, unspecified: M65.9

## 2016-06-29 ENCOUNTER — Telehealth: Payer: Self-pay | Admitting: Family Medicine

## 2016-06-29 MED ORDER — CANAGLIFLOZIN 300 MG PO TABS: 300.0000 mg | ORAL_TABLET | Freq: Every day | ORAL | 0 refills | Status: DC

## 2016-06-29 NOTE — Telephone Encounter (Signed)
Patient no showed in January; he canceled appt in February No upcoming appointments scheduled Please call and ask him to schedule a visit with fasting labs He's overdue for labs (due in January, 3 months ago), so I'll send a limited supply of med to local pharmacy and we'll see if still appropriate Thank you

## 2016-06-30 NOTE — Telephone Encounter (Signed)
Patient informed and appt made for 07/09/16

## 2016-07-09 ENCOUNTER — Encounter: Payer: Self-pay | Admitting: Family Medicine

## 2016-07-09 ENCOUNTER — Ambulatory Visit (INDEPENDENT_AMBULATORY_CARE_PROVIDER_SITE_OTHER): Payer: Managed Care, Other (non HMO) | Admitting: Family Medicine

## 2016-07-09 DIAGNOSIS — I700 Atherosclerosis of aorta: Secondary | ICD-10-CM

## 2016-07-09 DIAGNOSIS — Z5181 Encounter for therapeutic drug level monitoring: Secondary | ICD-10-CM

## 2016-07-09 DIAGNOSIS — E559 Vitamin D deficiency, unspecified: Secondary | ICD-10-CM | POA: Diagnosis not present

## 2016-07-09 DIAGNOSIS — E1165 Type 2 diabetes mellitus with hyperglycemia: Secondary | ICD-10-CM

## 2016-07-09 DIAGNOSIS — I10 Essential (primary) hypertension: Secondary | ICD-10-CM | POA: Diagnosis not present

## 2016-07-09 DIAGNOSIS — E6609 Other obesity due to excess calories: Secondary | ICD-10-CM

## 2016-07-09 DIAGNOSIS — E782 Mixed hyperlipidemia: Secondary | ICD-10-CM

## 2016-07-09 DIAGNOSIS — N182 Chronic kidney disease, stage 2 (mild): Secondary | ICD-10-CM

## 2016-07-09 DIAGNOSIS — Z6831 Body mass index (BMI) 31.0-31.9, adult: Secondary | ICD-10-CM

## 2016-07-09 HISTORY — DX: Vitamin D deficiency, unspecified: E55.9

## 2016-07-09 LAB — COMPLETE METABOLIC PANEL WITH GFR
ALT: 35 U/L (ref 9–46)
AST: 32 U/L (ref 10–40)
Albumin: 4.7 g/dL (ref 3.6–5.1)
Alkaline Phosphatase: 62 U/L (ref 40–115)
BUN: 21 mg/dL (ref 7–25)
CO2: 24 mmol/L (ref 20–31)
Calcium: 9.4 mg/dL (ref 8.6–10.3)
Chloride: 103 mmol/L (ref 98–110)
Creat: 0.96 mg/dL (ref 0.60–1.35)
GFR, Est African American: >89 mL/min (ref >=60)
GFR, Est Non African American: >89 mL/min (ref >=60)
Glucose, Bld: 112 mg/dL — ABNORMAL HIGH (ref 65–99)
Potassium: 4.6 mmol/L (ref 3.5–5.3)
Sodium: 137 mmol/L (ref 135–146)
Total Bilirubin: 1.2 mg/dL (ref 0.2–1.2)
Total Protein: 7.5 g/dL (ref 6.1–8.1)

## 2016-07-09 LAB — LIPID PANEL
Cholesterol: 114 mg/dL (ref <200)
HDL: 21 mg/dL — ABNORMAL LOW (ref >40)
LDL Cholesterol: 58 mg/dL (ref <100)
Total CHOL/HDL Ratio: 5.4 Ratio — ABNORMAL HIGH (ref <5.0)
Triglycerides: 176 mg/dL — ABNORMAL HIGH (ref <150)
VLDL: 35 mg/dL — ABNORMAL HIGH (ref <30)

## 2016-07-09 NOTE — Assessment & Plan Note (Signed)
Patient has lost 17 pounds on his own with hard work; Copywriter, advertising given

## 2016-07-09 NOTE — Assessment & Plan Note (Signed)
Check labs 

## 2016-07-09 NOTE — Assessment & Plan Note (Signed)
Well-controlled today; continue current plan

## 2016-07-09 NOTE — Progress Notes (Signed)
BP 128/82   Pulse 79   Temp 97.6 F (36.4 C) (Oral)   Resp 16   Wt 239 lb 12.8 oz (108.8 kg)   SpO2 98%   BMI 31.64 kg/m    Subjective:    Patient ID: Nathan Rojas, male    DOB: 1975-06-15, 41 y.o.   MRN: [id number]  HPI: Nathan Rojas is a 41 y.o. male  Chief Complaint  Patient presents with  . 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, BID, last two weeks; 110-200 range; 156 averages; 33% checks in range; uses [employer] app  High blood pressure; well-controlled today  High cholesterol; on statin plus icosapentanoic acid capsules; limiting saturated fats; 93% fat free meat for tacos  Obesity; he has lost 17 pounds since last visit; trying to eat healthier; iff all you can buffet, does the salad and veggies; peaches for dessert instead of other desserts; he is truly working for this  Vitamin D deficiency, taking 2,000 iu daily  Hx of kidney stones; staying hydrated  Quit smoking almost 2 years ago, Feb 01, 2015  Depression screen [facility] 2/9 07/09/2016 01/09/2016 09/05/2015  Decreased Interest 0 0 0  Down, Depressed, Hopeless 0 0 0  PHQ - 2 Score 0 0 0    Relevant past medical, surgical, family and social history reviewed Past Medical History:  Diagnosis Date  . CKD (chronic kidney disease)   . Hyperlipidemia   . Hypertension   . Trigeminal neuralgia 06/03/2015  . Type 2 diabetes mellitus, uncontrolled ([place])    Past Surgical History:  Procedure Laterality Date  . APPENDECTOMY    . TONSILLECTOMY    . VASECTOMY  Jan 2015   Family History  Problem Relation Age of Onset  . Diabetes Sister     type 1  . Dementia Maternal Grandmother   . AAA (abdominal aortic aneurysm) Maternal Grandfather   . Hypertension Maternal Grandfather    Social History  Substance Use Topics  . Smoking status: Former Smoker    Packs/day: 0.50      Types: Cigarettes    Quit date: 02/08/2015  . Smokeless tobacco: Never Used  . Alcohol use No    Interim medical history since last visit reviewed. Allergies and medications reviewed  Review of Systems Per HPI unless specifically indicated above     Objective:    BP 128/82   Pulse 79   Temp 97.6 F (36.4 C) (Oral)   Resp 16   Wt 239 lb 12.8 oz (108.8 kg)   SpO2 98%   BMI 31.64 kg/m   Wt Readings from Last 3 Encounters:  07/09/16 239 lb 12.8 oz (108.8 kg)  01/09/16 256 lb (116.1 kg)  09/10/15 252 lb 4.8 oz (114.4 kg)    Physical Exam  Constitutional: He appears well-developed and well-nourished. No distress.  Weight down 17 pounds since last visit  HENT:  Head: Normocephalic and atraumatic.  Missing several teeth  Eyes: EOM are normal. No scleral icterus.  Neck: No thyromegaly present.  Cardiovascular: Normal rate and regular rhythm.   Pulmonary/Chest: Effort normal and breath sounds normal.  Abdominal: Soft. Bowel sounds are normal. He exhibits no distension.  Musculoskeletal: He exhibits no edema.  Neurological: Coordination normal.  Skin: Skin is warm. No pallor.  Psychiatric: He has a normal mood and affect. His  behavior is normal. Judgment and thought content normal. His mood appears not anxious. He does not exhibit a depressed mood.   Diabetic Foot Form - Detailed   Diabetic Foot Exam - detailed Diabetic Foot exam was performed with the following findings:  Yes 07/09/2016 10:31 AM  Visual Foot Exam completed.:  Yes  Is there a history of foot ulcer?:  No Are the toenails ingrown?:  No Normal Range of Motion:  Yes Pulse Foot Exam completed.:  Yes  Right Dorsalis Pedis:  Present Left Dorsalis Pedis:  Present  Sensory Foot Exam Completed.:  Yes Swelling:  No Semmes-Weinstein Monofilament Test R Site 1-Great Toe:  Pos L Site 1-Great Toe:  Pos  R Site 4:  Pos L Site 4:  Pos  R Site 5:  Pos L Site 5:  Pos        Results for orders placed or performed  in visit on 01/09/16  ANA,IFA RA Diag Pnl w/rflx Tit/Patn  Result Value Ref Range   Rhuematoid fact SerPl-aCnc 54 (H) <14 IU/mL   Anit Nuclear Antibody(ANA) NEG NEGATIVE   Cyclic Citrullin Peptide Ab 43 (H) Units  Hemoglobin A1c  Result Value Ref Range   Hgb A1c MFr Bld 9.1 (H) <5.7 %   Mean Plasma Glucose 214 mg/dL  CBC with Differential/Platelet  Result Value Ref Range   WBC 5.7 3.8 - 10.8 K/uL   RBC 5.03 4.20 - 5.80 MIL/uL   Hemoglobin 14.2 13.2 - 17.1 g/dL   HCT 41.7 38.5 - 50.0 %   MCV 82.9 80.0 - 100.0 fL   MCH 28.2 27.0 - 33.0 pg   MCHC 34.1 32.0 - 36.0 g/dL   RDW 13.6 11.0 - 15.0 %   Platelets 290 140 - 400 K/uL   MPV 10.0 7.5 - 12.5 fL   Neutro Abs 3,420 1,500 - 7,800 cells/uL   Lymphs Abs 1,824 850 - 3,900 cells/uL   Monocytes Absolute 342 200 - 950 cells/uL   Eosinophils Absolute 114 15 - 500 cells/uL   Basophils Absolute 0 0 - 200 cells/uL   Neutrophils Relative % 60 %   Lymphocytes Relative 32 %   Monocytes Relative 6 %   Eosinophils Relative 2 %   Basophils Relative 0 %   Smear Review Criteria for review not met   C-reactive protein  Result Value Ref Range   CRP 13.4 (H) <8.0 mg/L  Lipid panel  Result Value Ref Range   Cholesterol 104 (L) 125 - 200 mg/dL   Triglycerides 99 <150 mg/dL   HDL 21 (L) >=40 mg/dL   Total CHOL/HDL Ratio 5.0 <=5.0 Ratio   VLDL 20 <30 mg/dL   LDL Cholesterol 63 <130 mg/dL  COMPLETE METABOLIC PANEL WITH GFR  Result Value Ref Range   Sodium 139 135 - 146 mmol/L   Potassium 3.6 3.5 - 5.3 mmol/L   Chloride 105 98 - 110 mmol/L   CO2 23 20 - 31 mmol/L   Glucose, Bld 106 (H) 65 - 99 mg/dL   BUN 15 7 - 25 mg/dL   Creat 0.82 0.60 - 1.35 mg/dL   Total Bilirubin 0.8 0.2 - 1.2 mg/dL   Alkaline Phosphatase 60 40 - 115 U/L   AST 18 10 - 40 U/L   ALT 22 9 - 46 U/L   Total Protein 6.6 6.1 - 8.1 g/dL   Albumin 4.1 3.6 - 5.1 g/dL   Calcium 9.1 8.6 - 10.3 mg/dL   GFR, Est African American >89 >=60 mL/min  GFR, Est Non African American  >89 >=60 mL/min  VITAMIN D 25 Hydroxy (Vit-D Deficiency, Fractures)  Result Value Ref Range   Vit D, 25-Hydroxy 26 (L) 30 - 100 ng/mL      Assessment & Plan:   Problem List Items Addressed This Visit      Cardiovascular and Mediastinum   Hypertension (Chronic)    Well-controlled today; continue current plan      Abdominal aortic atherosclerosis (HCC) (Chronic)    Goal LDL is less than 70        Endocrine   Type 2 diabetes mellitus, uncontrolled (HCC) (Chronic)    Well-controlled; check A1c; pleased with [employer] app; foot exam by MD; eye exam referral entered      Relevant Orders   Ambulatory referral to Ophthalmology   Hemoglobin A1c     Genitourinary   CKD (chronic kidney disease) (Chronic)    Try to avoid motrin and advil        Other   Vitamin D deficiency    Check level today and supplement as needed      Relevant Orders   VITAMIN D 25 Hydroxy (Vit-D Deficiency, Fractures)   Obesity (Chronic)    Patient has lost 17 pounds on his own with hard work; Architect given      Medication monitoring encounter    Check labs      Relevant Orders   COMPLETE METABOLIC PANEL WITH GFR   Hyperlipidemia (Chronic)    Goal LDL less than 70      Relevant Orders   Lipid panel       Follow up plan: Return in about 6 months (around 01/08/2017) for twenty minute follow-up with fasting labs.  An after-visit summary was printed and given to the patient at [place].  Please see the patient instructions which may contain other information and recommendations beyond what is mentioned above in the assessment and plan.  Meds ordered this encounter  Medications  . methotrexate (RHEUMATREX) 2.5 MG tablet    Sig: Take 2.5 mg by mouth.  . folic acid (FOLVITE) 1 MG tablet    Sig: Take 1 mg by mouth daily.    Orders Placed This Encounter  Procedures  . COMPLETE METABOLIC PANEL WITH GFR  . Hemoglobin A1c  . Lipid panel  . VITAMIN D 25 Hydroxy (Vit-D Deficiency, Fractures)    . Ambulatory referral to Ophthalmology

## 2016-07-09 NOTE — Patient Instructions (Signed)
Please do see your eye doctor regularly, and have your eyes examined every year (or more often per his or her recommendation) Check your feet every night and let me know right away of any sores, infections, numbness, etc. Try to limit sweets, white bread, white rice, white potatoes It is okay with me for you to not check your fingerstick blood sugars (per [employer] Best Practices), unless you are interested and feel it would be helpful for you We'll have you see the eye doctor Try to limit saturated fats in your diet (bologna, hot dogs, barbeque, cheeseburgers, hamburgers, steak, bacon, sausage, cheese, etc.) and get more fresh fruits, vegetables, and whole grains Keep up the amazing job you are doing!

## 2016-07-09 NOTE — Assessment & Plan Note (Signed)
Goal LDL is less than 70 

## 2016-07-09 NOTE — Assessment & Plan Note (Signed)
Try to avoid motrin and advil

## 2016-07-09 NOTE — Assessment & Plan Note (Signed)
Check level today and supplement as needed 

## 2016-07-09 NOTE — Assessment & Plan Note (Signed)
Well-controlled; check A1c; pleased with [employer] app; foot exam by MD; eye exam referral entered

## 2016-07-09 NOTE — Assessment & Plan Note (Signed)
Goal LDL less than 70 

## 2016-07-10 LAB — HEMOGLOBIN A1C
Hgb A1c MFr Bld: 7.5 % — ABNORMAL HIGH (ref <5.7)
Mean Plasma Glucose: 169 mg/dL

## 2016-07-10 LAB — VITAMIN D 25 HYDROXY (VIT D DEFICIENCY, FRACTURES): Vit D, 25-Hydroxy: 36 ng/mL (ref 30–100)

## 2016-08-20 LAB — HM DIABETES EYE EXAM

## 2016-08-25 ENCOUNTER — Other Ambulatory Visit: Payer: Self-pay | Admitting: Family Medicine

## 2016-08-25 NOTE — Telephone Encounter (Signed)
Last Cr and K+ normal Rx approved 

## 2016-09-12 ENCOUNTER — Other Ambulatory Visit: Payer: Self-pay | Admitting: Family Medicine

## 2016-09-14 ENCOUNTER — Other Ambulatory Visit: Payer: Self-pay | Admitting: Family Medicine

## 2016-09-14 NOTE — Telephone Encounter (Signed)
Seeing rheum now for joint pains; declined the NSAID Reviewed SGPT and lipids; okay for statin

## 2017-01-14 ENCOUNTER — Ambulatory Visit: Payer: Managed Care, Other (non HMO) | Admitting: Family Medicine

## 2017-02-21 ENCOUNTER — Telehealth: Payer: Self-pay | Admitting: Family Medicine

## 2017-02-21 NOTE — Telephone Encounter (Signed)
Patient was due for a visit in July Please contact him and have him come in fasting for a visit soon Thank you

## 2017-02-21 NOTE — Telephone Encounter (Signed)
appt made for 03/11/17

## 2017-03-01 ENCOUNTER — Other Ambulatory Visit: Payer: Self-pay | Admitting: Family Medicine

## 2017-03-01 NOTE — Telephone Encounter (Signed)
Reviewed last SGPT; appt in 10 days Rx approved

## 2017-03-11 ENCOUNTER — Encounter: Payer: Self-pay | Admitting: Family Medicine

## 2017-03-11 ENCOUNTER — Ambulatory Visit (INDEPENDENT_AMBULATORY_CARE_PROVIDER_SITE_OTHER): Payer: Managed Care, Other (non HMO) | Admitting: Family Medicine

## 2017-03-11 VITALS — BP 126/84 | HR 86 | Temp 98.0°F | Ht 73.5 in | Wt 256.9 lb

## 2017-03-11 DIAGNOSIS — R635 Abnormal weight gain: Secondary | ICD-10-CM | POA: Diagnosis not present

## 2017-03-11 DIAGNOSIS — R1011 Right upper quadrant pain: Secondary | ICD-10-CM

## 2017-03-11 DIAGNOSIS — M059 Rheumatoid arthritis with rheumatoid factor, unspecified: Secondary | ICD-10-CM

## 2017-03-11 DIAGNOSIS — E1165 Type 2 diabetes mellitus with hyperglycemia: Secondary | ICD-10-CM | POA: Diagnosis not present

## 2017-03-11 DIAGNOSIS — I10 Essential (primary) hypertension: Secondary | ICD-10-CM

## 2017-03-11 DIAGNOSIS — E782 Mixed hyperlipidemia: Secondary | ICD-10-CM

## 2017-03-11 DIAGNOSIS — Z5181 Encounter for therapeutic drug level monitoring: Secondary | ICD-10-CM | POA: Diagnosis not present

## 2017-03-11 MED ORDER — INSULIN NPH (HUMAN) (ISOPHANE) 100 UNIT/ML ~~LOC~~ SUSP: 30.0000 [IU] | Freq: Every day | SUBCUTANEOUS | 5 refills | Status: DC

## 2017-03-11 MED ORDER — INSULIN REGULAR HUMAN 100 UNIT/ML IJ SOLN: 30.0000 [IU] | Freq: Three times a day (TID) | INTRAMUSCULAR | 5 refills | Status: DC

## 2017-03-11 NOTE — Progress Notes (Signed)
BP 126/84 (BP Location: Left Arm, Patient Position: Sitting, Cuff Size: Large)   Pulse 86   Temp 98 F (36.7 C) (Oral)   Ht 6' 1.5" (1.867 m)   Wt 256 lb 14.4 oz (116.5 kg)   SpO2 98%   BMI 33.43 kg/m    Subjective:    Patient ID: Nathan Rojas, male    DOB: 1975/08/08, 41 y.o.   MRN: [id number]  HPI: [name] is a 41 y.o. male  Chief Complaint  Patient presents with  . Diabetes    Pt states that sugars have been out of control has had to resume insulin   . Medication Refill    HPI Patient is here for f/u Blood sugars have been really going up; despite same diet, gaining weight and sugars going up, 350 range Got some insulin and using 30 units of the R at every meal and 30 units of the N every morning; that is now keeping his sugars around 90-160 He would like to see nutritionist He quit taking Invokana and metformin  HTN; uses pepper instead of salt when seasoning his food  Obesity; he has gained 17 pounds; not sure of any thyroid disease in the family  RA; still taking MTX  Depression screen [facility] 2/9 03/11/2017 07/09/2016 01/09/2016 09/05/2015  Decreased Interest 0 0 0 0  Down, Depressed, Hopeless 0 0 0 0  PHQ - 2 Score 0 0 0 0    Relevant past medical, surgical, family and social history reviewed Past Medical History:  Diagnosis Date  . CKD (chronic kidney disease)   . Hyperlipidemia   . Hypertension   . Rheumatoid arthritis, 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HCC) 01/23/2016  . Trigeminal neuralgia 06/03/2015  . Type 2 diabetes mellitus, uncontrolled (HCC)    Past Surgical History:  Procedure Laterality Date  . APPENDECTOMY    . TONSILLECTOMY    . VASECTOMY  Jan 2015   Family History  Problem Relation Age of Onset  . Diabetes Sister        type 1  . Dementia Maternal Grandmother   . AAA (abdominal aortic aneurysm) Maternal Grandfather   . Hypertension Maternal Grandfather    Social History   Tobacco Use  . Smoking status: Former Smoker    Packs/day: 0.50   Types: Cigarettes    Last attempt to quit: 02/08/2015    Years since quitting: 2.0  . Smokeless tobacco: Never Used  Substance Use Topics  . Alcohol use: No  . Drug use: No    Interim medical history since last visit reviewed. Allergies and medications reviewed  Review of Systems  Constitutional: Positive for unexpected weight change.  Gastrointestinal: Positive for abdominal pain (RUQ).   Per HPI unless specifically indicated above     Objective:    BP 126/84 (BP Location: Left Arm, Patient Position: Sitting, Cuff Size: Large)   Pulse 86   Temp 98 F (36.7 C) (Oral)   Ht 6' 1.5" (1.867 m)   Wt 256 lb 14.4 oz (116.5 kg)   SpO2 98%   BMI 33.43 kg/m   Wt Readings from Last 3 Encounters:  03/11/17 256 lb 14.4 oz (116.5 kg)  07/09/16 239 lb 12.8 oz (108.8 kg)  01/09/16 256 lb (116.1 kg)    Physical Exam  Constitutional: He appears well-developed and well-nourished. No distress.  HENT:  Head: Normocephalic and atraumatic.  Missing several teeth  Eyes: EOM are normal. No scleral icterus.  Neck: No thyromegaly present.  Cardiovascular: Normal rate and  regular rhythm.  Pulmonary/Chest: Effort normal and breath sounds normal.  Abdominal: Soft. Bowel sounds are normal. He exhibits no distension. There is no tenderness. There is negative Murphy's sign.  Musculoskeletal: He exhibits no edema.  Neurological: Coordination normal.  Skin: Skin is warm. No pallor.  Psychiatric: He has a normal mood and affect. His behavior is normal. Judgment and thought content normal. His mood appears not anxious. He does not exhibit a depressed mood.   Diabetic Foot Form - Detailed   Diabetic Foot Exam - detailed Diabetic Foot exam was performed with the following findings:  Yes 03/11/2017 10:35 AM  Visual Foot Exam completed.:  Yes  Pulse Foot Exam completed.:  Yes  Right Dorsalis Pedis:  Present Left Dorsalis Pedis:  Present  Sensory Foot Exam Completed.:  Yes Semmes-Weinstein  Monofilament Test R Site 1-Great Toe:  Pos L Site 1-Great Toe:  Pos        Results for orders placed or performed in visit on 08/24/16  HM DIABETES EYE EXAM  Result Value Ref Range   HM Diabetic Eye Exam No Retinopathy No Retinopathy      Assessment & Plan:   Problem List Items Addressed This Visit      Cardiovascular and Mediastinum   Hypertension (Chronic)    Well-controlled; working on weight loss, limit salt        Endocrine   Type 2 diabetes mellitus, uncontrolled (HCC) (Chronic)    Check A1c and glucose; patient is on insulin; offered referral to an endocrinologist; will refer to nutritionist for eating properly      Relevant Medications   insulin regular (NOVOLIN R RELION) 100 units/mL injection   insulin NPH Human (NOVOLIN N RELION) 100 UNIT/ML injection   Other Relevant Orders   Ambulatory referral to diabetic education   Microalbumin / creatinine urine ratio   Lipid panel   Hemoglobin A1c     Musculoskeletal and Integument   Rheumatoid arthritis, 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HCC)    Patient is still taking MTX      Relevant Medications   naproxen sodium (ALEVE) 220 MG tablet     Other   Medication monitoring encounter    Check liver and kidneys      Relevant Orders   COMPLETE METABOLIC PANEL WITH GFR   Hyperlipidemia (Chronic)    Check lipids (fasting); limit foods from cows and pigs      Relevant Orders   Lipid panel    Other Visit Diagnoses    Weight gain, abnormal    -  Primary   Relevant Orders   TSH   RUQ pain       Relevant Orders   US ABDOMEN LIMITED RUQ       Follow up plan: Return in about 3 months (around 06/09/2017) for twenty minute follow-up with fasting labs.  An after-visit summary was printed and given to the patient at check-out.  Please see the patient instructions which may contain other information and recommendations beyond what is mentioned above in the assessment and plan.  Meds ordered this encounter  Medications  . insulin  regular (NOVOLIN R RELION) 100 units/mL injection    Sig: Inject 0.3 mLs (30 Units total) into the skin 3 (three) times daily before meals.    Dispense:  10 mL    Refill:  5  . insulin NPH Human (NOVOLIN N RELION) 100 UNIT/ML injection    Sig: Inject 0.3 mLs (30 Units total) into the skin daily before breakfast.    Dispense:  10 mL    Refill:  5    Orders Placed This Encounter  Procedures  . US ABDOMEN LIMITED RUQ  . Microalbumin / creatinine urine ratio  . Lipid panel  . Hemoglobin A1c  . COMPLETE METABOLIC PANEL WITH GFR  . TSH  . Ambulatory referral to diabetic education

## 2017-03-11 NOTE — Assessment & Plan Note (Signed)
Check lipids (fasting); limit foods from cows and pigs

## 2017-03-11 NOTE — Assessment & Plan Note (Signed)
Check liver and kidneys 

## 2017-03-11 NOTE — Patient Instructions (Addendum)
We'll get labs today Try to limit saturated fats in your diet (bologna, hot dogs, barbeque, cheeseburgers, hamburgers, steak, bacon, sausage, cheese, etc.) and get more fresh fruits, vegetables, and whole grains We'll have you see the diabetic educator Check feet every night Check sugars 3x a day If the pain in the right upper quadrant near your gallbladder gets severe, go to the ER

## 2017-03-11 NOTE — Assessment & Plan Note (Signed)
Patient is still taking MTX

## 2017-03-11 NOTE — Assessment & Plan Note (Signed)
Check A1c and glucose; patient is on insulin; offered referral to an endocrinologist; will refer to nutritionist for eating properly

## 2017-03-11 NOTE — Assessment & Plan Note (Signed)
Well-controlled; working on weight loss, limit salt

## 2017-03-12 LAB — HEMOGLOBIN A1C
Hgb A1c MFr Bld: 11.6 % of total Hgb — ABNORMAL HIGH (ref <5.7)
Mean Plasma Glucose: 286 (calc)
eAG (mmol/L): 15.9 (calc)

## 2017-03-12 LAB — COMPLETE METABOLIC PANEL WITH GFR
AG Ratio: 2 (calc) (ref 1.0–2.5)
ALT: 33 U/L (ref 9–46)
AST: 23 U/L (ref 10–40)
Albumin: 4.7 g/dL (ref 3.6–5.1)
Alkaline phosphatase (APISO): 60 U/L (ref 40–115)
BUN: 18 mg/dL (ref 7–25)
CO2: 27 mmol/L (ref 20–32)
Calcium: 9.2 mg/dL (ref 8.6–10.3)
Chloride: 102 mmol/L (ref 98–110)
Creat: 0.89 mg/dL (ref 0.60–1.35)
GFR, Est African American: 121 mL/min/{1.73_m2} (ref >=60)
GFR, Est Non African American: 105 mL/min/{1.73_m2} (ref >=60)
Globulin: 2.4 g/dL (calc) (ref 1.9–3.7)
Glucose, Bld: 168 mg/dL — ABNORMAL HIGH (ref 65–99)
Potassium: 4.4 mmol/L (ref 3.5–5.3)
Sodium: 138 mmol/L (ref 135–146)
Total Bilirubin: 1.1 mg/dL (ref 0.2–1.2)
Total Protein: 7.1 g/dL (ref 6.1–8.1)

## 2017-03-12 LAB — LIPID PANEL
Cholesterol: 133 mg/dL (ref <200)
HDL: 33 mg/dL — ABNORMAL LOW (ref >40)
LDL Cholesterol (Calc): 81 mg/dL (calc)
Non-HDL Cholesterol (Calc): 100 mg/dL (calc) (ref <130)
Total CHOL/HDL Ratio: 4 (calc) (ref <5.0)
Triglycerides: 94 mg/dL (ref <150)

## 2017-03-12 LAB — TSH: TSH: 2.21 mIU/L (ref 0.40–4.50)

## 2017-03-12 LAB — MICROALBUMIN / CREATININE URINE RATIO
Creatinine, Urine: 119 mg/dL (ref 20–320)
Microalb Creat Ratio: 4 mcg/mg creat (ref <30)
Microalb, Ur: 0.5 mg/dL

## 2017-03-14 ENCOUNTER — Telehealth: Payer: Self-pay

## 2017-03-14 NOTE — Telephone Encounter (Signed)
-----   Message from Kerman PasseyMelinda P Lada, MD sent at 03/12/2017  7:38 AM EST ----- Please let the pt know that he is not spilling excessive protein through his kidneys (good news); his cholesterol panel shows a very good total cholesterol, triglycerides, and LDL; however his HDL is still too low, but it is at least better than before; weight loss and exercise will help improve that even further; his blood sugar average has really gone up; we'll need to recheck this in 3 months; please have him monitor his blood sugar 3x a day and call us next Wed or Thursday with readings so I can adjust his insulin further and possible add back another pill; thank you

## 2017-03-14 NOTE — Telephone Encounter (Signed)
Called pt informed him of the information below per Dr.Lada, pt gave verbal understanding and will call back later this week with sugar readings.

## 2017-03-15 ENCOUNTER — Telehealth: Payer: Self-pay

## 2017-03-15 MED ORDER — INSULIN REGULAR HUMAN 100 UNIT/ML IJ SOLN: 30.0000 [IU] | Freq: Three times a day (TID) | INTRAMUSCULAR | 3 refills | Status: DC

## 2017-03-15 MED ORDER — INSULIN NPH (HUMAN) (ISOPHANE) 100 UNIT/ML ~~LOC~~ SUSP: 30.0000 [IU] | Freq: Every day | SUBCUTANEOUS | 3 refills | Status: DC

## 2017-03-15 NOTE — Telephone Encounter (Signed)
Novolin N will need to be changed as well. Thanks!

## 2017-03-15 NOTE — Telephone Encounter (Signed)
Okay; new Rxs sent

## 2017-03-15 NOTE — Telephone Encounter (Signed)
Incoming request from [facility] to change Novolin R to Humilin R as this is what is preferred by the patient's insurance. New RX needed. Please advise.

## 2017-03-18 ENCOUNTER — Encounter: Payer: Self-pay | Admitting: Family Medicine

## 2017-03-18 ENCOUNTER — Ambulatory Visit
Admission: RE | Admit: 2017-03-18 | Discharge: 2017-03-18 | Disposition: A | Discharge status: Home / Self Care | Payer: Managed Care, Other (non HMO) | Source: Ambulatory Visit | Attending: Family Medicine | Admitting: Family Medicine

## 2017-03-18 ENCOUNTER — Other Ambulatory Visit: Payer: Self-pay | Admitting: Family Medicine

## 2017-03-18 DIAGNOSIS — R932 Abnormal findings on diagnostic imaging of liver and biliary tract: Secondary | ICD-10-CM | POA: Insufficient documentation

## 2017-03-18 DIAGNOSIS — K824 Cholesterolosis of gallbladder: Secondary | ICD-10-CM | POA: Insufficient documentation

## 2017-03-18 DIAGNOSIS — R1011 Right upper quadrant pain: Secondary | ICD-10-CM | POA: Insufficient documentation

## 2017-03-18 DIAGNOSIS — K760 Fatty (change of) liver, not elsewhere classified: Secondary | ICD-10-CM | POA: Insufficient documentation

## 2017-03-18 DIAGNOSIS — E1165 Type 2 diabetes mellitus with hyperglycemia: Secondary | ICD-10-CM

## 2017-03-18 NOTE — Progress Notes (Signed)
Refer to surgeon Order additional labs, then consider referral to GI [facility]

## 2017-03-18 NOTE — Assessment & Plan Note (Signed)
Refer to surgeon. 

## 2017-03-21 ENCOUNTER — Telehealth: Payer: Self-pay

## 2017-03-21 DIAGNOSIS — R932 Abnormal findings on diagnostic imaging of liver and biliary tract: Secondary | ICD-10-CM

## 2017-03-21 NOTE — Telephone Encounter (Signed)
Called pt informed him of lab results below per Dr.Lada. Pt gave verbal understanding. Lab orders placed.

## 2017-03-21 NOTE — Telephone Encounter (Signed)
-----   Message from Kerman PasseyMelinda P Lada, MD sent at 03/18/2017  4:59 PM EST ----- Please let the patient know that he has several polyps in his gallbladder, but no acute infection. I'd like to have him see a general surgeon about his symptoms. This is not an emergency. However, if he develops worsening pain before seeing the surgeon, please do go to the ER to get checked out. His liver also appears fatty, but other liver issues can cause this. Since he has such significant diabetes, I'd like do some additional labs and then have him see a gastroenterologist. Please order ferritin, HH, ceruloplasmin, and acute hepatitis panel (diagnosis abnormal liver appearance).

## 2017-04-01 ENCOUNTER — Encounter: Payer: Self-pay | Admitting: *Deleted

## 2017-04-01 ENCOUNTER — Encounter: Payer: Managed Care, Other (non HMO) | Attending: Family Medicine | Admitting: *Deleted

## 2017-04-01 ENCOUNTER — Other Ambulatory Visit: Payer: Self-pay

## 2017-04-01 VITALS — BP 126/88 | Ht 73.5 in | Wt 270.2 lb

## 2017-04-01 DIAGNOSIS — Z713 Dietary counseling and surveillance: Secondary | ICD-10-CM | POA: Insufficient documentation

## 2017-04-01 DIAGNOSIS — R1011 Right upper quadrant pain: Secondary | ICD-10-CM

## 2017-04-01 DIAGNOSIS — R932 Abnormal findings on diagnostic imaging of liver and biliary tract: Secondary | ICD-10-CM

## 2017-04-01 DIAGNOSIS — E119 Type 2 diabetes mellitus without complications: Secondary | ICD-10-CM

## 2017-04-01 DIAGNOSIS — E1165 Type 2 diabetes mellitus with hyperglycemia: Secondary | ICD-10-CM | POA: Insufficient documentation

## 2017-04-01 NOTE — Patient Instructions (Addendum)
Check blood sugars 4 x day before each meal and before bed every day Bring blood sugar records to the next appointment  Exercise: Begin walking  for  15  minutes   3  days a week and gradually increase to 30 minutes 5 days a week  Eat 3 meals day,  1-2  snacks a day Space meals 4-6 hours apart Continue to limit fried foods  Carry fast acting glucose and a snack at all times Rotate injection sites  Return for appointment on:  Friday April 22, 2017 [facility] 9:00 [facility]

## 2017-04-01 NOTE — Progress Notes (Signed)
Diabetes Self-Management Education  Visit Type: First/Initial  Appt. Start Time: 1110 Appt. End Time: 1225  04/01/2017  [name], identified by name and date of birth, is a 42 y.o. male with a diagnosis of Diabetes: Type 2.   ASSESSMENT  Blood pressure 126/88, height 6' 1.5" (1.867 m), weight 270 lb 3.2 oz (122.6 kg). Body mass index is 35.17 kg/m.  Diabetes Self-Management Education - 04/01/17 1322      Visit Information   Visit Type  First/Initial      Initial Visit   Diabetes Type  Type 2    Are you currently following a meal plan?  Yes    What type of meal plan do you follow?  less fried foods    Are you taking your medications as prescribed?  Yes    Date Diagnosed  2 years ago      Health Coping   How would you rate your overall health?  Good      Psychosocial Assessment   Patient Belief/Attitude about Diabetes  Motivated to manage diabetes    Self-care barriers  None    Self-management support  Doctor's office;Family    Patient Concerns  Nutrition/Meal planning;Medication;Glycemic Control;Monitoring;Weight Control;Healthy Lifestyle    Special Needs  None    Preferred Learning Style  Visual;Hands on    Learning Readiness  Change in progress    How often do you need to have someone help you when you read instructions, pamphlets, or other written materials from your doctor or pharmacy?  1 - Never    What is the last grade level you completed in school?  GED      Pre-Education Assessment   Patient understands the diabetes disease and treatment process.  Needs Review    Patient understands incorporating nutritional management into lifestyle.  Needs Instruction    Patient undertands incorporating physical activity into lifestyle.  Needs Instruction    Patient understands using medications safely.  Needs Review    Patient understands monitoring blood glucose, interpreting and using results  Needs Review    Patient understands prevention, detection, and treatment  of acute complications.  Needs Review    Patient understands prevention, detection, and treatment of chronic complications.  Needs Review    Patient understands how to develop strategies to address psychosocial issues.  Needs Instruction    Patient understands how to develop strategies to promote health/change behavior.  Needs Instruction      Complications   Last HgB A1C per patient/outside source  11.6 % 03/11/17    How often do you check your blood sugar?  > 4 times/day    Fasting Blood glucose range (mg/dL)  16-109;604-540;[id number];130-179;180-200 FBG's 120-188 mg/dL    Postprandial Blood glucose range (mg/dL)  -- ; pre-lunch 47-82998-142 mg/dL; pre-supper 56-21381-145 mg/dL    Number of hypoglycemic episodes per month  1    Can you tell when your blood sugar is low?  Yes    What do you do if your blood sugar is low?  "eat some peppermints"    Have you had a dilated eye exam in the past 12 months?  Yes    Have you had a dental exam in the past 12 months?  Yes    Are you checking your feet?  Yes    How many days per week are you checking your feet?  7      Dietary Intake   Breakfast  hard boiled eggs    Lunch  grilled pork  chop or grilled chicken, green beans    Dinner  meat with green beans, brussel sprouts, broccoli, corn, peas, beans, salads    Beverage(s)  Diet Mt Dew      Exercise   Exercise Type  ADL's      Patient Education   Previous Diabetes Education  No    Disease state   Definition of diabetes, type 1 and 2, and the diagnosis of diabetes;Explored patient's options for treatment of their diabetes    Nutrition management   Role of diet in the treatment of diabetes and the relationship between the three main macronutrients and blood glucose level;Carbohydrate counting;Reviewed blood glucose goals for pre and post meals and how to evaluate the patients' food intake on their blood glucose level.    Physical activity and exercise   Role of exercise on diabetes management, blood pressure control and  cardiac health.    Medications  Taught/reviewed insulin injection, site rotation, insulin storage and needle disposal.;Reviewed patients medication for diabetes, action, purpose, timing of dose and side effects.    Monitoring  Purpose and frequency of SMBG.;Taught/discussed recording of test results and interpretation of SMBG.;Identified appropriate SMBG and/or A1C goals.    Acute complications  Taught treatment of hypoglycemia - the 15 rule.    Chronic complications  Relationship between chronic complications and blood glucose control    Psychosocial adjustment  Identified and addressed patients feelings and concerns about diabetes      Individualized Goals (developed by patient)   Reducing Risk  Improve blood sugars Decrease medications Prevent diabetes complications Lose weight Lead a healthier lifestyle Become more fit     Outcomes   Expected Outcomes  Demonstrated interest in learning. Expect positive outcomes    Future DMSE  3 weeks       Individualized Plan for Diabetes Self-Management Training:   Learning Objective:  Patient will have a greater understanding of diabetes self-management. Patient education plan is to attend individual and/or group sessions per assessed needs and concerns.   Plan:   Patient Instructions  Check blood sugars 4 x day before each meal and before bed every day Bring blood sugar records to the next appointment  Exercise: Begin walking  for  15  minutes   3  days a week and gradually increase to 30 minutes 5 days a week  Eat 3 meals day,  1-2  snacks a day Space meals 4-6 hours apart Continue to limit fried foods  Carry fast acting glucose and a snack at all times Rotate injection sites  Return for appointment on:  Friday April 22, 2017 at 9:00 am with [facility] (dietitian)   Expected Outcomes:  Demonstrated interest in learning. Expect positive outcomes  Education material provided:  General Meal Planning Guidelines Simple Meal  Plan Glucose tablets Symptoms, causes and treatments of Hypoglycemia Getting Started Booklet - Insulin (BD)  If problems or questions, patient to contact team via:  Sharion Settler, RN, CCM, CDE [phone]  Future DSME appointment: 3 weeks April 22, 2017 at 9:00 am with the dietitian

## 2017-04-07 ENCOUNTER — Encounter: Payer: Self-pay | Admitting: Family Medicine

## 2017-04-08 ENCOUNTER — Ambulatory Visit (INDEPENDENT_AMBULATORY_CARE_PROVIDER_SITE_OTHER): Payer: Managed Care, Other (non HMO) | Admitting: Surgery

## 2017-04-08 ENCOUNTER — Encounter: Payer: Self-pay | Admitting: Surgery

## 2017-04-08 VITALS — BP 128/88 | HR 77 | Temp 98.1°F | Ht 73.5 in | Wt 263.0 lb

## 2017-04-08 DIAGNOSIS — K824 Cholesterolosis of gallbladder: Secondary | ICD-10-CM

## 2017-04-08 LAB — HEPATITIS PANEL, ACUTE
Hep A IgM: NONREACTIVE
Hep B C IgM: NONREACTIVE
Hepatitis B Surface Ag: NONREACTIVE
Hepatitis C Ab: NONREACTIVE
SIGNAL TO CUT-OFF: 0.01 (ref <1.00)

## 2017-04-08 LAB — CERULOPLASMIN: Ceruloplasmin: 27 mg/dL (ref 18–36)

## 2017-04-08 LAB — FERRITIN: Ferritin: 341 ng/mL (ref 20–380)

## 2017-04-08 LAB — HEMOCHROMATOSIS DNA-PCR(C282Y,H63D)

## 2017-04-08 NOTE — Patient Instructions (Signed)
You have requested to have your gallbladder removed. This will be done on 05/06/2017 at [facility] with Dr. Jason Davis.  You will most likely be out of work 1-2 weeks for this surgery. You will return after your post-op appointment with a lifting restriction for approximately 4 more weeks.  You will be able to eat anything you would like to following surgery. But, start by eating a bland diet and advance this as tolerated. The Gallbladder diet is below, please go as closely by this diet as possible prior to surgery to avoid any further attacks.  Please see the (blue)pre-care form that you have been given today. If you have any questions, please call our office.  Laparoscopic Cholecystectomy Laparoscopic cholecystectomy is surgery to remove the gallbladder. The gallbladder is located in the upper right part of the abdomen, behind the liver. It is a storage sac for bile, which is produced in the liver. Bile aids in the digestion and absorption of fats. Cholecystectomy is often done for inflammation of the gallbladder (cholecystitis). This condition is usually caused by a buildup of gallstones (cholelithiasis) in the gallbladder. Gallstones can block the flow of bile, and that can result in inflammation and pain. In severe cases, emergency surgery may be required. If emergency surgery is not required, you will have time to prepare for the procedure. Laparoscopic surgery is an alternative to open surgery. Laparoscopic surgery has a shorter recovery time. Your common bile duct may also need to be examined during the procedure. If stones are found in the common bile duct, they may be removed. LET YOUR HEALTH CARE PROVIDER KNOW ABOUT:  Any allergies you have.  All medicines you are taking, including vitamins, herbs, eye drops, creams, and over-the-counter medicines.  Previous problems you or members of your family have had with the use of anesthetics.  Any blood disorders you have.  Previous  surgeries you have had.    Any medical conditions you have. RISKS AND COMPLICATIONS Generally, this is a safe procedure. However, problems may occur, including:  Infection.  Bleeding.  Allergic reactions to medicines.  Damage to other structures or organs.  A stone remaining in the common bile duct.  A bile leak from the cyst duct that is clipped when your gallbladder is removed.  The need to convert to open surgery, which requires a larger incision in the abdomen. This may be necessary if your surgeon thinks that it is not safe to continue with a laparoscopic procedure. BEFORE THE PROCEDURE  Ask your health care provider about:  Changing or stopping your regular medicines. This is especially important if you are taking diabetes medicines or blood thinners.  Taking medicines such as aspirin and ibuprofen. These medicines can thin your blood. Do not take these medicines before your procedure if your health care provider instructs you not to.  Follow instructions from your health care provider about eating or drinking restrictions.  Let your health care provider know if you develop a cold or an infection before surgery.  Plan to have someone take you home after the procedure.  Ask your health care provider how your surgical site will be marked or identified.  You may be given antibiotic medicine to help prevent infection. PROCEDURE  To reduce your risk of infection:  Your health care team will wash or sanitize their hands.  Your skin will be washed with soap.  An IV tube may be inserted into one of your veins.  You will be given a medicine to   make you fall asleep (general anesthetic).  A breathing tube will be placed in your mouth.  The surgeon will make several small cuts (incisions) in your abdomen.  A thin, lighted tube (laparoscope) that has a tiny camera on the end will be inserted through one of the small incisions. The camera on the laparoscope will send a  picture to a TV screen (monitor) in the operating room. This will give the surgeon a good view inside your abdomen.  A gas will be pumped into your abdomen. This will expand your abdomen to give the surgeon more room to perform the surgery.  Other tools that are needed for the procedure will be inserted through the other incisions. The gallbladder will be removed through one of the incisions.  After your gallbladder has been removed, the incisions will be closed with stitches (sutures), staples, or skin glue.  Your incisions may be covered with a bandage (dressing). The procedure may vary among health care providers and hospitals. AFTER THE PROCEDURE  Your blood pressure, heart rate, breathing rate, and blood oxygen level will be monitored often until the medicines you were given have worn off.  You will be given medicines as needed to control your pain.   This information is not intended to replace advice given to you by your health care provider. Make sure you discuss any questions you have with your health care provider.   Document Released: 03/15/2005 Document Revised: 12/04/2014 Document Reviewed: 10/25/2012 Elsevier Interactive Patient Education 2016 Elsevier Inc.   Low-Fat Diet for Gallbladder Conditions A low-fat diet can be helpful if you have pancreatitis or a gallbladder condition. With these conditions, your pancreas and gallbladder have trouble digesting fats. A healthy eating plan with less fat will help rest your pancreas and gallbladder and reduce your symptoms. WHAT DO I NEED TO KNOW ABOUT THIS DIET?  Eat a low-fat diet.  Reduce your fat intake to less than 20-30% of your total daily calories. This is less than 50-60 g of fat per day.  Remember that you need some fat in your diet. Ask your dietician what your daily goal should be.  Choose nonfat and low-fat healthy foods. Look for the words "nonfat," "low fat," or "fat free."  As a guide, look on the label and  choose foods with less than 3 g of fat per serving. Eat only one serving.  Avoid alcohol.  Do not smoke. If you need help quitting, talk with your health care provider.  Eat small frequent meals instead of three large heavy meals. WHAT FOODS CAN I EAT? Grains Include healthy grains and starches such as potatoes, wheat bread, fiber-rich cereal, and Ebrahim rice. Choose whole grain options whenever possible. In adults, whole grains should account for 45-65% of your daily calories.  Fruits and Vegetables Eat plenty of fruits and vegetables. Fresh fruits and vegetables add fiber to your diet. Meats and Other Protein Sources Eat lean meat such as chicken and pork. Trim any fat off of meat before cooking it. Eggs, fish, and beans are other sources of protein. In adults, these foods should account for 10-35% of your daily calories. Dairy Choose low-fat milk and dairy options. Dairy includes fat and protein, as well as calcium.  Fats and Oils Limit high-fat foods such as fried foods, sweets, baked goods, sugary drinks.  Other Creamy sauces and condiments, such as mayonnaise, can add extra fat. Think about whether or not you need to use them, or use smaller amounts or low fat  options. WHAT FOODS ARE NOT RECOMMENDED?  High fat foods, such as:  [employer].  Ice cream.  [place] toast.  Sweet rolls.  Pizza.  Cheese bread.  Foods covered with batter, butter, creamy sauces, or cheese.  Fried foods.  Sugary drinks and desserts.  Foods that cause gas or bloating   This information is not intended to replace advice given to you by your health care provider. Make sure you discuss any questions you have with your health care provider.   Document Released: 03/20/2013 Document Reviewed: 03/20/2013 Elsevier Interactive Patient Education [employer].

## 2017-04-08 NOTE — H&P (View-Only) (Signed)
Surgical Clinic History and Physical  Referring provider:  Kerman Passey, MD [address] [place], [place] [address]  HISTORY OF PRESENT ILLNESS (HPI):  42 y.o. male presents for evaluation of gallbladder polyps. Patient reports intermittent RUQ abdominal pain, though he says he's unsure whether his pain tends to follow any types of food in particular. RUQ ultrasound was ordered for evaluation of his gallbladder and demonstrated gallbladder polyps. Patient denies any N/V, fever/chills, diarrhea, constipation, CP, or SOB.  Until the past month, patient states he had been controlling his diabetes with dietary restraint alone, but his blood glucose levels over the past several months became increasingly elevated to 200's - 300's despite diet, and his HbA1C in 02/2017 was 10.6. Since, he was prescribed insulin (including BID long-acting insulin) with much improved control of his blood glucose, now reportedly typically ~120. Patient also describes he has purposefully lost weight recently by exercising more and quit smoking 1 ppd 2 years ago.  PAST MEDICAL HISTORY (PMH):  Past Medical History:  Diagnosis Date  . CKD (chronic kidney disease)   . Hyperlipidemia   . Hypertension   . Rheumatoid arthritis, 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HCC) 01/23/2016  . Trigeminal neuralgia 06/03/2015  . Type 2 diabetes mellitus, uncontrolled (HCC)      PAST SURGICAL HISTORY (PSH):  Past Surgical History:  Procedure Laterality Date  . APPENDECTOMY    . TONSILLECTOMY    . VASECTOMY  Jan 2015     MEDICATIONS:  Prior to Admission medications   Medication Sig Start Date End Date Taking? Authorizing Provider  aspirin EC 81 MG tablet Take 81 mg by mouth daily.   Yes [provider]  atorvastatin (LIPITOR) 40 MG tablet TAKE ONE-HALF (1/2) TABLET AT BEDTIME 03/01/17  Yes Lada, Janit Bern, MD  FLUCELVAX QUADRIVALENT 0.5 ML SUSY  02/05/17  Yes [provider]  folic acid (FOLVITE) 1 MG tablet Take 1 mg  by mouth daily. 06/06/16  Yes [provider]  insulin NPH Human (HUMULIN N) 100 UNIT/ML injection Inject 0.3 mLs (30 Units total) into the skin daily before breakfast. Patient taking differently: Inject 30 Units into the skin at bedtime.  03/15/17  Yes Lada, Janit Bern, MD  insulin regular (HUMULIN R) 100 units/mL injection Inject 0.3 mLs (30 Units total) into the skin 3 (three) times daily before meals. 03/15/17  Yes Lada, Janit Bern, MD  methotrexate (RHEUMATREX) 2.5 MG tablet Take 10 mg by mouth once a week.  06/17/16  Yes [provider]  naproxen sodium (ALEVE) 220 MG tablet Take 1 tablet (220 mg total) by mouth 2 (two) times daily as needed. 03/11/17  Yes Lada, Janit Bern, MD  VASCEPA 1 g CAPS TAKE 2 CAPSULES TWICE A DAY (THIS REPLACES FISH OIL) 09/13/16  Yes Lada, Janit Bern, MD     ALLERGIES:  No Known Allergies   SOCIAL HISTORY:  Social History   Socioeconomic History  . Marital status: Married    Spouse name: Not on file  . Number of children: Not on file  . Years of education: Not on file  . Highest education level: Not on file  Social Needs  . Financial resource strain: Not on file  . Food insecurity - worry: Not on file  . Food insecurity - inability: Not on file  . Transportation needs - medical: Not on file  . Transportation needs - non-medical: Not on file  Occupational History  . Not on file  Tobacco Use  . Smoking status: Former Smoker  Packs/day: 1.00    Years: 25.00    Pack years: 25.00    Types: Cigarettes    Last attempt to quit: 02/08/2015    Years since quitting: 2.1  . Smokeless tobacco: Never Used  Substance and Sexual Activity  . Alcohol use: Yes    Alcohol/week: 1.8 oz    Types: 2 Cans of beer, 1 Shots of liquor per week    Comment: 1-4 drinks/week  . Drug use: No  . Sexual activity: Yes  Other Topics Concern  . Not on file  Social History Narrative  . Not on file    The patient currently resides (home / rehab facility /  nursing home): Home The patient normally is (ambulatory / bedbound): Ambulatory  FAMILY HISTORY:  Family History  Problem Relation Age of Onset  . Diabetes Sister        type 1  . Dementia Maternal Grandmother   . AAA (abdominal aortic aneurysm) Maternal Grandfather   . Hypertension Maternal Grandfather     Otherwise negative/non-contributory.  REVIEW OF SYSTEMS:  Constitutional: denies any other weight loss, fever, chills, or sweats  Eyes: denies any other vision changes, history of eye injury  ENT: denies sore throat, hearing problems  Respiratory: denies shortness of breath, wheezing  Cardiovascular: denies chest pain, palpitations  Gastrointestinal: abdominal pain, N/V, and bowel function as per HPI Musculoskeletal: denies any other joint pains or cramps  Skin: Denies any other rashes or skin discolorations Neurological: denies any other headache, dizziness, weakness  Psychiatric: Denies any other depression, anxiety   All other review of systems were otherwise negative   VITAL SIGNS:  @VSRANGES @     Height: 6' 1.5" (186.7 cm) Weight: 263 lb (119.3 kg) BMI (Calculated): 34.22   PHYSICAL EXAM:  Constitutional:  -- Normal body habitus  -- Awake, alert, and oriented x3  Eyes:  -- Pupils equally round and reactive to light  -- No scleral icterus  Ear, nose, throat:  -- No jugular venous distension -- No nasal drainage, bleeding Pulmonary:  -- No crackles  -- Equal breath sounds bilaterally -- Breathing non-labored at rest Cardiovascular:  -- S1, S2 present  -- No pericardial rubs  Gastrointestinal:  -- Abdomen soft and non-distended with mild RUQ abdominal tenderness to palpation, no guarding/rebound  -- No abdominal masses appreciated, pulsatile or otherwise  Musculoskeletal and Integumentary:  -- Wounds or skin discoloration: None appreciated -- Extremities: B/L UE and LE FROM, hands and feet warm, no edema  Neurologic:  -- Motor function: Intact and  symmetric -- Sensation: Intact and symmetric  Labs:  CBC Latest Ref Rng & Units 01/09/2016 09/05/2015 04/30/2015  WBC 3.8 - 10.8 K/uL 5.7 8.6 7.2  Hemoglobin 13.2 - 17.1 g/dL 14.714.2 82.915.3 56.215.5  Hematocrit 38.5 - 50.0 % 41.7 45.3 44.6  Platelets 140 - 400 K/uL 290 286 282   CMP Latest Ref Rng & Units 03/11/2017 07/09/2016 01/09/2016  Glucose 65 - 99 mg/dL 130(Q168(H) 657(Q112(H) 469(G106(H)  BUN 7 - 25 mg/dL 18 21 15   Creatinine 0.60 - 1.35 mg/dL 2.950.89 2.840.96 1.320.82  Sodium 135 - 146 mmol/L 138 137 139  Potassium 3.5 - 5.3 mmol/L 4.4 4.6 3.6  Chloride 98 - 110 mmol/L 102 103 105  CO2 20 - 32 mmol/L 27 24 23   Calcium 8.6 - 10.3 mg/dL 9.2 9.4 9.1  Total Protein 6.1 - 8.1 g/dL 7.1 7.5 6.6  Total Bilirubin 0.2 - 1.2 mg/dL 1.1 1.2 0.8  Alkaline Phos 40 - 115 U/L -  62 60  AST 10 - 40 U/L 23 32 18  ALT 9 - 46 U/L 33 35 22    Imaging studies:  Limited RUQ Abdominal Ultrasound (03/18/2017) - personally reviewed and discussed with patient Multiple small polyps in the gallbladder neck, measuring up to 7 mm in diameter each. No gallbladder wall thickening or pericholecystic fluid.  No sonographic Murphy sign. Common bile duct diameter: 6.2 mm.  Assessment/Plan:  42 y.o. male with gallbladder polyps, possible symptomatic cholelithiasis, complicated by co-morbidities including DM (until recently poorly controlled with HbA1C 10.6 in 02/2017), HTN, hypercholesterolemia, chronic methotrexate for rheumatoid arthritis, and former tobacco abuse.   - minimize avoid fattier foods (meats, cheeses/dairy, fried foods)  - prefer low- to non-fat foods such as vegetables, whole grains, and fruit  - okay to continue aspirin 81 mg if necessary to reduce cardiovascular risk  - stop methotrexate x 10 days prior to surgery and 2 weeks following surgery  - continue improved blood glucose control and will need repeat HbA1C before surgery  - all risks, benefits, and alternatives to cholecystectomy were discussed with the patient, all of his  questions were answered to his expressed satisfaction, patient expresses he wishes to proceed, and informed consent was obtained.  - will plan for laparoscopic cholecystectomy on Friday, February 8th pending pre-admission testing  - return to clinic 2 weeks after surgery, instructed to call if any questions or concerns  All of the above recommendations were discussed with the patient, and all of patient's questions were answered to his expressed satisfaction.  Thank you for the opportunity to participate in this patient's care.  -- Scherrie Gerlach Earlene Plater, MD, RPVI [place]: [facility] - Partnering for exceptional care. Office: [phone]

## 2017-04-08 NOTE — Progress Notes (Signed)
Surgical Clinic History and Physical  Referring provider:  Kerman Passey, MD [address] [place], [place] [address]  HISTORY OF PRESENT ILLNESS (HPI):  42 y.o. male presents for evaluation of gallbladder polyps. Patient reports intermittent RUQ abdominal pain, though he says he's unsure whether his pain tends to follow any types of food in particular. RUQ ultrasound was ordered for evaluation of his gallbladder and demonstrated gallbladder polyps. Patient denies any N/V, fever/chills, diarrhea, constipation, CP, or SOB.  Until the past month, patient states he had been controlling his diabetes with dietary restraint alone, but his blood glucose levels over the past several months became increasingly elevated to 200's - 300's despite diet, and his HbA1C in 02/2017 was 10.6. Since, he was prescribed insulin (including BID long-acting insulin) with much improved control of his blood glucose, now reportedly typically ~120. Patient also describes he has purposefully lost weight recently by exercising more and quit smoking 1 ppd 2 years ago.  PAST MEDICAL HISTORY (PMH):  Past Medical History:  Diagnosis Date  . CKD (chronic kidney disease)   . Hyperlipidemia   . Hypertension   . Rheumatoid arthritis, 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HCC) 01/23/2016  . Trigeminal neuralgia 06/03/2015  . Type 2 diabetes mellitus, uncontrolled (HCC)      PAST SURGICAL HISTORY (PSH):  Past Surgical History:  Procedure Laterality Date  . APPENDECTOMY    . TONSILLECTOMY    . VASECTOMY  Jan 2015     MEDICATIONS:  Prior to Admission medications   Medication Sig Start Date End Date Taking? Authorizing Provider  aspirin EC 81 MG tablet Take 81 mg by mouth daily.   Yes [provider]  atorvastatin (LIPITOR) 40 MG tablet TAKE ONE-HALF (1/2) TABLET AT BEDTIME 03/01/17  Yes Lada, Janit Bern, MD  FLUCELVAX QUADRIVALENT 0.5 ML SUSY  02/05/17  Yes [provider]  folic acid (FOLVITE) 1 MG tablet Take 1 mg  by mouth daily. 06/06/16  Yes [provider]  insulin NPH Human (HUMULIN N) 100 UNIT/ML injection Inject 0.3 mLs (30 Units total) into the skin daily before breakfast. Patient taking differently: Inject 30 Units into the skin at bedtime.  03/15/17  Yes Lada, Janit Bern, MD  insulin regular (HUMULIN R) 100 units/mL injection Inject 0.3 mLs (30 Units total) into the skin 3 (three) times daily before meals. 03/15/17  Yes Lada, Janit Bern, MD  methotrexate (RHEUMATREX) 2.5 MG tablet Take 10 mg by mouth once a week.  06/17/16  Yes [provider]  naproxen sodium (ALEVE) 220 MG tablet Take 1 tablet (220 mg total) by mouth 2 (two) times daily as needed. 03/11/17  Yes Lada, Janit Bern, MD  VASCEPA 1 g CAPS TAKE 2 CAPSULES TWICE A DAY (THIS REPLACES FISH OIL) 09/13/16  Yes Lada, Janit Bern, MD     ALLERGIES:  No Known Allergies   SOCIAL HISTORY:  Social History   Socioeconomic History  . Marital status: Married    Spouse name: Not on file  . Number of children: Not on file  . Years of education: Not on file  . Highest education level: Not on file  Social Needs  . Financial resource strain: Not on file  . Food insecurity - worry: Not on file  . Food insecurity - inability: Not on file  . Transportation needs - medical: Not on file  . Transportation needs - non-medical: Not on file  Occupational History  . Not on file  Tobacco Use  . Smoking status: Former Smoker  Packs/day: 1.00    Years: 25.00    Pack years: 25.00    Types: Cigarettes    Last attempt to quit: 02/08/2015    Years since quitting: 2.1  . Smokeless tobacco: Never Used  Substance and Sexual Activity  . Alcohol use: Yes    Alcohol/week: 1.8 oz    Types: 2 Cans of beer, 1 Shots of liquor per week    Comment: 1-4 drinks/week  . Drug use: No  . Sexual activity: Yes  Other Topics Concern  . Not on file  Social History Narrative  . Not on file    The patient currently resides (home / rehab facility /  nursing home): Home The patient normally is (ambulatory / bedbound): Ambulatory  FAMILY HISTORY:  Family History  Problem Relation Age of Onset  . Diabetes Sister        type 1  . Dementia Maternal Grandmother   . AAA (abdominal aortic aneurysm) Maternal Grandfather   . Hypertension Maternal Grandfather     Otherwise negative/non-contributory.  REVIEW OF SYSTEMS:  Constitutional: denies any other weight loss, fever, chills, or sweats  Eyes: denies any other vision changes, history of eye injury  ENT: denies sore throat, hearing problems  Respiratory: denies shortness of breath, wheezing  Cardiovascular: denies chest pain, palpitations  Gastrointestinal: abdominal pain, N/V, and bowel function as per HPI Musculoskeletal: denies any other joint pains or cramps  Skin: Denies any other rashes or skin discolorations Neurological: denies any other headache, dizziness, weakness  Psychiatric: Denies any other depression, anxiety   All other review of systems were otherwise negative   VITAL SIGNS:  @VSRANGES @     Height: 6' 1.5" (186.7 cm) Weight: 263 lb (119.3 kg) BMI (Calculated): 34.22   PHYSICAL EXAM:  Constitutional:  -- Normal body habitus  -- Awake, alert, and oriented x3  Eyes:  -- Pupils equally round and reactive to light  -- No scleral icterus  Ear, nose, throat:  -- No jugular venous distension -- No nasal drainage, bleeding Pulmonary:  -- No crackles  -- Equal breath sounds bilaterally -- Breathing non-labored at rest Cardiovascular:  -- S1, S2 present  -- No pericardial rubs  Gastrointestinal:  -- Abdomen soft and non-distended with mild RUQ abdominal tenderness to palpation, no guarding/rebound  -- No abdominal masses appreciated, pulsatile or otherwise  Musculoskeletal and Integumentary:  -- Wounds or skin discoloration: None appreciated -- Extremities: B/L UE and LE FROM, hands and feet warm, no edema  Neurologic:  -- Motor function: Intact and  symmetric -- Sensation: Intact and symmetric  Labs:  CBC Latest Ref Rng & Units 01/09/2016 09/05/2015 04/30/2015  WBC 3.8 - 10.8 K/uL 5.7 8.6 7.2  Hemoglobin 13.2 - 17.1 g/dL 14.714.2 82.915.3 56.215.5  Hematocrit 38.5 - 50.0 % 41.7 45.3 44.6  Platelets 140 - 400 K/uL 290 286 282   CMP Latest Ref Rng & Units 03/11/2017 07/09/2016 01/09/2016  Glucose 65 - 99 mg/dL 130(Q168(H) 657(Q112(H) 469(G106(H)  BUN 7 - 25 mg/dL 18 21 15   Creatinine 0.60 - 1.35 mg/dL 2.950.89 2.840.96 1.320.82  Sodium 135 - 146 mmol/L 138 137 139  Potassium 3.5 - 5.3 mmol/L 4.4 4.6 3.6  Chloride 98 - 110 mmol/L 102 103 105  CO2 20 - 32 mmol/L 27 24 23   Calcium 8.6 - 10.3 mg/dL 9.2 9.4 9.1  Total Protein 6.1 - 8.1 g/dL 7.1 7.5 6.6  Total Bilirubin 0.2 - 1.2 mg/dL 1.1 1.2 0.8  Alkaline Phos 40 - 115 U/L -  62 60  AST 10 - 40 U/L 23 32 18  ALT 9 - 46 U/L 33 35 22    Imaging studies:  Limited RUQ Abdominal Ultrasound (03/18/2017) - personally reviewed and discussed with patient Multiple small polyps in the gallbladder neck, measuring up to 7 mm in diameter each. No gallbladder wall thickening or pericholecystic fluid.  No sonographic Murphy sign. Common bile duct diameter: 6.2 mm.  Assessment/Plan:  42 y.o. male with gallbladder polyps, possible symptomatic cholelithiasis, complicated by co-morbidities including DM (until recently poorly controlled with HbA1C 10.6 in 02/2017), HTN, hypercholesterolemia, chronic methotrexate for rheumatoid arthritis, and former tobacco abuse.   - minimize avoid fattier foods (meats, cheeses/dairy, fried foods)  - prefer low- to non-fat foods such as vegetables, whole grains, and fruit  - okay to continue aspirin 81 mg if necessary to reduce cardiovascular risk  - stop methotrexate x 10 days prior to surgery and 2 weeks following surgery  - continue improved blood glucose control and will need repeat HbA1C before surgery  - all risks, benefits, and alternatives to cholecystectomy were discussed with the patient, all of his  questions were answered to his expressed satisfaction, patient expresses he wishes to proceed, and informed consent was obtained.  - will plan for laparoscopic cholecystectomy on Friday, February 8th pending pre-admission testing  - return to clinic 2 weeks after surgery, instructed to call if any questions or concerns  All of the above recommendations were discussed with the patient, and all of patient's questions were answered to his expressed satisfaction.  Thank you for the opportunity to participate in this patient's care.  -- Scherrie Gerlach Earlene Plater, MD, RPVI [place]: [facility] - Partnering for exceptional care. Office: [phone]

## 2017-04-11 ENCOUNTER — Telehealth: Payer: Self-pay

## 2017-04-11 ENCOUNTER — Telehealth: Payer: Self-pay | Admitting: Surgery

## 2017-04-11 NOTE — Telephone Encounter (Signed)
Called patient to let him know that Dr. Earlene Plateravis wanted him to stop his Methotrexate tablet 10 days prior to his surgery and 1-2 weeks after surgery. Patient agreed and stated that he understood what he is to do and had no questions.

## 2017-04-11 NOTE — Telephone Encounter (Signed)
Pt advised of pre op date/time and sx date. Sx: 05/06/17 with Dr Davis--laparoscopic cholecystectomy.  Pre op: 04/29/17 @ 7:30am--office interview.   Patient made aware to call [phone], between 1-3:00pm the day before surgery, to find out what time to arrive.

## 2017-04-11 NOTE — Telephone Encounter (Signed)
-----   Message from Ancil LinseyJason Evan Davis, MD sent at 04/08/2017  9:52 PM EST ----- Regarding: Please hold patient's Methotrexate 10 days before and 2 weeks after his planned surgery Hi,  I just noticed this patient is taking Methotrexate for his rheumatoid arthritis. That did not come up during his visit today, and I did not until now notice it in his chart/meds. His Methotrexate should be held/stopped for 10 days prior to his surgery and for 1 - 2 weeks after surgery. Holding his Methotrexate peri-operatively should be well-tolerated. Please let me know if any questions.  Thank you!            - Barbara CowerJason

## 2017-04-22 ENCOUNTER — Encounter: Payer: Self-pay | Admitting: Dietician

## 2017-04-22 ENCOUNTER — Encounter: Payer: Managed Care, Other (non HMO) | Admitting: Dietician

## 2017-04-22 VITALS — BP 110/82 | Ht 73.5 in | Wt 261.9 lb

## 2017-04-22 DIAGNOSIS — E119 Type 2 diabetes mellitus without complications: Secondary | ICD-10-CM

## 2017-04-22 DIAGNOSIS — Z713 Dietary counseling and surveillance: Secondary | ICD-10-CM | POA: Diagnosis not present

## 2017-04-22 NOTE — Patient Instructions (Signed)
   Start exercise after recovery from surgery; start slowly, short duration and increase as able.  When starting exercise, gradually increase carbohydrate intake by adding at least one serving (15g) to each meal and/or snack. Choose beans, fruits, and whole grains as the healthiest carbs.   Eventually work towards eating at least 9 servings of carb foods each day. Typical goal for men is 12-15 servings daily or 180-225grams.

## 2017-04-22 NOTE — Progress Notes (Signed)
Diabetes Self-Management Education  Visit Type:  Follow-up  Appt. Start Time: 0900 Appt. End Time: 0950  04/22/2017  [name], identified by name and date of birth, is a 42 y.o. male with a diagnosis of Diabetes:  .   ASSESSMENT  Blood pressure 110/82, height 6' 1.5" (1.867 m), weight 261 lb 14.4 oz (118.8 kg). Body mass index is 34.09 kg/m.   Diabetes Self-Management Education - 04/22/17 0907      Complications   How often do you check your blood sugar?  3-4 times/day    Fasting Blood glucose range (mg/dL)  16-109;604-54070-129;130-179    Postprandial Blood glucose range (mg/dL)  98-11970-129    Number of hypoglycemic episodes per month  1    Can you tell when your blood sugar is low?  Yes    What do you do if your blood sugar is low?  ate lunch; BG of 68 just before lunch time    Have you had a dilated eye exam in the past 12 months?  Yes    Have you had a dental exam in the past 12 months?  Yes    Are you checking your feet?  Yes    How many days per week are you checking your feet?  7      Dietary Intake   Breakfast  3 meals and 0-1 snack daily      Exercise   Exercise Type  ADL's plans to start exercise after galbladder surgery next month      Patient Education   Nutrition management   Role of diet in the treatment of diabetes and the relationship between the three main macronutrients and blood glucose level;Food label reading, portion sizes and measuring food.;Meal options for control of blood glucose level and chronic complications.;Other (comment) basic CHO needs+ healthy choices, plan for gradually adding     Physical activity and exercise   Role of exercise on diabetes management, blood pressure control and cardiac health.;Identified with patient nutritional and/or medication changes necessary with exercise.    Monitoring  Taught/discussed recording of test results and interpretation of SMBG.    Acute complications  Taught treatment of hypoglycemia - the 15 rule.      Post-Education Assessment   Patient understands the diabetes disease and treatment process.  Demonstrates understanding / competency    Patient understands incorporating nutritional management into lifestyle.  Demonstrates understanding / competency    Patient undertands incorporating physical activity into lifestyle.  Demonstrates understanding / competency    Patient understands using medications safely.  Demonstrates understanding / competency    Patient understands monitoring blood glucose, interpreting and using results  Demonstrates understanding / competency    Patient understands prevention, detection, and treatment of acute complications.  Demonstrates understanding / competency    Patient understands prevention, detection, and treatment of chronic complications.  Demonstrates understanding / competency    Patient understands how to develop strategies to address psychosocial issues.  Demonstrates understanding / competency    Patient understands how to develop strategies to promote health/change behavior.  Demonstrates understanding / competency      Outcomes   Program Status  Completed       Learning Objective:  Patient will have a greater understanding of diabetes self-management. Patient education plan is to attend individual and/or group sessions per assessed needs and concerns.  [name] has been strictly limiting carbohydrates to 30g daily in effort to improve BGs and lose weight, and he is achieving these goals. Instructed  him on importance of nutritionally balanced intake and potential for nutrient deficiencies when eliminating food groups. Discussed strategy for gradually adding healthy carbohydrate foods back into his diet, particularly when he resumes regular exercise after recovering from upcoming surgery.    Plan:   Patient Instructions   Start exercise after recovery from surgery; start slowly, short duration and increase as able.  When starting exercise, gradually  increase carbohydrate intake by adding at least one serving (15g) to each meal and/or snack. Choose beans, fruits, and whole grains as the healthiest carbs.   Eventually work towards eating at least 9 servings of carb foods each day. Typical goal for men is 12-15 servings daily or 180-225grams.     Expected Outcomes:  Demonstrated interest in learning. Expect positive outcomes  Education material provided: Planning A Balanced Meal; Goals and Instructions  If problems or questions, patient to contact team via:  Phone and Email  Future DSME appointment: -  none; program completed.

## 2017-04-27 ENCOUNTER — Ambulatory Visit (INDEPENDENT_AMBULATORY_CARE_PROVIDER_SITE_OTHER): Payer: Managed Care, Other (non HMO) | Admitting: Family Medicine

## 2017-04-27 ENCOUNTER — Encounter: Payer: Self-pay | Admitting: Family Medicine

## 2017-04-27 DIAGNOSIS — E1165 Type 2 diabetes mellitus with hyperglycemia: Secondary | ICD-10-CM | POA: Diagnosis not present

## 2017-04-27 DIAGNOSIS — N529 Male erectile dysfunction, unspecified: Secondary | ICD-10-CM | POA: Diagnosis not present

## 2017-04-27 DIAGNOSIS — M059 Rheumatoid arthritis with rheumatoid factor, unspecified: Secondary | ICD-10-CM

## 2017-04-27 MED ORDER — SILDENAFIL CITRATE 100 MG PO TABS
25.0000 mg | ORAL_TABLET | Freq: Every day | ORAL | 11 refills | Status: DC | PRN
Start: 2017-04-27 — End: 2018-06-12

## 2017-04-27 NOTE — Assessment & Plan Note (Signed)
He will check with rheumatologist to see if MTX might be contributing to the ED

## 2017-04-27 NOTE — Progress Notes (Signed)
BP 138/86   Pulse 96   Temp 98.4 F (36.9 C) (Oral)   Resp 14   Wt 258 lb (117 kg)   SpO2 94%   BMI 33.58 kg/m    Subjective:    Patient ID: Nathan Rojas, male    DOB: February 09, 1976, 42 y.o.   MRN: [id number]  HPI: Nathan Rojas is a 42 y.o. male  Chief Complaint  Patient presents with  . Erectile Dysfunction  . Medication Refill   HPI He has been having problems with erections 6-7 months He can get the erection; hard enough for penetration, after a few minutes, loses the erection every time Does awakening with erections occasionally No blood in the urine; no blood in the ejaculate, none in the last while No lumps or pain in the testicules No burning with urination No slowed stream No problems with libido He has not tried any products for erections No  Type 2 diabetes; he has lost 12 pounds, sugars 85-160; not even having to take any R insulin; taking the long-lasting insulin BID; for the last 3 days, no night-time insulin; FSBS this morning 130; watching what he eats; less than 70 grams of carbs a day; met with nutritionist; wife eating better and she has dropped 12 pounds; he is changing his lifestyle We reviewed his medicines; on MTX for 2 years No issues with partner Having gallbladder removed next week 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 CKD (chronic kidney disease)   . Hyperlipidemia   . Hypertension   . Rheumatoid arthritis, 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[place]) 01/23/2016  . Trigeminal neuralgia 06/03/2015  . Type 2 diabetes mellitus, uncontrolled ([place])    Past Surgical History:  Procedure Laterality Date  .  APPENDECTOMY    . TONSILLECTOMY    . VASECTOMY  Jan 2015   Family History  Problem Relation Age of Onset  . Diabetes Sister        type 1  . Dementia Maternal Grandmother   . AAA (abdominal aortic aneurysm) Maternal Grandfather   . Hypertension Maternal Grandfather    Social History   Tobacco Use  . Smoking status: Former Smoker    Packs/day: 1.00    Years: 25.00    Pack years: 25.00    Types: Cigarettes    Last attempt to quit: 02/08/2015    Years since quitting: 2.2  . Smokeless tobacco: Never Used  Substance Use Topics  . Alcohol use: Yes    Alcohol/week: 1.8 oz    Types: 2 Cans of beer, 1 Shots of liquor per week    Comment: 1-4 drinks/week  . Drug use: No    Interim medical history since last visit reviewed. Allergies and medications reviewed  Review of Systems  Constitutional: Negative for unexpected weight change (he is working hard for his weight loss).  Cardiovascular: Negative for chest pain and leg swelling.   Per HPI unless specifically indicated above     Objective:    BP 138/86   Pulse 96   Temp 98.4 F (36.9 C) (Oral)   Resp 14  Wt 258 lb (117 kg)   SpO2 94%   BMI 33.58 kg/m   Wt Readings from Last 3 Encounters:  04/27/17 258 lb (117 kg)  04/22/17 261 lb 14.4 oz (118.8 kg)  04/08/17 263 lb (119.3 kg)    Physical Exam  Constitutional: He appears well-developed and well-nourished. No distress.  Eyes: No scleral icterus.  Cardiovascular: Normal rate and regular rhythm.  Pulmonary/Chest: Effort normal and breath sounds normal.  Neurological: He is alert.  Skin: No pallor.  Psychiatric: He has a normal mood and affect.   Diabetic Foot Form - Detailed   Diabetic Foot Exam - detailed Diabetic Foot exam was performed with the following findings:  Yes 04/27/2017 11:17 AM  Visual Foot Exam completed.:  Yes  Pulse Foot Exam completed.:  Yes  Right Dorsalis Pedis:  Present Left Dorsalis Pedis:  Present  Sensory Foot Exam Completed.:   Yes Semmes-Weinstein Monofilament Test R Site 1-Great Toe:  Pos L Site 1-Great Toe:  Pos          Assessment & Plan:   Problem List Items Addressed This Visit      Endocrine   Type 2 diabetes mellitus, uncontrolled ([place]) (Chronic)    So proud of him for working so hard on his diet and weight loss; he is so motivated to get off insulin in the future by losing weight; foot exam by MD today; diabetes is a vascular disease and contributes to ED; glad to hear he is seriously working on controlling his sugars        Musculoskeletal and Integument   Rheumatoid arthritis, 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[place])    He will check with rheumatologist to see if MTX might be contributing to the ED        Genitourinary   Erectile dysfunction    Discussed sx, treatment options; will use PDE-5 inhibitor; discussed never take NTG after taking meds, can cause unsafe drop in BP; let me know if not working, then we can try something else          Follow up plan: No Follow-up on file.  An after-visit summary was printed and given to the patient at [facility].  Please see the patient instructions which may contain other information and recommendations beyond what is mentioned above in the assessment and plan.  Meds ordered this encounter  Medications  . sildenafil (VIAGRA) 100 MG tablet    Sig: Take 0.5 tablets (50 mg total) by mouth daily as needed for erectile dysfunction.    Dispense:  5 tablet    Refill:  11    No orders of the defined types were placed in this encounter.

## 2017-04-27 NOTE — Patient Instructions (Addendum)
Contact your rheumatologist to see if the methotrexate can contribute to the issue Try the new medicine Never ever take a nitrate like nitroglycerin after taking the erectile dysfunction; if you have a pill for erectile dysfunction and then have chest pain, always let the EMTs and doctors know you took a pill Keep up the great job with watching your diet and carb intake, as well as your weight loss Let me know if the symptoms continue

## 2017-04-27 NOTE — Assessment & Plan Note (Signed)
Discussed sx, treatment options; will use PDE-5 inhibitor; discussed never take NTG after taking meds, can cause unsafe drop in BP; let me know if not working, then we can try something else

## 2017-04-27 NOTE — Assessment & Plan Note (Addendum)
So proud of him for working so hard on his diet and weight loss; he is so motivated to get off insulin in the future by losing weight; foot exam by MD today; diabetes is a vascular disease and contributes to ED; glad to hear he is seriously working on controlling his sugars

## 2017-04-29 ENCOUNTER — Encounter
Admission: RE | Admit: 2017-04-29 | Discharge: 2017-04-29 | Disposition: A | Discharge status: Home / Self Care | Payer: Managed Care, Other (non HMO) | Source: Ambulatory Visit | Attending: Surgery | Admitting: Surgery

## 2017-04-29 ENCOUNTER — Other Ambulatory Visit: Payer: Self-pay

## 2017-04-29 DIAGNOSIS — Z01812 Encounter for preprocedural laboratory examination: Secondary | ICD-10-CM | POA: Insufficient documentation

## 2017-04-29 DIAGNOSIS — R001 Bradycardia, unspecified: Secondary | ICD-10-CM | POA: Insufficient documentation

## 2017-04-29 DIAGNOSIS — E119 Type 2 diabetes mellitus without complications: Secondary | ICD-10-CM | POA: Insufficient documentation

## 2017-04-29 DIAGNOSIS — Z01810 Encounter for preprocedural cardiovascular examination: Secondary | ICD-10-CM | POA: Insufficient documentation

## 2017-04-29 DIAGNOSIS — I10 Essential (primary) hypertension: Secondary | ICD-10-CM | POA: Insufficient documentation

## 2017-04-29 HISTORY — DX: Methicillin resistant Staphylococcus aureus infection, unspecified site: A49.02

## 2017-04-29 LAB — CBC WITH DIFFERENTIAL/PLATELET
Basophils Absolute: 0 10*3/uL (ref 0–0.1)
Basophils Relative: 1 %
Eosinophils Absolute: 0.1 10*3/uL (ref 0–0.7)
Eosinophils Relative: 2 %
HCT: 44.3 % (ref 40.0–52.0)
Hemoglobin: 14.7 g/dL (ref 13.0–18.0)
Lymphocytes Relative: 26 %
Lymphs Abs: 1.4 10*3/uL (ref 1.0–3.6)
MCH: 29.2 pg (ref 26.0–34.0)
MCHC: 33.1 g/dL (ref 32.0–36.0)
MCV: 88.1 fL (ref 80.0–100.0)
Monocytes Absolute: 0.3 10*3/uL (ref 0.2–1.0)
Monocytes Relative: 6 %
Neutro Abs: 3.5 10*3/uL (ref 1.4–6.5)
Neutrophils Relative %: 65 %
Platelets: 259 10*3/uL (ref 150–440)
RBC: 5.03 MIL/uL (ref 4.40–5.90)
RDW: 14.8 % — ABNORMAL HIGH (ref 11.5–14.5)
WBC: 5.4 10*3/uL (ref 3.8–10.6)

## 2017-04-29 LAB — COMPREHENSIVE METABOLIC PANEL
ALT: 33 U/L (ref 17–63)
AST: 25 U/L (ref 15–41)
Albumin: 4.5 g/dL (ref 3.5–5.0)
Alkaline Phosphatase: 59 U/L (ref 38–126)
Anion gap: 9 (ref 5–15)
BUN: 22 mg/dL — ABNORMAL HIGH (ref 6–20)
CO2: 22 mmol/L (ref 22–32)
Calcium: 9.2 mg/dL (ref 8.9–10.3)
Chloride: 105 mmol/L (ref 101–111)
Creatinine, Ser: 0.8 mg/dL (ref 0.61–1.24)
GFR calc Af Amer: >60 mL/min (ref >60)
GFR calc non Af Amer: >60 mL/min (ref >60)
Glucose, Bld: 197 mg/dL — ABNORMAL HIGH (ref 65–99)
Potassium: 4.3 mmol/L (ref 3.5–5.1)
Sodium: 136 mmol/L (ref 135–145)
Total Bilirubin: 0.8 mg/dL (ref 0.3–1.2)
Total Protein: 7.5 g/dL (ref 6.5–8.1)

## 2017-04-29 LAB — PROTIME-INR
INR: 0.86
Prothrombin Time: 11.6 seconds (ref 11.4–15.2)

## 2017-04-29 LAB — HEMOGLOBIN A1C
Hgb A1c MFr Bld: 8.7 % — ABNORMAL HIGH (ref 4.8–5.6)
Mean Plasma Glucose: 202.99 mg/dL

## 2017-04-29 LAB — SURGICAL PCR SCREEN
MRSA, PCR: NEGATIVE
Staphylococcus aureus: POSITIVE — AB

## 2017-04-29 NOTE — Patient Instructions (Signed)
Your procedure is scheduled on: Friday, May 06, 2017 Report to Same Day Surgery on the 2nd floor in the [facility]. To find out your arrival time, please call [phone] between 1PM - 3PM on: Thursday, May 05, 2017  REMEMBER: Instructions that are not followed completely may result in serious medical risk, up to and including death; or upon the discretion of your surgeon and anesthesiologist your surgery may need to be rescheduled.  Do not eat food after midnight the night before your procedure.  No gum chewing or hard candies.  You may however, drink water up to 2 hours before you are scheduled to arrive at the hospital for your procedure.  Do not drink water within 2 hours of the start of your surgery.  No Alcohol for 24 hours before or after surgery.  No Smoking including e-cigarettes for 24 hours prior to surgery. No chewable tobacco products for at least 6 hours prior to surgery. No nicotine patches on the day of surgery.  On the morning of surgery brush your teeth with toothpaste and water, you may rinse your mouth with mouthwash if you wish. Do not swallow any  toothpaste of mouthwash.  Notify your doctor if there is any change in your medical condition (cold, fever, infection).  Do not wear jewelry, make-up, hairpins, clips or nail polish.  Do not wear lotions, powders, or perfumes. You may wear deodorant.  Do not shave 48 hours prior to surgery. Men may shave face and neck.  Contacts and dentures may not be worn into surgery.  Do not bring valuables to the hospital. [facility] any belongings or valuables.  TAKE THESE MEDICATIONS THE MORNING OF SURGERY:  NONE  Use CHG Soap as directed on instruction sheet.  Take 1/2 of usual insulin dose the night before surgery and none on the morning of surgery. (12 UNITS OF HUMULIN N THE NIGHT BEFORE SURGERY)  Follow recommendations from PCP regarding stopping Aspirin.  NOW!  Stop ASPIRIN AND  Anti-inflammatories such as METHOTREXATE, Advil, Aleve, Ibuprofen, Motrin, Naproxen, Naprosyn, Goodie powder, or aspirin products. (May take Tylenol or Acetaminophen if needed.)  NOW!  Stop ANY OVER THE COUNTER supplements until after surgery (FISH OIL). (May continue Vitamin D and multivitamin.)  If you are being discharged the day of surgery, you will not be allowed to drive home. You will need someone to drive you home and stay with you that night.   If you are taking public transportation, you will need to have a responsible adult to with you.  Please call the number above if you have any questions about these instructions.

## 2017-04-29 NOTE — Pre-Procedure Instructions (Signed)
EKG performed on 04/29/2017. Patient's medical history and EKG results were reviewed by Dr. Pernell DupreAdams (anesthesia). Ok to proceed to surgery at this time.

## 2017-05-05 MED ORDER — CEFAZOLIN SODIUM-DEXTROSE 2-4 GM/100ML-% IV SOLN
2.0000 g | INTRAVENOUS | Status: AC
  Administered 2017-05-06: 2 g via INTRAVENOUS

## 2017-05-06 ENCOUNTER — Ambulatory Visit: Payer: Managed Care, Other (non HMO) | Admitting: Anesthesiology

## 2017-05-06 ENCOUNTER — Other Ambulatory Visit: Payer: Self-pay

## 2017-05-06 ENCOUNTER — Ambulatory Visit
Admission: RE | Admit: 2017-05-06 | Discharge: 2017-05-06 | Disposition: A | Discharge status: Home / Self Care | Payer: Managed Care, Other (non HMO) | Source: Ambulatory Visit | Attending: Surgery | Admitting: Surgery

## 2017-05-06 ENCOUNTER — Encounter
Admission: RE | Disposition: A | Discharge status: Home / Self Care | Payer: Self-pay | Source: Ambulatory Visit | Attending: Surgery

## 2017-05-06 DIAGNOSIS — Z79899 Other long term (current) drug therapy: Secondary | ICD-10-CM | POA: Diagnosis not present

## 2017-05-06 DIAGNOSIS — I129 Hypertensive chronic kidney disease with stage 1 through stage 4 chronic kidney disease, or unspecified chronic kidney disease: Secondary | ICD-10-CM | POA: Diagnosis not present

## 2017-05-06 DIAGNOSIS — E7800 Pure hypercholesterolemia, unspecified: Secondary | ICD-10-CM | POA: Insufficient documentation

## 2017-05-06 DIAGNOSIS — G500 Trigeminal neuralgia: Secondary | ICD-10-CM | POA: Diagnosis not present

## 2017-05-06 DIAGNOSIS — K8020 Calculus of gallbladder without cholecystitis without obstruction: Secondary | ICD-10-CM

## 2017-05-06 DIAGNOSIS — Z7982 Long term (current) use of aspirin: Secondary | ICD-10-CM | POA: Diagnosis not present

## 2017-05-06 DIAGNOSIS — N189 Chronic kidney disease, unspecified: Secondary | ICD-10-CM | POA: Diagnosis not present

## 2017-05-06 DIAGNOSIS — E1122 Type 2 diabetes mellitus with diabetic chronic kidney disease: Secondary | ICD-10-CM | POA: Insufficient documentation

## 2017-05-06 DIAGNOSIS — Z8614 Personal history of Methicillin resistant Staphylococcus aureus infection: Secondary | ICD-10-CM | POA: Insufficient documentation

## 2017-05-06 DIAGNOSIS — K811 Chronic cholecystitis: Secondary | ICD-10-CM | POA: Insufficient documentation

## 2017-05-06 DIAGNOSIS — Z87891 Personal history of nicotine dependence: Secondary | ICD-10-CM | POA: Insufficient documentation

## 2017-05-06 DIAGNOSIS — Z794 Long term (current) use of insulin: Secondary | ICD-10-CM | POA: Diagnosis not present

## 2017-05-06 DIAGNOSIS — M059 Rheumatoid arthritis with rheumatoid factor, unspecified: Secondary | ICD-10-CM | POA: Insufficient documentation

## 2017-05-06 HISTORY — PX: CHOLECYSTECTOMY: SHX55

## 2017-05-06 LAB — GLUCOSE, CAPILLARY
Glucose-Capillary: 168 mg/dL — ABNORMAL HIGH (ref 65–99)
Glucose-Capillary: 164 mg/dL — ABNORMAL HIGH (ref 65–99)

## 2017-05-06 SURGERY — LAPAROSCOPIC CHOLECYSTECTOMY
Anesthesia: General | Wound class: Clean Contaminated

## 2017-05-06 MED ORDER — PHENYLEPHRINE HCL 10 MG/ML IJ SOLN
INTRAMUSCULAR | Status: AC
  Filled 2017-05-06: qty 1

## 2017-05-06 MED ORDER — OXYCODONE HCL 5 MG PO TABS: 5.0000 mg | ORAL_TABLET | Freq: Once | ORAL | Status: DC | PRN

## 2017-05-06 MED ORDER — OXYCODONE-ACETAMINOPHEN 5-325 MG PO TABS: 1.0000 | ORAL_TABLET | ORAL | 0 refills | Status: DC | PRN

## 2017-05-06 MED ORDER — FENTANYL CITRATE (PF) 100 MCG/2ML IJ SOLN
INTRAMUSCULAR | Status: DC | PRN
  Administered 2017-05-06 (×2): 50 ug via INTRAVENOUS
  Administered 2017-05-06: 100 ug via INTRAVENOUS

## 2017-05-06 MED ORDER — SODIUM CHLORIDE 0.9 % IV SOLN
INTRAVENOUS | Status: DC
  Administered 2017-05-06: 08:00:00 via INTRAVENOUS

## 2017-05-06 MED ORDER — LIDOCAINE HCL (PF) 2 % IJ SOLN
INTRAMUSCULAR | Status: AC
  Filled 2017-05-06: qty 10

## 2017-05-06 MED ORDER — SUCCINYLCHOLINE CHLORIDE 20 MG/ML IJ SOLN
INTRAMUSCULAR | Status: DC | PRN
  Administered 2017-05-06: 120 mg via INTRAVENOUS

## 2017-05-06 MED ORDER — BUPIVACAINE HCL (PF) 0.5 % IJ SOLN
INTRAMUSCULAR | Status: AC
  Filled 2017-05-06: qty 30

## 2017-05-06 MED ORDER — ROCURONIUM BROMIDE 50 MG/5ML IV SOLN
INTRAVENOUS | Status: AC
  Filled 2017-05-06: qty 1

## 2017-05-06 MED ORDER — DEXAMETHASONE SODIUM PHOSPHATE 10 MG/ML IJ SOLN
INTRAMUSCULAR | Status: AC
  Filled 2017-05-06: qty 1

## 2017-05-06 MED ORDER — ONDANSETRON HCL 4 MG/2ML IJ SOLN
INTRAMUSCULAR | Status: AC
  Filled 2017-05-06: qty 2

## 2017-05-06 MED ORDER — FAMOTIDINE 20 MG PO TABS
20.0000 mg | ORAL_TABLET | Freq: Once | ORAL | Status: AC
  Administered 2017-05-06: 20 mg via ORAL

## 2017-05-06 MED ORDER — PROPOFOL 10 MG/ML IV BOLUS
INTRAVENOUS | Status: AC
  Filled 2017-05-06: qty 20

## 2017-05-06 MED ORDER — DEXAMETHASONE SODIUM PHOSPHATE 10 MG/ML IJ SOLN
INTRAMUSCULAR | Status: DC | PRN
  Administered 2017-05-06: 10 mg via INTRAVENOUS

## 2017-05-06 MED ORDER — KETOROLAC TROMETHAMINE 30 MG/ML IJ SOLN
INTRAMUSCULAR | Status: DC | PRN
  Administered 2017-05-06: 30 mg via INTRAVENOUS

## 2017-05-06 MED ORDER — ONDANSETRON HCL 4 MG/2ML IJ SOLN
INTRAMUSCULAR | Status: DC | PRN
  Administered 2017-05-06: 4 mg via INTRAVENOUS

## 2017-05-06 MED ORDER — CEFAZOLIN SODIUM-DEXTROSE 2-4 GM/100ML-% IV SOLN
INTRAVENOUS | Status: AC
  Filled 2017-05-06: qty 100

## 2017-05-06 MED ORDER — MIDAZOLAM HCL 2 MG/2ML IJ SOLN
INTRAMUSCULAR | Status: AC
  Filled 2017-05-06: qty 2

## 2017-05-06 MED ORDER — KETOROLAC TROMETHAMINE 30 MG/ML IJ SOLN
INTRAMUSCULAR | Status: AC
  Filled 2017-05-06: qty 1

## 2017-05-06 MED ORDER — CHLORHEXIDINE GLUCONATE CLOTH 2 % EX PADS: 6.0000 | MEDICATED_PAD | Freq: Once | CUTANEOUS | Status: DC

## 2017-05-06 MED ORDER — SUGAMMADEX SODIUM 500 MG/5ML IV SOLN
INTRAVENOUS | Status: AC
  Filled 2017-05-06: qty 5

## 2017-05-06 MED ORDER — LIDOCAINE HCL (CARDIAC) 20 MG/ML IV SOLN
INTRAVENOUS | Status: DC | PRN
  Administered 2017-05-06: 100 mg via INTRAVENOUS

## 2017-05-06 MED ORDER — OXYCODONE HCL 5 MG/5ML PO SOLN: 5.0000 mg | Freq: Once | ORAL | Status: DC | PRN

## 2017-05-06 MED ORDER — FENTANYL CITRATE (PF) 100 MCG/2ML IJ SOLN: 25.0000 ug | INTRAMUSCULAR | Status: DC | PRN

## 2017-05-06 MED ORDER — SUGAMMADEX SODIUM 200 MG/2ML IV SOLN
INTRAVENOUS | Status: DC | PRN
  Administered 2017-05-06: 234 mg via INTRAVENOUS

## 2017-05-06 MED ORDER — FAMOTIDINE 20 MG PO TABS
ORAL_TABLET | ORAL | Status: AC
Start: 2017-05-06 — End: 2017-05-06
  Administered 2017-05-06: 20 mg via ORAL
  Filled 2017-05-06: qty 1

## 2017-05-06 MED ORDER — SUCCINYLCHOLINE CHLORIDE 20 MG/ML IJ SOLN
INTRAMUSCULAR | Status: AC
  Filled 2017-05-06: qty 1

## 2017-05-06 MED ORDER — ROCURONIUM BROMIDE 100 MG/10ML IV SOLN
INTRAVENOUS | Status: DC | PRN
  Administered 2017-05-06: 10 mg via INTRAVENOUS
  Administered 2017-05-06: 40 mg via INTRAVENOUS
  Administered 2017-05-06 (×2): 10 mg via INTRAVENOUS

## 2017-05-06 MED ORDER — ACETAMINOPHEN 10 MG/ML IV SOLN
INTRAVENOUS | Status: DC | PRN
  Administered 2017-05-06: 1000 mg via INTRAVENOUS

## 2017-05-06 MED ORDER — FENTANYL CITRATE (PF) 100 MCG/2ML IJ SOLN
INTRAMUSCULAR | Status: AC
  Filled 2017-05-06: qty 4

## 2017-05-06 MED ORDER — MIDAZOLAM HCL 2 MG/2ML IJ SOLN
INTRAMUSCULAR | Status: DC | PRN
  Administered 2017-05-06: 2 mg via INTRAVENOUS

## 2017-05-06 MED ORDER — LACTATED RINGERS IV SOLN
INTRAVENOUS | Status: DC | PRN
  Administered 2017-05-06: 09:00:00 via INTRAVENOUS

## 2017-05-06 MED ORDER — PROPOFOL 10 MG/ML IV BOLUS
INTRAVENOUS | Status: DC | PRN
  Administered 2017-05-06: 200 mg via INTRAVENOUS

## 2017-05-06 MED ORDER — LIDOCAINE HCL 1 % IJ SOLN
INTRAMUSCULAR | Status: DC | PRN
  Administered 2017-05-06: 20 mL

## 2017-05-06 MED ORDER — LIDOCAINE HCL (PF) 1 % IJ SOLN
INTRAMUSCULAR | Status: AC
  Filled 2017-05-06: qty 30

## 2017-05-06 SURGICAL SUPPLY — 41 items
ADH SKN CLS APL DERMABOND .7 (GAUZE/BANDAGES/DRESSINGS) ×1
APPLIER CLIP ROT 10 11.4 M/L (STAPLE) ×2
APR CLP MED LRG 11.4X10 (STAPLE) ×1
BAG SPEC RTRVL LRG 6X4 10 (ENDOMECHANICALS) ×1
CHLORAPREP W/TINT 26ML (MISCELLANEOUS) ×2 IMPLANT
CLIP APPLIE ROT 10 11.4 M/L (STAPLE) ×1 IMPLANT
DECANTER SPIKE VIAL GLASS SM (MISCELLANEOUS) ×4 IMPLANT
DERMABOND ADVANCED (GAUZE/BANDAGES/DRESSINGS) ×1
DERMABOND ADVANCED .7 DNX12 (GAUZE/BANDAGES/DRESSINGS) ×1 IMPLANT
DEVICE PMI PUNCTURE CLOSURE (MISCELLANEOUS) ×2 IMPLANT
DRESSING SURGICEL FIBRLLR 1X2 (HEMOSTASIS) IMPLANT
DRSG SURGICEL FIBRILLAR 1X2 (HEMOSTASIS)
ELECT REM PT RETURN 9FT ADLT (ELECTROSURGICAL) ×2
ELECTRODE REM PT RTRN 9FT ADLT (ELECTROSURGICAL) ×1 IMPLANT
GLOVE BIO SURGEON STRL SZ7 (GLOVE) ×4 IMPLANT
GLOVE BIOGEL PI IND STRL 7.5 (GLOVE) ×1 IMPLANT
GLOVE BIOGEL PI INDICATOR 7.5 (GLOVE) ×3
GOWN STRL REUS W/ TWL LRG LVL3 (GOWN DISPOSABLE) ×3 IMPLANT
GOWN STRL REUS W/TWL LRG LVL3 (GOWN DISPOSABLE) ×6
IRRIGATION STRYKERFLOW (MISCELLANEOUS) IMPLANT
IRRIGATOR STRYKERFLOW (MISCELLANEOUS)
IV NS 1000ML (IV SOLUTION) ×2
IV NS 1000ML BAXH (IV SOLUTION) ×1 IMPLANT
KIT TURNOVER KIT A (KITS) ×2 IMPLANT
L-HOOK LAP DISP 36CM (ELECTROSURGICAL) ×2
LHOOK LAP DISP 36CM (ELECTROSURGICAL) ×1 IMPLANT
NDL INSUFFLATION 14GA 120MM (NEEDLE) ×1 IMPLANT
NEEDLE HYPO 22GX1.5 SAFETY (NEEDLE) ×2 IMPLANT
NEEDLE INSUFFLATION 14GA 120MM (NEEDLE) ×2 IMPLANT
NS IRRIG 1000ML POUR BTL (IV SOLUTION) ×2 IMPLANT
PACK LAP CHOLECYSTECTOMY (MISCELLANEOUS) ×2 IMPLANT
PENCIL ELECTRO HAND CTR (MISCELLANEOUS) ×2 IMPLANT
POUCH SPECIMEN RETRIEVAL 10MM (ENDOMECHANICALS) ×2 IMPLANT
SCISSORS METZENBAUM CVD 33 (INSTRUMENTS) IMPLANT
SLEEVE ENDOPATH XCEL 5M (ENDOMECHANICALS) ×4 IMPLANT
SUT MNCRL AB 4-0 PS2 18 (SUTURE) ×2 IMPLANT
SUT VICRYL 0 UR6 27IN ABS (SUTURE) ×2 IMPLANT
SUT VICRYL AB 3-0 FS1 BRD 27IN (SUTURE) ×2 IMPLANT
TROCAR XCEL NON-BLD 11X100MML (ENDOMECHANICALS) ×2 IMPLANT
TROCAR XCEL NON-BLD 5MMX100MML (ENDOMECHANICALS) ×2 IMPLANT
TUBING INSUFFLATION (TUBING) ×2 IMPLANT

## 2017-05-06 NOTE — Anesthesia Postprocedure Evaluation (Signed)
Anesthesia Post Note  Patient: Nathan Rojas  Procedure(s) Performed: LAPAROSCOPIC CHOLECYSTECTOMY (N/A )  Patient location during evaluation: PACU Anesthesia Type: General Level of consciousness: awake and alert Pain management: pain level controlled Vital Signs Assessment: post-procedure vital signs reviewed and stable Respiratory status: spontaneous breathing, nonlabored ventilation, respiratory function stable and patient connected to nasal cannula oxygen Cardiovascular status: blood pressure returned to baseline and stable Postop Assessment: no apparent nausea or vomiting Anesthetic complications: no     Last Vitals:  Vitals:   05/06/17 1124 05/06/17 1152  BP: (!) 153/106 (!) 152/103  Pulse: 65 64  Resp: 17 17  Temp: 36.4 C   SpO2: 98% 97%    Last Pain:  Vitals:   05/06/17 1152  TempSrc: Temporal  PainSc: 2                  Cleda MccreedyJoseph K Alishah Schulte

## 2017-05-06 NOTE — Transfer of Care (Signed)
Immediate Anesthesia Transfer of Care Note  Patient: Nathan Rojas  Procedure(s) Performed: LAPAROSCOPIC CHOLECYSTECTOMY (N/A )  Patient Location: PACU  Anesthesia Type:General  Level of Consciousness: awake, alert  and oriented  Airway & Oxygen Therapy: Patient Spontanous Breathing and Patient connected to nasal cannula oxygen  Post-op Assessment: Report given to RN and Post -op Vital signs reviewed and stable  Post vital signs: Reviewed and stable  Last Vitals:  Vitals:   05/06/17 0804  BP: 130/84  Pulse: 64  Resp: 16  Temp: (!) 36.1 C  SpO2: 98%    Last Pain:  Vitals:   05/06/17 0804  TempSrc: Temporal         Complications: No apparent anesthesia complications

## 2017-05-06 NOTE — Anesthesia Procedure Notes (Signed)
Procedure Name: Intubation Performed by: Nelda Marseille, CRNA Pre-anesthesia Checklist: Patient identified, Patient being monitored, Timeout performed, Emergency Drugs available and Suction available Patient Re-evaluated:Patient Re-evaluated prior to induction Oxygen Delivery Method: Circle system utilized Preoxygenation: Pre-oxygenation with 100% oxygen Induction Type: IV induction Ventilation: Mask ventilation without difficulty Laryngoscope Size: Mac and 3 Grade View: Grade III Tube type: Oral Tube size: 7.5 mm Number of attempts: 1 Airway Equipment and Method: Stylet Placement Confirmation: ETT inserted through vocal cords under direct vision,  positive ETCO2 and breath sounds checked- equal and bilateral Secured at: 21 cm Tube secured with: Tape Dental Injury: Teeth and Oropharynx as per pre-operative assessment

## 2017-05-06 NOTE — Anesthesia Preprocedure Evaluation (Signed)
Anesthesia Evaluation  Patient identified by MRN, date of birth, ID band Patient awake    Reviewed: Allergy & Precautions, H&P , NPO status , Patient's Chart, lab work & pertinent test results  History of Anesthesia Complications Negative for: history of anesthetic complications  Airway Mallampati: II  TM Distance: >3 FB Neck ROM: full    Dental  (+) Chipped, Poor Dentition, Missing   Pulmonary neg shortness of breath, former smoker,           Cardiovascular Exercise Tolerance: Good hypertension, (-) angina+ Peripheral Vascular Disease  (-) Past MI and (-) DOE      Neuro/Psych  Neuromuscular disease negative psych ROS   GI/Hepatic negative GI ROS, Neg liver ROS, neg GERD  ,  Endo/Other  diabetes, Type 2  Renal/GU Renal disease     Musculoskeletal   Abdominal   Peds  Hematology negative hematology ROS (+)   Anesthesia Other Findings Past Medical History: No date: CKD (chronic kidney disease) No date: Hyperlipidemia No date: Hypertension around age 42: MRSA infection     Comment:  left hand wound 01/23/2016: Rheumatoid arthritis, 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HCC) 06/03/2015: Trigeminal neuralgia No date: Type 2 diabetes mellitus, uncontrolled (HCC)  Past Surgical History: No date: APPENDECTOMY No date: TONSILLECTOMY Jan 2015: VASECTOMY  BMI    Body Mass Index:  33.58 kg/m      Reproductive/Obstetrics negative OB ROS                             Anesthesia Physical Anesthesia Plan  ASA: III  Anesthesia Plan: General ETT   Post-op Pain Management:    Induction: Intravenous  PONV Risk Score and Plan: 3 and Ondansetron, Dexamethasone and Midazolam  Airway Management Planned: Oral ETT  Additional Equipment:   Intra-op Plan:   Post-operative Plan: Extubation in OR  Informed Consent: I have reviewed the patients History and Physical, chart, labs and discussed the procedure including  the risks, benefits and alternatives for the proposed anesthesia with the patient or authorized representative who has indicated his/her understanding and acceptance.   Dental Advisory Given  Plan Discussed with: Anesthesiologist, CRNA and Surgeon  Anesthesia Plan Comments: (Patient consented for risks of anesthesia including but not limited to:  - adverse reactions to medications - damage to teeth, lips or other oral mucosa - sore throat or hoarseness - Damage to heart, brain, lungs or loss of life  Patient voiced understanding.)        Anesthesia Quick Evaluation

## 2017-05-06 NOTE — Op Note (Signed)
SURGICAL OPERATIVE REPORT   DATE OF PROCEDURE: 05/06/2017  ATTENDING Surgeon(s): Ancil Linseyavis, Valeta Paz Evan, MD  ANESTHESIA: GETA  PRE-OPERATIVE DIAGNOSIS: Symptomatic Cholelithiasis (K80.20)  POST-OPERATIVE DIAGNOSIS: Symptomatic Cholelithiasis (K80.20)  PROCEDURE(S): (cpt's: 47562) 1.) Laparoscopic Cholecystectomy  INTRAOPERATIVE FINDINGS: Mild pericholecystic inflammation  INTRAOPERATIVE FLUIDS: 800 mL crystalloid   ESTIMATED BLOOD LOSS: Minimal (<30 mL)   URINE OUTPUT: No foley  SPECIMENS: Gallbladder  IMPLANTS: None  DRAINS: None   COMPLICATIONS: None apparent   CONDITION AT COMPLETION: Hemodynamically stable and extubated  DISPOSITION: PACU   INDICATION(S) FOR PROCEDURE:  Patient is a 42 y.o. male who recently presented with post-prandial RUQ > epigastric abdominal pain after eating fatty foods in particular. Ultrasound suggested cholelithiasis without cholecystitis. All risks, benefits, and alternatives to above elective procedures were discussed with the patient, who elected to proceed, and informed consent was accordingly obtained at that time.   DETAILS OF PROCEDURE:  Patient was brought to the operating suite and appropriately identified. General anesthesia was administered along with peri-operative prophylactic IV antibiotics, and endotracheal intubation was performed by anesthesiologist, along with NG/OG tube for gastric decompression. In supine position, operative site was prepped and draped in usual sterile fashion, and following a brief time out, initial 5 mm incision was made in a natural skin crease just above the umbilicus. Fascia was then elevated, and a Verress needle was inserted and its proper position confirmed using aspiration and saline meniscus test.  Upon insufflation of the abdominal cavity with carbon dioxide to a well-tolerated pressure of 12-15 mmHg, 5 mm peri-umbilical port followed by laparoscope were inserted and used to inspect the abdominal  cavity and its contents with no injuries from insertion of the first trochar noted. Three additional trocars were inserted, one at the epigastric position (10 mm) and two along the Right costal margin (5 mm). The table was then placed in reverse Trendelenburg position with the Right side up. Filmy adhesions between the gallbladder and omentum/duodenum/transverse colon were lysed using combined blunt dissection and selective electrocautery. The apex/dome of the gallbladder was grasped with an atraumatic grasper passed through the lateral port and retracted apically over the liver. The infundibulum was also grasped and retracted, exposing Calot's triangle. The peritoneum overlying the gallbladder infundibulum was incised and dissected free of surrounding peritoneal attachments, revealing the cystic duct and cystic artery, which were clipped twice on the patient side and once on the gallbladder specimen side close to the gallbladder. The gallbladder was then dissected from its peritoneal attachments to the liver using electrocautery, and the gallbladder was placed into a laparoscopic specimen bag and removed from the abdominal cavity via the epigastric port site. Hemostasis and secure placement of clips were confirmed, and intra-peritoneal cavity was inspected with no additional findings. PMI laparoscopic fascial closure device was then used to re-approximate fascia at the 10 mm epigastric port site.  All ports were then removed under direct visualization, and abdominal cavity was desuflated. All port sites were irrigated/cleaned, additional local anesthetic was injected at each incision, 3-0 Vicryl was used to re-approximate dermis at 10 mm port site(s), and subcuticular 4-0 Monocryl suture was used to re-approximate skin. Skin was then cleaned, dried, and sterile skin glue was/were applied. Patient was then safely able to be awakened, extubated, and transferred to PACU for post-operative monitoring and care.   I  was present for all aspects of procedure, and there were no intra-operative complications apparent.

## 2017-05-06 NOTE — OR Nursing (Signed)
Per Dr Randa NgoPiscitello he is ok with patients blood pressure. Ok to d.c home

## 2017-05-06 NOTE — Anesthesia Post-op Follow-up Note (Signed)
Anesthesia QCDR form completed.        

## 2017-05-06 NOTE — Interval H&P Note (Signed)
History and Physical Interval Note:  05/06/2017 8:39 AM  Nathan Rojas  has presented today for surgery, with the diagnosis of POLYP OF GALLBLADDER  The various methods of treatment have been discussed with the patient and family. After consideration of risks, benefits and other options for treatment, the patient has consented to  Procedure(s): LAPAROSCOPIC CHOLECYSTECTOMY (N/A) as a surgical intervention .  The patient's history has been reviewed, patient examined, no change in status, stable for surgery.  I have reviewed the patient's chart and labs.  Questions were answered to the patient's satisfaction.     Ancil LinseyJason Evan Davis

## 2017-05-06 NOTE — Discharge Instructions (Addendum)
In addition to included general post-operative instructions for Laparoscopic Cholecystectomy,  Diet: Resume home heart healthy diet (as discussed).   Activity: No heavy lifting >20 pounds (children, pets, laundry, garbage) or strenuous activity until follow-up, but light activity and walking are encouraged. Do not drive or drink alcohol if taking narcotic pain medications.  Wound care: 2 days after surgery (Sunday, 2/10), may shower/get incision wet with soapy water and pat dry (do not rub incisions), but no baths or submerging incision underwater until follow-up.   Medications: Resume all home medications. For mild to moderate pain: acetaminophen (Tylenol) or ibuprofen/naproxen (if no kidney disease). Combining Tylenol with alcohol can substantially increase your risk of causing liver disease. Narcotic pain medications, if prescribed, can be used for severe pain, though may cause nausea, constipation, and drowsiness. Do not combine Tylenol and Percocet (or similar) within a 6 hour period as Percocet (and similar) contain(s) Tylenol. If you do not need the narcotic pain medication, you do not need to fill the prescription.  Call office [phone]) at any time if any questions, worsening pain, fevers/chills, bleeding, drainage from incision site, or other concerns.    AMBULATORY SURGERY  DISCHARGE INSTRUCTIONS   1) The drugs that you were given will stay in your system until tomorrow so for the next 24 hours you should not:  A) Drive an automobile B) Make any legal decisions C) Drink any alcoholic beverage   2) You may resume regular meals tomorrow.  Today it is better to start with liquids and gradually work up to solid foods.  You may eat anything you prefer, but it is better to start with liquids, then soup and crackers, and gradually work up to solid foods.   3) Please notify your doctor immediately if you have any unusual bleeding, trouble breathing, redness and pain at the  surgery site, drainage, fever, or pain not relieved by medication.    4) Additional Instructions:    Please contact your physician with any problems or Same Day Surgery at [phone], Monday through Friday 6 am to 4 pm, or [place] at [facility] Main number at [phone].

## 2017-05-09 LAB — SURGICAL PATHOLOGY

## 2017-05-16 DIAGNOSIS — K8020 Calculus of gallbladder without cholecystitis without obstruction: Secondary | ICD-10-CM

## 2017-05-20 ENCOUNTER — Ambulatory Visit (INDEPENDENT_AMBULATORY_CARE_PROVIDER_SITE_OTHER): Payer: Managed Care, Other (non HMO) | Admitting: General Surgery

## 2017-05-20 ENCOUNTER — Encounter: Payer: Self-pay | Admitting: General Surgery

## 2017-05-20 VITALS — BP 125/88 | HR 73 | Temp 98.1°F | Wt 264.0 lb

## 2017-05-20 DIAGNOSIS — Z4889 Encounter for other specified surgical aftercare: Secondary | ICD-10-CM

## 2017-05-20 NOTE — Patient Instructions (Signed)

## 2017-05-20 NOTE — Progress Notes (Signed)
Outpatient Surgical Follow Up  05/20/2017  Nathan Rojas is an Nathan Rojas y.o. male.   Chief Complaint  Patient presents with  . Routine Post Op    LAPAROSCOPIC CHOLECYSTECTOMY 05/06/17 Dr. Earlene Plateravis    HPI: Nathan Rojas year old male returns to clinic 2 weeks status post laparoscopic cholecystectomy.  Patient reports doing very well.  He denies any pain.  He is eating well but states he has the occasional loose stool if he eats a fatty meal.  He denies any fevers, chills, nausea, vomiting, chest pain, shortness of breath.  He is been very happy with his surgical experience.  Past Medical History:  Diagnosis Date  . CKD (chronic kidney disease)   . Hyperlipidemia   . Hypertension   . MRSA infection around age Nathan Rojas   left hand wound  . Rheumatoid arthritis, 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HCC) 01/23/2016  . Trigeminal neuralgia 06/03/2015  . Type 2 diabetes mellitus, uncontrolled (HCC)     Past Surgical History:  Procedure Laterality Date  . APPENDECTOMY    . CHOLECYSTECTOMY N/A 05/06/2017   Procedure: LAPAROSCOPIC CHOLECYSTECTOMY;  Surgeon: Ancil Linseyavis, Jason Evan, MD;  Location: [facility] ORS;  Service: General;  Laterality: N/A;  . TONSILLECTOMY    . VASECTOMY  Jan 2015    Family History  Problem Relation Age of Onset  . Diabetes Sister        type 1  . Diabetes Mother   . Dementia Maternal Grandmother   . AAA (abdominal aortic aneurysm) Maternal Grandfather   . Hypertension Maternal Grandfather     Social History:  reports that he quit smoking about 2 years ago. His smoking use included cigarettes. He has a 25.00 pack-year smoking history. he has never used smokeless tobacco. He reports that he drinks about 1.8 oz of alcohol per week. He reports that he does not use drugs.  Allergies: No Known Allergies  Medications reviewed.    ROS A multipoint review of systems as documented in the HPI all pertinent positives were there and the remainder are negative.   BP 125/88   Pulse 73   Temp 98.1 F (36.7 C) (Oral)   Wt  119.7 kg (264 lb)   BMI 34.36 kg/m   Physical Exam General: No acute distress Chest: Clear to auscultation Heart: Rate and rhythm Abdomen: Soft, nontender, nondistended.  Well approximated laparoscopic incisions with Dermabond still in place.  No evidence of erythema or drainage.    No results found for this or any previous visit (from the past 48 hour(s)). No results found.  Assessment/Plan:  1. Aftercare following surgery Nathan Rojas year old male status post laparoscopic cholecystectomy.  Doing very well.  Provided standard postoperative precautions and he will follow-up on an as-needed basis.     Ricarda Frameharles Shamira Toutant, MD [facility] Surgeon  05/20/2017,9:23 AM

## 2017-05-22 ENCOUNTER — Other Ambulatory Visit: Payer: Self-pay | Admitting: Family Medicine

## 2017-05-22 NOTE — Telephone Encounter (Signed)
Last Cr and K+ reviewed; Rx approved 

## 2017-06-10 ENCOUNTER — Ambulatory Visit: Payer: Managed Care, Other (non HMO) | Admitting: Family Medicine

## 2018-01-04 ENCOUNTER — Ambulatory Visit (INDEPENDENT_AMBULATORY_CARE_PROVIDER_SITE_OTHER): Payer: Managed Care, Other (non HMO) | Admitting: Family Medicine

## 2018-01-04 ENCOUNTER — Encounter: Payer: Self-pay | Admitting: Family Medicine

## 2018-01-04 VITALS — BP 118/82 | HR 74 | Temp 98.0°F | Ht 74.0 in | Wt 257.5 lb

## 2018-01-04 DIAGNOSIS — E6609 Other obesity due to excess calories: Secondary | ICD-10-CM | POA: Diagnosis not present

## 2018-01-04 DIAGNOSIS — Z23 Encounter for immunization: Secondary | ICD-10-CM | POA: Diagnosis not present

## 2018-01-04 DIAGNOSIS — E1165 Type 2 diabetes mellitus with hyperglycemia: Secondary | ICD-10-CM

## 2018-01-04 DIAGNOSIS — E782 Mixed hyperlipidemia: Secondary | ICD-10-CM | POA: Diagnosis not present

## 2018-01-04 DIAGNOSIS — Z0000 Encounter for general adult medical examination without abnormal findings: Secondary | ICD-10-CM | POA: Diagnosis not present

## 2018-01-04 DIAGNOSIS — Z6833 Body mass index (BMI) 33.0-33.9, adult: Secondary | ICD-10-CM

## 2018-01-04 DIAGNOSIS — I10 Essential (primary) hypertension: Secondary | ICD-10-CM | POA: Diagnosis not present

## 2018-01-04 LAB — POCT GLYCOSYLATED HEMOGLOBIN (HGB A1C)
HbA1c POC (<> result, manual entry): 12.5 % (ref 4.0–5.6)
HbA1c, POC (controlled diabetic range): 12.5 % — AB (ref 0.0–7.0)
HbA1c, POC (prediabetic range): 12.5 % — AB (ref 5.7–6.4)
Hemoglobin A1C: 12.5 % — AB (ref 4.0–5.6)

## 2018-01-04 LAB — GLUCOSE, POCT (MANUAL RESULT ENTRY): POC Glucose: 171 mg/dl — AB (ref 70–99)

## 2018-01-04 MED ORDER — INSULIN GLARGINE 100 UNIT/ML ~~LOC~~ SOLN: 10.0000 [IU] | Freq: Every day | SUBCUTANEOUS | 11 refills | Status: DC

## 2018-01-04 MED ORDER — INSULIN GLARGINE 100 UNIT/ML SOLOSTAR PEN: 10.0000 [IU] | PEN_INJECTOR | Freq: Every day | SUBCUTANEOUS | 99 refills | Status: DC

## 2018-01-04 MED ORDER — INSULIN REGULAR HUMAN 100 UNIT/ML IJ SOLN: 20.0000 [IU] | Freq: Three times a day (TID) | INTRAMUSCULAR | 3 refills | Status: DC

## 2018-01-04 MED ORDER — ICOSAPENT ETHYL 1 G PO CAPS: ORAL_CAPSULE | ORAL | 3 refills | Status: DC

## 2018-01-04 MED ORDER — LISINOPRIL 5 MG PO TABS: 5.0000 mg | ORAL_TABLET | Freq: Every day | ORAL | 3 refills | Status: DC

## 2018-01-04 MED ORDER — ATORVASTATIN CALCIUM 20 MG PO TABS: 20.0000 mg | ORAL_TABLET | Freq: Every day | ORAL | 1 refills | Status: DC

## 2018-01-04 MED ORDER — INSULIN NPH (HUMAN) (ISOPHANE) 100 UNIT/ML ~~LOC~~ SUSP: 10.0000 [IU] | Freq: Every day | SUBCUTANEOUS | 3 refills | Status: DC

## 2018-01-04 NOTE — Patient Instructions (Addendum)
Please do see your eye doctor regularly, and have your eyes examined every year (or more often per his or her recommendation) Check your feet every night and let me know right away of any sores, infections, numbness, etc. Try to limit sweets, white bread, white rice, white potatoes Please check your fingerstick blood sugars 3 times a day We'll have you see the endocrinologist Start back on 10 units of once daily insulin, same time of day every day Use your short-acting to cover your meals Start back on medicines (see list) Return in 2 weeks for blood pressure check and BMP to make sure the lisinopril and your kidneys are getting along You have got this!! Take good care of yourself   Health Maintenance, Male A healthy lifestyle and preventive care is important for your health and wellness. Ask your health care provider about what schedule of regular examinations is right for you. What should I know about weight and diet? Eat a Healthy Diet  Eat plenty of vegetables, fruits, whole grains, low-fat dairy products, and lean protein.  Do not eat a lot of foods high in solid fats, added sugars, or salt.  Maintain a Healthy Weight Regular exercise can help you achieve or maintain a healthy weight. You should:  Do at least 150 minutes of exercise each week. The exercise should increase your heart rate and make you sweat (moderate-intensity exercise).  Do strength-training exercises at least twice a week.  Watch Your Levels of Cholesterol and Blood Lipids  Have your blood tested for lipids and cholesterol every 5 years starting at 44 years of age. If you are at high risk for heart disease, you should start having your blood tested when you are 44 years old. You may need to have your cholesterol levels checked more often if: ? Your lipid or cholesterol levels are high. ? You are older than 44 years of age. ? You are at high risk for heart disease.  What should I know about cancer  screening? Many types of cancers can be detected early and may often be prevented. Lung Cancer  You should be screened every year for lung cancer if: ? You are a current smoker who has smoked for at least 30 years. ? You are a former smoker who has quit within the past 15 years.  Talk to your health care provider about your screening options, when you should start screening, and how often you should be screened.  Colorectal Cancer  Routine colorectal cancer screening usually begins at 44 years of age and should be repeated every 5-10 years until you are 44 years old. You may need to be screened more often if early forms of precancerous polyps or small growths are found. Your health care provider may recommend screening at an earlier age if you have risk factors for colon cancer.  Your health care provider may recommend using home test kits to check for hidden blood in the stool.  A small camera at the end of a tube can be used to examine your colon (sigmoidoscopy or colonoscopy). This checks for the earliest forms of colorectal cancer.  Prostate and Testicular Cancer  Depending on your age and overall health, your health care provider may do certain tests to screen for prostate and testicular cancer.  Talk to your health care provider about any symptoms or concerns you have about testicular or prostate cancer.  Skin Cancer  Check your skin from head to toe regularly.  Tell your health care provider  about any new moles or changes in moles, especially if: ? There is a change in a mole's size, shape, or color. ? You have a mole that is larger than a pencil eraser.  Always use sunscreen. Apply sunscreen liberally and repeat throughout the day.  Protect yourself by wearing long sleeves, pants, a wide-brimmed hat, and sunglasses when outside.  What should I know about heart disease, diabetes, and high blood pressure?  If you are 37-73 years of age, have your blood pressure checked  every 3-5 years. If you are 40 years of age or older, have your blood pressure checked every year. You should have your blood pressure measured twice-once when you are at a hospital or clinic, and once when you are not at a hospital or clinic. Record the average of the two measurements. To check your blood pressure when you are not at a hospital or clinic, you can use: ? An automated blood pressure machine at a pharmacy. ? A home blood pressure monitor.  Talk to your health care provider about your target blood pressure.  If you are between 77-32 years old, ask your health care provider if you should take aspirin to prevent heart disease.  Have regular diabetes screenings by checking your fasting blood sugar level. ? If you are at a normal weight and have a low risk for diabetes, have this test once every three years after the age of 39. ? If you are overweight and have a high risk for diabetes, consider being tested at a younger age or more often.  A one-time screening for abdominal aortic aneurysm (AAA) by ultrasound is recommended for men aged 65-75 years who are current or former smokers. What should I know about preventing infection? Hepatitis B If you have a higher risk for hepatitis B, you should be screened for this virus. Talk with your health care provider to find out if you are at risk for hepatitis B infection. Hepatitis C Blood testing is recommended for:  Everyone born from [birth year] through 1967.  Anyone with known risk factors for hepatitis C.  Sexually Transmitted Diseases (STDs)  You should be screened each year for STDs including gonorrhea and chlamydia if: ? You are sexually active and are younger than 44 years of age. ? You are older than 44 years of age and your health care provider tells you that you are at risk for this type of infection. ? Your sexual activity has changed since you were last screened and you are at an increased risk for chlamydia or gonorrhea. Ask your  health care provider if you are at risk.  Talk with your health care provider about whether you are at high risk of being infected with HIV. Your health care provider may recommend a prescription medicine to help prevent HIV infection.  What else can I do?  Schedule regular health, dental, and eye exams.  Stay current with your vaccines (immunizations).  Do not use any tobacco products, such as cigarettes, chewing tobacco, and e-cigarettes. If you need help quitting, ask your health care provider.  Limit alcohol intake to no more than 2 drinks per day. One drink equals 12 ounces of beer, 5 ounces of wine, or 1 ounces of hard liquor.  Do not use street drugs.  Do not share needles.  Ask your health care provider for help if you need support or information about quitting drugs.  Tell your health care provider if you often feel depressed.  Tell your health care  provider if you have ever been abused or do not feel safe at home. This information is not intended to replace advice given to you by your health care provider. Make sure you discuss any questions you have with your health care provider. Document Released: 09/11/2007 Document Revised: 11/12/2015 Document Reviewed: 12/17/2014 Elsevier Interactive Patient Education  [employer].

## 2018-01-04 NOTE — Progress Notes (Signed)
BP 118/82   Pulse 74   Temp 98 F (36.7 C) (Oral)   Ht 6\' 2"  (1.88 m)   Wt 257 lb 8 oz (116.8 kg)   SpO2 99%   BMI 33.06 kg/m    Subjective:    Patient ID: Nathan Rojas, male    DOB: [birth year], 42 y.o.   MRN: [id number]  HPI: Nathan Rojas is a 42 y.o. male  Chief Complaint  Patient presents with  . Annual Exam  . Medication Refill    not on any meds at this time due to needing refills    HPI Here for his physical, but he also wants diabetes and follow-up for medicines, etc.  ------------------------------------------- USPSTF grade A and B recommendations Depression:  Depression screen [facility] 2/9 01/04/2018 04/27/2017 04/01/2017 03/11/2017 07/09/2016  Decreased Interest 0 0 0 0 0  Down, Depressed, Hopeless 0 0 0 0 0  PHQ - 2 Score 0 0 0 0 0  Altered sleeping 0 - - - -  Tired, decreased energy 0 - - - -  Change in appetite 0 - - - -  Feeling bad or failure about yourself  0 - - - -  Trouble concentrating 0 - - - -  Moving slowly or fidgety/restless 0 - - - -  Suicidal thoughts 0 - - - -  PHQ-9 Score 0 - - - -  Difficult doing work/chores Not difficult at all - - - -   Hypertension: BP Readings from Last 3 Encounters:  01/04/18 118/82  05/20/17 125/88  05/06/17 (!) 152/103   Obesity: Wt Readings from Last 3 Encounters:  01/04/18 257 lb 8 oz (116.8 kg)  05/20/17 264 lb (119.7 kg)  05/06/17 258 lb (117 kg)   BMI Readings from Last 3 Encounters:  01/04/18 33.06 kg/m  05/20/17 34.36 kg/m  05/06/17 33.58 kg/m    Immunizations: UTD, flu shot today  Skin cancer: benign looking on the face Lung cancer:  Quit now for almost 3 years; no craving Prostate cancer: start at age 60; no fam hx No results found for: PSA Colorectal cancer: start at age 45; no fam hx AAA: n/a Aspirin: taking aspirin 81 mg Diet: off of the wagon Exercise: no regular exercise; job is pretty active Alcohol:    Office Visit from 01/04/2018 in [facility]  AUDIT-C  Score  1     Tobacco use: quit almost 3 years ago HIV, hep B, hep C: not interested STD testing and prevention (chl/gon/syphilis): not interested Glucose:  Glucose, Bld  Date Value Ref Range Status  01/04/2018 156 (H) 65 - 99 mg/dL Final    Comment:    .            Fasting reference interval . For someone without known diabetes, a glucose value >125 mg/dL indicates that they may have diabetes and this should be confirmed with a follow-up test. .   04/29/2017 197 (H) 65 - 99 mg/dL Final  40/98/1191 478 (H) 65 - 99 mg/dL Final    Comment:    .            Fasting reference interval . For someone without known diabetes, a glucose value >125 mg/dL indicates that they may have diabetes and this should be confirmed with a follow-up test. .    Glucose-Capillary  Date Value Ref Range Status  05/06/2017 164 (H) 65 - 99 mg/dL Final  29/56/2130 865 (H) 65 - 99 mg/dL Final  Lipids:  Lab Results  Component Value Date   CHOL 160 01/04/2018   CHOL 133 03/11/2017   CHOL 114 07/09/2016   Lab Results  Component Value Date   HDL 25 (L) 01/04/2018   HDL 33 (L) 03/11/2017   HDL 21 (L) 07/09/2016   Lab Results  Component Value Date   LDLCALC 96 01/04/2018   LDLCALC 81 03/11/2017   LDLCALC 58 07/09/2016   Lab Results  Component Value Date   TRIG 270 (H) 01/04/2018   TRIG 94 03/11/2017   TRIG 176 (H) 07/09/2016   Lab Results  Component Value Date   CHOLHDL 6.4 (H) 01/04/2018   CHOLHDL 4.0 03/11/2017   CHOLHDL 5.4 (H) 07/09/2016   No results found for: LDLDIRECT  ------------------------------------------- Type 2 diabetes; "not good"; just not taking care of himself; not eating properly Sugar has been high; highest in the last week 420 Running between 200 and 400 lately Last medicine was insulin last night after dinner, 30 units of fast-acting He has not been using any medicine except for the fast-acting insulin here and there He is aware he can there is insulin  available without a prescription Glucose today (FSBS) was 171 right here in the office  High cholesterol; not taking vascepa or atorvastatin; last was about two months ago; here FASTING;   HTN; off of lisinopril for a while; last was about 2 months  Flu shot today  Vitamin D deificiency; taking 2000 iu daily for a long time; but helping joints  Rheumatoid arthritis; managed by rheum; not taking MTX   Depression screen [facility] 2/9 01/04/2018 04/27/2017 04/01/2017 03/11/2017 07/09/2016  Decreased Interest 0 0 0 0 0  Down, Depressed, Hopeless 0 0 0 0 0  PHQ - 2 Score 0 0 0 0 0  Altered sleeping 0 - - - -  Tired, decreased energy 0 - - - -  Change in appetite 0 - - - -  Feeling bad or failure about yourself  0 - - - -  Trouble concentrating 0 - - - -  Moving slowly or fidgety/restless 0 - - - -  Suicidal thoughts 0 - - - -  PHQ-9 Score 0 - - - -  Difficult doing work/chores Not difficult at all - - - -   Fall Risk  01/04/2018 04/27/2017 04/22/2017 04/01/2017 03/11/2017  Falls in the past year? No No No No No    Relevant past medical, surgical, family and social history reviewed Past Medical History:  Diagnosis Date  . CKD (chronic kidney disease)   . Hyperlipidemia   . Hypertension   . MRSA infection around age 29   left hand wound  . Rheumatoid arthritis, 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HCC) 01/23/2016  . Trigeminal neuralgia 06/03/2015  . Type 2 diabetes mellitus, uncontrolled (HCC)    Past Surgical History:  Procedure Laterality Date  . APPENDECTOMY    . CHOLECYSTECTOMY N/A 05/06/2017   Procedure: LAPAROSCOPIC CHOLECYSTECTOMY;  Surgeon: Ancil Linsey, MD;  Location: [facility] ORS;  Service: General;  Laterality: N/A;  . TONSILLECTOMY    . VASECTOMY  Jan 2015   Family History  Problem Relation Age of Onset  . Diabetes Sister        type 1  . Diabetes Mother   . Dementia Maternal Grandmother   . AAA (abdominal aortic aneurysm) Maternal Grandfather   . Hypertension Maternal Grandfather    Social  History   Tobacco Use  . Smoking status: Former Smoker    Packs/day: 1.00  Years: 25.00    Pack years: 25.00    Types: Cigarettes    Last attempt to quit: 02/08/2015    Years since quitting: 2.9  . Smokeless tobacco: Never Used  Substance Use Topics  . Alcohol use: Yes    Alcohol/week: 3.0 standard drinks    Types: 2 Cans of beer, 1 Shots of liquor per week    Comment: 1-4 drinks/week  . Drug use: No     Office Visit from 01/04/2018 in [facility]  AUDIT-C Score  1      Interim medical history since last visit reviewed. Allergies and medications reviewed  Review of Systems  Constitutional: Negative for unexpected weight change (fluctuates within 10 pounds).  HENT: Negative for hearing loss.   Eyes: Negative for visual disturbance.  Respiratory: Negative for wheezing.   Cardiovascular: Negative for chest pain.  Gastrointestinal: Negative for blood in stool.  Genitourinary: Negative for hematuria.  Skin:       No new moles  Neurological: Negative for tremors.  Hematological: Negative for adenopathy. Does not bruise/bleed easily.  Psychiatric/Behavioral: Negative for decreased concentration and dysphoric mood.   Per HPI unless specifically indicated above     Objective:    BP 118/82   Pulse 74   Temp 98 F (36.7 C) (Oral)   Ht 6\' 2"  (1.88 m)   Wt 257 lb 8 oz (116.8 kg)   SpO2 99%   BMI 33.06 kg/m   Wt Readings from Last 3 Encounters:  01/04/18 257 lb 8 oz (116.8 kg)  05/20/17 264 lb (119.7 kg)  05/06/17 258 lb (117 kg)    Physical Exam  Constitutional: He appears well-developed and well-nourished. No distress.  HENT:  Head: Normocephalic and atraumatic.  Nose: Nose normal.  Mouth/Throat: Oropharynx is clear and moist. No posterior oropharyngeal edema or posterior oropharyngeal erythema.  Few missing teeth  Eyes: EOM are normal. No scleral icterus.  Neck: No JVD present. No thyromegaly present.  Cardiovascular: Normal rate, regular  rhythm and normal heart sounds.  Pulmonary/Chest: Effort normal and breath sounds normal. No respiratory distress. He has no wheezes. He has no rales.  Abdominal: Soft. Bowel sounds are normal. He exhibits no distension. There is no tenderness. There is no guarding.  Musculoskeletal: Normal range of motion. He exhibits no edema.  Lymphadenopathy:    He has no cervical adenopathy.  Neurological: He is alert. He displays normal reflexes. He exhibits normal muscle tone. Coordination normal.  Skin: Skin is warm and dry. No rash noted. He is not diaphoretic. No erythema. No pallor.  Psychiatric: He has a normal mood and affect. His behavior is normal. Judgment and thought content normal. His mood appears not anxious. He does not exhibit a depressed mood.   Diabetic Foot Form - Detailed   Diabetic Foot Exam - detailed Diabetic Foot exam was performed with the following findings:  Yes 01/04/2018  7:58 AM  Visual Foot Exam completed.:  Yes  Pulse Foot Exam completed.:  Yes  Right Dorsalis Pedis:  Present Left Dorsalis Pedis:  Present  Sensory Foot Exam Completed.:  Yes Semmes-Weinstein Monofilament Test R Site 1-Great Toe:  Pos L Site 1-Great Toe:  Pos          Assessment & Plan:   Problem List Items Addressed This Visit      Cardiovascular and Mediastinum   Hypertension (Chronic)    Controlled today; modest weight loss, DASH guidelines will help      Relevant Medications  Icosapent Ethyl (VASCEPA) 1 g CAPS   atorvastatin (LIPITOR) 20 MG tablet   lisinopril (PRINIVIL,ZESTRIL) 5 MG tablet     Endocrine   Type 2 diabetes mellitus, uncontrolled (HCC) (Chronic)    A1c is out of control; will refer to endocrinologist; encouraged better self-care; foot exam by MD today; refer to eye doctor      Relevant Medications   insulin glargine (LANTUS) 100 UNIT/ML injection   insulin regular (HUMULIN R) 100 units/mL injection   atorvastatin (LIPITOR) 20 MG tablet   lisinopril (PRINIVIL,ZESTRIL)  5 MG tablet   Other Relevant Orders   Ambulatory referral to Ophthalmology   POCT HgB A1C (Completed)   POCT Glucose (CBG) (Completed)   Microalbumin / creatinine urine ratio (Completed)   Ambulatory referral to Endocrinology     Other   Preventative health care - Primary    USPSTF grade A and B recommendations reviewed with patient; age-appropriate recommendations, preventive care, screening tests, etc discussed and encouraged; healthy living encouraged; see AVS for patient education given to patient       Relevant Orders   COMPLETE METABOLIC PANEL WITH GFR (Completed)   Lipid panel (Completed)   CBC with Differential/Platelet (Completed)   TSH (Completed)   Obesity (Chronic)    Encouraged weight loss; this is impacting his diabetes, pressure, and lipids      Relevant Medications   insulin glargine (LANTUS) 100 UNIT/ML injection   insulin regular (HUMULIN R) 100 units/mL injection   Hyperlipidemia (Chronic)    Check lipids; work on weigh tloss, healthier eating, taking better care of himself      Relevant Medications   Icosapent Ethyl (VASCEPA) 1 g CAPS   atorvastatin (LIPITOR) 20 MG tablet   lisinopril (PRINIVIL,ZESTRIL) 5 MG tablet    Other Visit Diagnoses    Need for influenza vaccination       Relevant Orders   Flu Vaccine QUAD 6+ mos PF IM (Fluarix Quad PF) (Completed)       Follow up plan: Return in about 2 weeks (around 01/18/2018) for blood pressure recheck and BMP with CMA; 3 months with Dr. Sherie Don.  An after-visit summary was printed and given to the patient at check-out.  Please see the patient instructions which may contain other information and recommendations beyond what is mentioned above in the assessment and plan.  Meds ordered this encounter  Medications  . DISCONTD: insulin NPH Human (HUMULIN N) 100 UNIT/ML injection    Sig: Inject 0.1 mLs (10 Units total) into the skin daily before breakfast.    Dispense:  3 vial    Refill:  3  . DISCONTD:  Insulin Glargine (LANTUS SOLOSTAR) 100 UNIT/ML Solostar Pen    Sig: Inject 10 Units into the skin daily. (same time of day)    Dispense:  5 pen    Refill:  PRN  . insulin glargine (LANTUS) 100 UNIT/ML injection    Sig: Inject 0.1 mLs (10 Units total) into the skin daily.    Dispense:  10 mL    Refill:  11    Sorry for changing my mind! Please use this insulin and NOT the NPH or the Lantus solostar  . insulin regular (HUMULIN R) 100 units/mL injection    Sig: Inject 0.2-0.3 mLs (20-30 Units total) into the skin 3 (three) times daily before meals.    Dispense:  9 vial    Refill:  3  . Icosapent Ethyl (VASCEPA) 1 g CAPS    Sig: TAKE 2 CAPSULES by mouth  TWICE A DAY    Dispense:  360 capsule    Refill:  3  . atorvastatin (LIPITOR) 20 MG tablet    Sig: Take 1 tablet (20 mg total) by mouth at bedtime.    Dispense:  90 tablet    Refill:  1  . lisinopril (PRINIVIL,ZESTRIL) 5 MG tablet    Sig: Take 1 tablet (5 mg total) by mouth daily.    Dispense:  90 tablet    Refill:  3    Orders Placed This Encounter  Procedures  . Flu Vaccine QUAD 6+ mos PF IM (Fluarix Quad PF)  . COMPLETE METABOLIC PANEL WITH GFR  . Lipid panel  . Microalbumin / creatinine urine ratio  . CBC with Differential/Platelet  . TSH  . Hemoglobin A1C w/out eAG  . Ambulatory referral to Ophthalmology  . Ambulatory referral to Endocrinology  . POCT HgB A1C  . POCT Glucose (CBG)

## 2018-01-05 LAB — COMPLETE METABOLIC PANEL WITH GFR
AG Ratio: 1.8 (calc) (ref 1.0–2.5)
ALT: 41 U/L (ref 9–46)
AST: 31 U/L (ref 10–40)
Albumin: 4.5 g/dL (ref 3.6–5.1)
Alkaline phosphatase (APISO): 70 U/L (ref 40–115)
BUN: 15 mg/dL (ref 7–25)
CO2: 26 mmol/L (ref 20–32)
Calcium: 9.4 mg/dL (ref 8.6–10.3)
Chloride: 101 mmol/L (ref 98–110)
Creat: 0.83 mg/dL (ref 0.60–1.35)
GFR, Est African American: 124 mL/min/{1.73_m2} (ref >=60)
GFR, Est Non African American: 107 mL/min/{1.73_m2} (ref >=60)
Globulin: 2.5 g/dL (calc) (ref 1.9–3.7)
Glucose, Bld: 156 mg/dL — ABNORMAL HIGH (ref 65–99)
Potassium: 3.7 mmol/L (ref 3.5–5.3)
Sodium: 135 mmol/L (ref 135–146)
Total Bilirubin: 1.1 mg/dL (ref 0.2–1.2)
Total Protein: 7 g/dL (ref 6.1–8.1)

## 2018-01-05 LAB — CBC WITH DIFFERENTIAL/PLATELET
Basophils Absolute: 49 cells/uL (ref 0–200)
Basophils Relative: 1 %
Eosinophils Absolute: 88 cells/uL (ref 15–500)
Eosinophils Relative: 1.8 %
HCT: 45.3 % (ref 38.5–50.0)
Hemoglobin: 15.3 g/dL (ref 13.2–17.1)
Lymphs Abs: 1588 cells/uL (ref 850–3900)
MCH: 28.7 pg (ref 27.0–33.0)
MCHC: 33.8 g/dL (ref 32.0–36.0)
MCV: 84.8 fL (ref 80.0–100.0)
MPV: 11.2 fL (ref 7.5–12.5)
Monocytes Relative: 6.9 %
Neutro Abs: 2837 cells/uL (ref 1500–7800)
Neutrophils Relative %: 57.9 %
Platelets: 259 10*3/uL (ref 140–400)
RBC: 5.34 10*6/uL (ref 4.20–5.80)
RDW: 12.7 % (ref 11.0–15.0)
Total Lymphocyte: 32.4 %
WBC mixed population: 338 cells/uL (ref 200–950)
WBC: 4.9 10*3/uL (ref 3.8–10.8)

## 2018-01-05 LAB — LIPID PANEL
Cholesterol: 160 mg/dL (ref <200)
HDL: 25 mg/dL — ABNORMAL LOW (ref >40)
LDL Cholesterol (Calc): 96 mg/dL (calc)
Non-HDL Cholesterol (Calc): 135 mg/dL (calc) — ABNORMAL HIGH (ref <130)
Total CHOL/HDL Ratio: 6.4 (calc) — ABNORMAL HIGH (ref <5.0)
Triglycerides: 270 mg/dL — ABNORMAL HIGH (ref <150)

## 2018-01-05 LAB — MICROALBUMIN / CREATININE URINE RATIO
Creatinine, Urine: 128 mg/dL (ref 20–320)
Microalb Creat Ratio: 9 mcg/mg creat (ref <30)
Microalb, Ur: 1.1 mg/dL

## 2018-01-05 LAB — TSH: TSH: 1.5 mIU/L (ref 0.40–4.50)

## 2018-01-05 LAB — HEMOGLOBIN A1C W/OUT EAG: Hgb A1c MFr Bld: 11.5 % of total Hgb — ABNORMAL HIGH (ref <5.7)

## 2018-01-10 DIAGNOSIS — Z1211 Encounter for screening for malignant neoplasm of colon: Secondary | ICD-10-CM | POA: Insufficient documentation

## 2018-01-10 DIAGNOSIS — Z0000 Encounter for general adult medical examination without abnormal findings: Secondary | ICD-10-CM | POA: Insufficient documentation

## 2018-01-10 NOTE — Assessment & Plan Note (Signed)
Controlled today; modest weight loss, DASH guidelines will help

## 2018-01-10 NOTE — Assessment & Plan Note (Signed)
USPSTF grade A and B recommendations reviewed with patient; age-appropriate recommendations, preventive care, screening tests, etc discussed and encouraged; healthy living encouraged; see AVS for patient education given to patient  

## 2018-01-10 NOTE — Assessment & Plan Note (Signed)
Check lipids; work on weigh tloss, healthier eating, taking better care of himself

## 2018-01-10 NOTE — Assessment & Plan Note (Signed)
Encouraged weight loss; this is impacting his diabetes, pressure, and lipids

## 2018-01-10 NOTE — Assessment & Plan Note (Signed)
A1c is out of control; will refer to endocrinologist; encouraged better self-care; foot exam by MD today; refer to eye doctor

## 2018-01-18 ENCOUNTER — Ambulatory Visit: Payer: Managed Care, Other (non HMO)

## 2018-01-18 VITALS — BP 118/84 | HR 65

## 2018-01-18 DIAGNOSIS — I10 Essential (primary) hypertension: Secondary | ICD-10-CM

## 2018-01-18 NOTE — Progress Notes (Signed)
Pt here for blood pressure check since starting back on Lisinopril and lower dose of 5mg .  Patient denies any side effects and blood pressure today is 118/84 and pulse 65.  Pt was told to continue current regimen unless otherwise noted by Dr. Sherie Don.  BMP was checked today.

## 2018-01-19 LAB — BASIC METABOLIC PANEL
BUN: 17 mg/dL (ref 7–25)
CO2: 27 mmol/L (ref 20–32)
Calcium: 9.3 mg/dL (ref 8.6–10.3)
Chloride: 103 mmol/L (ref 98–110)
Creat: 0.74 mg/dL (ref 0.60–1.35)
Glucose, Bld: 166 mg/dL — ABNORMAL HIGH (ref 65–139)
Potassium: 4.1 mmol/L (ref 3.5–5.3)
Sodium: 137 mmol/L (ref 135–146)

## 2018-04-07 ENCOUNTER — Encounter: Payer: Self-pay | Admitting: Family Medicine

## 2018-04-07 ENCOUNTER — Ambulatory Visit (INDEPENDENT_AMBULATORY_CARE_PROVIDER_SITE_OTHER): Payer: BLUE CROSS/BLUE SHIELD | Admitting: Family Medicine

## 2018-04-07 VITALS — BP 122/74 | HR 95 | Temp 97.8°F | Ht 74.0 in | Wt 260.4 lb

## 2018-04-07 DIAGNOSIS — I700 Atherosclerosis of aorta: Secondary | ICD-10-CM | POA: Diagnosis not present

## 2018-04-07 DIAGNOSIS — I10 Essential (primary) hypertension: Secondary | ICD-10-CM

## 2018-04-07 DIAGNOSIS — E1165 Type 2 diabetes mellitus with hyperglycemia: Secondary | ICD-10-CM | POA: Diagnosis not present

## 2018-04-07 DIAGNOSIS — M059 Rheumatoid arthritis with rheumatoid factor, unspecified: Secondary | ICD-10-CM

## 2018-04-07 DIAGNOSIS — E782 Mixed hyperlipidemia: Secondary | ICD-10-CM

## 2018-04-07 DIAGNOSIS — Z6833 Body mass index (BMI) 33.0-33.9, adult: Secondary | ICD-10-CM

## 2018-04-07 DIAGNOSIS — N182 Chronic kidney disease, stage 2 (mild): Secondary | ICD-10-CM | POA: Diagnosis not present

## 2018-04-07 DIAGNOSIS — E6609 Other obesity due to excess calories: Secondary | ICD-10-CM

## 2018-04-07 NOTE — Assessment & Plan Note (Signed)
Encouraged limiting saturated fats and weight loss

## 2018-04-07 NOTE — Assessment & Plan Note (Signed)
Limiting NSAIDs

## 2018-04-07 NOTE — Progress Notes (Signed)
BP 122/74   Pulse 95   Temp 97.8 F (36.6 C) (Oral)   Ht 6\' 2"  (1.88 m)   Wt 260 lb 6.4 oz (118.1 kg)   SpO2 97%   BMI 33.43 kg/m    Subjective:    Patient ID: Nathan Rojas, male    DOB: Nov 20, 1975, 43 y.o.   MRN: [id number]  HPI: Nathan Rojas is a 43 y.o. male  Chief Complaint  Patient presents with  . Follow-up    HPI Here for f/u Type 2 diabetes; specialist has him on 20 units long-acting and 20 units short-acting withmeals Sugars running 120-160 range No lows in the last months He can feel it around 70, grabs something to eats No symptoms of high sugars lately No problems with feet Due for eye exam Lab Results  Component Value Date   HGBA1C 12.5 (A) 01/04/2018   HGBA1C 12.5 01/04/2018   HGBA1C 12.5 (A) 01/04/2018   HGBA1C 12.5 (A) 01/04/2018   HTN Well-controlled; some potato chips; not adding salt to food  Obesity; he would like to weigh 200 pounds He likes to eat not exercising He does love vegetables; he does no care for corn or potatoes; loves greens, brussel sprouts and cabbages; wife does not eat veggies; patient just buys frozen bagged veggies and microwaves Going to keep golfing; 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 Renard Matter; not hurting or anything Avoiding NSAIDs for the most part; maybe an Advil once a week Lab Results  Component Value Date   CREATININE 0.74 01/18/2018       Depression screen [facility] 2/9 04/07/2018 01/04/2018 04/27/2017 04/01/2017  03/11/2017  Decreased Interest 0 0 0 0 0  Down, Depressed, Hopeless 0 0 0 0 0  PHQ - 2 Score 0 0 0 0 0  Altered sleeping 0 0 - - -  Tired, decreased energy 0 0 - - -  Change in appetite 0 0 - - -  Feeling bad or failure about yourself  0 0 - - -  Trouble concentrating 0 0 - - -  Moving slowly or fidgety/restless 0 0 - - -  Suicidal thoughts 0 0 - - -  PHQ-9 Score 0 0 - - -  Difficult doing work/chores Not difficult at all Not difficult at all - - -   Fall Risk  04/07/2018 01/04/2018 04/27/2017 04/22/2017 04/01/2017  Falls in the past year? 0 No No No No  Number falls in past yr: 0 - - - -  Injury with Fall? 0 - - - -    Relevant past medical, surgical, family and social history reviewed Past Medical History:  Diagnosis Date  . CKD (chronic kidney disease)   . Hyperlipidemia   . Hypertension   . MRSA infection around age 56   left hand wound  . Rheumatoid arthritis, 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HCC) 01/23/2016  . Trigeminal neuralgia 06/03/2015  . Type 2 diabetes mellitus, uncontrolled (HCC)  Past Surgical History:  Procedure Laterality Date  . APPENDECTOMY    . CHOLECYSTECTOMY N/A 05/06/2017   Procedure: LAPAROSCOPIC CHOLECYSTECTOMY;  Surgeon: Ancil Linseyavis, Jason Evan, MD;  Location: [facility] ORS;  Service: General;  Laterality: N/A;  . TONSILLECTOMY    . VASECTOMY  Jan 2015   Family History  Problem Relation Age of Onset  . Diabetes Sister        type 1  . Diabetes Mother   . Dementia Maternal Grandmother   . AAA (abdominal aortic aneurysm) Maternal Grandfather   . Hypertension Maternal Grandfather    Social History   Tobacco Use  . Smoking status: Former Smoker    Packs/day: 1.00    Years: 25.00    Pack years: 25.00    Types: Cigarettes    Last attempt to quit: 02/08/2015    Years since quitting: 3.1  . Smokeless tobacco: Never Used  Substance Use Topics  . Alcohol use: Yes    Alcohol/week: 3.0 standard drinks    Types: 2 Cans of beer, 1 Shots of liquor per week    Comment: 1-4  drinks/week  . Drug use: No     [facility] Visit from 04/07/2018 in [facility]  AUDIT-C Score  0      Interim medical history since last visit reviewed. Allergies and medications reviewed  Review of Systems Per HPI unless specifically indicated above     Objective:    BP 122/74   Pulse 95   Temp 97.8 F (36.6 C) (Oral)   Ht 6\' 2"  (1.88 m)   Wt 260 lb 6.4 oz (118.1 kg)   SpO2 97%   BMI 33.43 kg/m   Wt Readings from Last 3 Encounters:  04/07/18 260 lb 6.4 oz (118.1 kg)  01/04/18 257 lb 8 oz (116.8 kg)  05/20/17 264 lb (119.7 kg)    Physical Exam Constitutional:      General: He is not in acute distress.    Appearance: He is well-developed.  HENT:     Head: Normocephalic and atraumatic.  Eyes:     General: No scleral icterus. Neck:     Thyroid: No thyromegaly.  Cardiovascular:     Rate and Rhythm: Normal rate and regular rhythm.  Pulmonary:     Effort: Pulmonary effort is normal.     Breath sounds: Normal breath sounds.  Abdominal:     General: Bowel sounds are normal. There is no distension.     Palpations: Abdomen is soft.  Skin:    General: Skin is warm and dry.     Coloration: Skin is not pale.  Neurological:     Coordination: Coordination normal.  Psychiatric:        Behavior: Behavior normal.        Thought Content: Thought content normal.        Judgment: Judgment normal.    Diabetic Foot Form - Detailed   Diabetic Foot Exam - detailed Diabetic Foot exam was performed with the following findings:  Yes 04/07/2018 12:22 PM  Visual Foot Exam completed.:  Yes  Pulse Foot Exam completed.:  Yes  Right Dorsalis Pedis:  Present Left Dorsalis Pedis:  Present  Sensory Foot Exam Completed.:  Yes Semmes-Weinstein Monofilament Test R Site 1-Great Toe:  Pos L Site 1-Great Toe:  Pos         Results for orders placed or performed in visit on 01/18/18  Basic Metabolic Panel (BMET)  Result Value Ref Range   Glucose, Bld 166 (H)  65 - 139  mg/dL   BUN 17 7 - 25 mg/dL   Creat 3.08 6.57 - 8.46 mg/dL   BUN/Creatinine Ratio NOT APPLICABLE 6 - 22 (calc)   Sodium 137 135 - 146 mmol/L   Potassium 4.1 3.5 - 5.3 mmol/L   Chloride 103 98 - 110 mmol/L   CO2 27 20 - 32 mmol/L   Calcium 9.3 8.6 - 10.3 mg/dL      Assessment & Plan:   Problem List Items Addressed This Visit      Cardiovascular and Mediastinum   Hypertension (Chronic)    Controlled today      Abdominal aortic atherosclerosis (HCC) (Chronic)    Try to limit egg yolks to no more than 3 per week; limiting fried foods, try to limit saturated fats; goal LDL is less than 70        Endocrine   Type 2 diabetes mellitus, uncontrolled (HCC) - Primary (Chronic)    Managed by endo; hopefully his next A1c will show his efforts and be down; foot exam by MD today; he'll get eye exam done soon; working on weight loss      Relevant Medications   empagliflozin (JARDIANCE) 25 MG TABS tablet   Other Relevant Orders   Ambulatory referral to Ophthalmology   Hemoglobin A1c   Lipid panel     Musculoskeletal and Integument   Rheumatoid arthritis, 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HCC)    Doing much better, asymptomatic; seeing rheumatologist        Genitourinary   CKD (chronic kidney disease) (Chronic)    Limiting NSAIDs        Other   Obesity (Chronic)    Encouraged weight loss; pt politely declined referral to nutritionist but he is welcome to call for referral if needed      Relevant Medications   empagliflozin (JARDIANCE) 25 MG TABS tablet   Hyperlipidemia (Chronic)    Encouraged limiting saturated fats and weight loss      Relevant Orders   Lipid panel       Follow up plan: Return in about 3 months (around 07/07/2018) for follow-up visit with Dr. Sherie Don (or just after).  An after-visit summary was printed and given to the patient at check-out.  Please see the patient instructions which may contain other information and recommendations beyond what is mentioned above in the  assessment and plan.  No orders of the defined types were placed in this encounter.   Orders Placed This Encounter  Procedures  . Hemoglobin A1c  . Lipid panel  . Ambulatory referral to Ophthalmology

## 2018-04-07 NOTE — Assessment & Plan Note (Signed)
Encouraged weight loss; pt politely declined referral to nutritionist but he is welcome to call for referral if needed

## 2018-04-07 NOTE — Assessment & Plan Note (Signed)
Controlled today 

## 2018-04-07 NOTE — Assessment & Plan Note (Signed)
Try to limit egg yolks to no more than 3 per week; limiting fried foods, try to limit saturated fats; goal LDL is less than 70

## 2018-04-07 NOTE — Assessment & Plan Note (Signed)
Managed by endo; hopefully his next A1c will show his efforts and be down; foot exam by MD today; he'll get eye exam done soon; working on weight loss

## 2018-04-07 NOTE — Assessment & Plan Note (Signed)
Doing much better, asymptomatic; seeing rheumatologist

## 2018-04-07 NOTE — Patient Instructions (Addendum)
Please make sure to see your eye doctor soon  Check out the information at familydoctor.org entitled "Nutrition for Weight Loss: What You Need to Know about Fad Diets" Try to lose between 1-2 pounds per week by taking in fewer calories and burning off more calories You can succeed by limiting portions, limiting foods dense in calories and fat, becoming more active, and drinking 8 glasses of water a day (64 ounces) Don't skip meals, especially breakfast, as skipping meals may alter your metabolism Do not use over-the-counter weight loss pills or gimmicks that claim rapid weight loss A healthy BMI (or body mass index) is between 18.5 and 24.9 You can calculate your ideal BMI at the NIH website JobEconomics.huhttp://www.nhlbi.nih.gov/health/educational/lose_wt/BMI/bmicalc.htm   Obesity, Adult Obesity is the condition of having too much total body fat. Being overweight or obese means that your weight is greater than what is considered healthy for your body size. Obesity is determined by a measurement called BMI. BMI is an estimate of body fat and is calculated from height and weight. For adults, a BMI of 30 or higher is considered obese. Obesity can eventually lead to other health concerns and major illnesses, including:  Stroke.  Coronary artery disease (CAD).  Type 2 diabetes.  Some types of cancer, including cancers of the colon, breast, uterus, and gallbladder.  Osteoarthritis.  High blood pressure (hypertension).  High cholesterol.  Sleep apnea.  Gallbladder stones.  Infertility problems. What are the causes? The main cause of obesity is taking in (consuming) more calories than your body uses for energy. Other factors that contribute to this condition may include:  Being born with genes that make you more likely to become obese.  Having a medical condition that causes obesity. These conditions include: ? Hypothyroidism. ? Polycystic ovarian syndrome (PCOS). ? Binge-eating  disorder. ? Cushing syndrome.  Taking certain medicines, such as steroids, antidepressants, and seizure medicines.  Not being physically active (sedentary lifestyle).  Living where there are limited places to exercise safely or buy healthy foods.  Not getting enough sleep. What increases the risk? The following factors may increase your risk of this condition:  Having a family history of obesity.  Being a woman of African-American descent.  Being a man of Hispanic descent. What are the signs or symptoms? Having excessive body fat is the main symptom of this condition. How is this diagnosed? This condition may be diagnosed based on:  Your symptoms.  Your medical history.  A physical exam. Your health care provider may measure: ? Your BMI. If you are an adult with a BMI between 25 and less than 30, you are considered overweight. If you are an adult with a BMI of 30 or higher, you are considered obese. ? The distances around your hips and your waist (circumferences). These may be compared to each other to help diagnose your condition. ? Your skinfold thickness. Your health care provider may gently pinch a fold of your skin and measure it. How is this treated? Treatment for this condition often includes changing your lifestyle. Treatment may include some or all of the following:  Dietary changes. Work with your health care provider and a dietitian to set a weight-loss goal that is healthy and reasonable for you. Dietary changes may include eating: ? Smaller portions. A portion size is the amount of a particular food that is healthy for you to eat at one time. This varies from person to person. ? Low-calorie or low-fat options. ? More whole grains, fruits, and vegetables.  Regular physical activity. This may include aerobic activity (cardio) and strength training.  Medicine to help you lose weight. Your health care provider may prescribe medicine if you are unable to lose 1 pound  a week after 6 weeks of eating more healthily and doing more physical activity.  Surgery. Surgical options may include gastric banding and gastric bypass. Surgery may be done if: ? Other treatments have not helped to improve your condition. ? You have a BMI of 40 or higher. ? You have life-threatening health problems related to obesity. Follow these instructions at home:  Eating and drinking   Follow recommendations from your health care provider about what you eat and drink. Your health care provider may advise you to: ? Limit fast foods, sweets, and processed snack foods. ? Choose low-fat options, such as low-fat milk instead of whole milk. ? Eat 5 or more servings of fruits or vegetables every day. ? Eat at home more often. This gives you more control over what you eat. ? Choose healthy foods when you eat out. ? Learn what a healthy portion size is. ? Keep low-fat snacks on hand. ? Avoid sugary drinks, such as soda, fruit juice, iced tea sweetened with sugar, and flavored milk. ? Eat a healthy breakfast.  Drink enough water to keep your urine clear or pale yellow.  Do not go without eating for long periods of time (do not fast) or follow a fad diet. Fasting and fad diets can be unhealthy and even dangerous. Physical Activity  Exercise regularly, as told by your health care provider. Ask your health care provider what types of exercise are safe for you and how often you should exercise.  Warm up and stretch before being active.  Cool down and stretch after being active.  Rest between periods of activity. Lifestyle  Limit the time that you spend in front of your TV, computer, or video game system.  Find ways to reward yourself that do not involve food.  Limit alcohol intake to no more than 1 drink a day for nonpregnant women and 2 drinks a day for men. One drink equals 12 oz of beer, 5 oz of wine, or 1 oz of hard liquor. General instructions  Keep a weight loss journal to  keep track of the food you eat and how much you exercise you get.  Take over-the-counter and prescription medicines only as told by your health care provider.  Take vitamins and supplements only as told by your health care provider.  Consider joining a support group. Your health care provider may be able to recommend a support group.  Keep all follow-up visits as told by your health care provider. This is important. Contact a health care provider if:  You are unable to meet your weight loss goal after 6 weeks of dietary and lifestyle changes. This information is not intended to replace advice given to you by your health care provider. Make sure you discuss any questions you have with your health care provider. Document Released: 04/22/2004 Document Revised: 08/18/2015 Document Reviewed: 01/01/2015 Elsevier Interactive Patient Education  2019 Elsevier Inc.  Preventing Unhealthy [employer], Adult Staying at a healthy weight is important to your overall health. When fat builds up in your body, you may become overweight or obese. Being overweight or obese increases your risk of developing certain health problems, such as heart disease, diabetes, sleeping problems, joint problems, and some types of cancer. Unhealthy weight gain is often the result of making unhealthy food  choices or not getting enough exercise. You can make changes to your lifestyle to prevent obesity and stay as healthy as possible. What nutrition changes can be made?   Eat only as much as your body needs. To do this: ? Pay attention to signs that you are hungry or full. Stop eating as soon as you feel full. ? If you feel hungry, try drinking water first before eating. Drink enough water so your urine is clear or pale yellow. ? Eat smaller portions. Pay attention to portion sizes when eating out. ? Look at serving sizes on food labels. Most foods contain more than one serving per container. ? Eat the recommended number of  calories for your gender and activity level. For most active people, a daily total of 2,000 calories is appropriate. If you are trying to lose weight or are not very active, you may need to eat fewer calories. Talk with your health care provider or a diet and nutrition specialist (dietitian) about how many calories you need each day.  Choose healthy foods, such as: ? Fruits and vegetables. At each meal, try to fill at least half of your plate with fruits and vegetables. ? Whole grains, such as whole-wheat bread, Duma rice, and quinoa. ? Lean meats, such as chicken or fish. ? Other healthy proteins, such as beans, eggs, or tofu. ? Healthy fats, such as nuts, seeds, fatty fish, and olive oil. ? Low-fat or fat-free dairy products.  Check food labels, and avoid food and drinks that: ? Are high in calories. ? Have added sugar. ? Are high in sodium. ? Have saturated fats or trans fats.  Cook foods in healthier ways, such as by baking, broiling, or grilling.  Make a meal plan for the week, and shop with a grocery list to help you stay on track with your purchases. Try to avoid going to the grocery store when you are hungry.  When grocery shopping, try to shop around the outside of the store first, where the fresh foods are. Doing this helps you to avoid prepackaged foods, which can be high in sugar, salt (sodium), and fat. What lifestyle changes can be made?   Exercise for 30 or more minutes on 5 or more days each week. Exercising may include brisk walking, yard work, biking, running, swimming, and team sports like basketball and soccer. Ask your health care provider which exercises are safe for you.  Do muscle-strengthening activities, such as lifting weights or using resistance bands, on 2 or more days a week.  Do not use any products that contain nicotine or tobacco, such as cigarettes and e-cigarettes. If you need help quitting, ask your health care provider.  Limit alcohol intake to no  more than 1 drink a day for nonpregnant women and 2 drinks a day for men. One drink equals 12 oz of beer, 5 oz of wine, or 1 oz of hard liquor.  Try to get 7-9 hours of sleep each night. What other changes can be made?  Keep a food and activity journal to keep track of: ? What you ate and how many calories you had. Remember to count the calories in sauces, dressings, and side dishes. ? Whether you were active, and what exercises you did. ? Your calorie, weight, and activity goals.  Check your weight regularly. Track any changes. If you notice you have gained weight, make changes to your diet or activity routine.  Avoid taking weight-loss medicines or supplements. Talk to your health care  provider before starting any new medicine or supplement.  Talk to your health care provider before trying any new diet or exercise plan. Why are these changes important? Eating healthy, staying active, and having healthy habits can help you to prevent obesity. Those changes also:  Help you manage stress and emotions.  Help you connect with friends and family.  Improve your self-esteem.  Improve your sleep.  Prevent long-term health problems. What can happen if changes are not made? Being obese or overweight can cause you to develop joint or bone problems, which can make it hard for you to stay active or do activities you enjoy. Being obese or overweight also puts stress on your heart and lungs and can lead to health problems like diabetes, heart disease, and some cancers. Where to find more information Talk with your health care provider or a dietitian about healthy eating and healthy lifestyle choices. You may also find information from:  U.S. Department of Agriculture, MyPlate: https://ball-collins.biz/www.choosemyplate.gov  American Heart Association: www.heart.org  Centers for Disease Control and Prevention: FootballExhibition.com.brwww.cdc.gov Summary  Staying at a healthy weight is important to your overall health. It helps you to  prevent certain diseases and health problems, such as heart disease, diabetes, joint problems, sleep disorders, and some types of cancer.  Being obese or overweight can cause you to develop joint or bone problems, which can make it hard for you to stay active or do activities you enjoy.  You can prevent unhealthy weight gain by eating a healthy diet, exercising regularly, not smoking, limiting alcohol, and getting enough sleep.  Talk with your health care provider or a dietitian for guidance about healthy eating and healthy lifestyle choices. This information is not intended to replace advice given to you by your health care provider. Make sure you discuss any questions you have with your health care provider. Document Released: 03/16/2016 Document Revised: 12/24/2016 Document Reviewed: 04/21/2016 Elsevier Interactive Patient Education  2019 [employer]  Try to pick healthier snacks and limit portions Drink plenty of water

## 2018-04-08 LAB — HEMOGLOBIN A1C
Hgb A1c MFr Bld: 9.4 % of total Hgb — ABNORMAL HIGH (ref <5.7)
Mean Plasma Glucose: 223 (calc)
eAG (mmol/L): 12.4 (calc)

## 2018-04-08 LAB — LIPID PANEL
Cholesterol: 132 mg/dL (ref <200)
HDL: 28 mg/dL — ABNORMAL LOW (ref >40)
LDL Cholesterol (Calc): 64 mg/dL (calc)
Non-HDL Cholesterol (Calc): 104 mg/dL (calc) (ref <130)
Total CHOL/HDL Ratio: 4.7 (calc) (ref <5.0)
Triglycerides: 329 mg/dL — ABNORMAL HIGH (ref <150)

## 2018-05-01 DIAGNOSIS — Z794 Long term (current) use of insulin: Secondary | ICD-10-CM | POA: Insufficient documentation

## 2018-05-01 DIAGNOSIS — E1159 Type 2 diabetes mellitus with other circulatory complications: Secondary | ICD-10-CM | POA: Insufficient documentation

## 2018-05-01 DIAGNOSIS — I152 Hypertension secondary to endocrine disorders: Secondary | ICD-10-CM | POA: Insufficient documentation

## 2018-05-01 DIAGNOSIS — E1165 Type 2 diabetes mellitus with hyperglycemia: Secondary | ICD-10-CM | POA: Insufficient documentation

## 2018-05-01 DIAGNOSIS — E1169 Type 2 diabetes mellitus with other specified complication: Secondary | ICD-10-CM | POA: Insufficient documentation

## 2018-05-10 LAB — HM DIABETES EYE EXAM

## 2018-06-12 ENCOUNTER — Other Ambulatory Visit: Payer: Self-pay | Admitting: Family Medicine

## 2018-07-07 ENCOUNTER — Other Ambulatory Visit: Payer: Self-pay

## 2018-07-07 ENCOUNTER — Telehealth: Payer: Self-pay | Admitting: Family Medicine

## 2018-07-07 ENCOUNTER — Ambulatory Visit (INDEPENDENT_AMBULATORY_CARE_PROVIDER_SITE_OTHER): Payer: BLUE CROSS/BLUE SHIELD | Admitting: Family Medicine

## 2018-07-07 ENCOUNTER — Encounter: Payer: Self-pay | Admitting: Family Medicine

## 2018-07-07 VITALS — Temp 98.6°F | Ht 74.0 in | Wt 254.0 lb

## 2018-07-07 DIAGNOSIS — Z6833 Body mass index (BMI) 33.0-33.9, adult: Secondary | ICD-10-CM

## 2018-07-07 DIAGNOSIS — I129 Hypertensive chronic kidney disease with stage 1 through stage 4 chronic kidney disease, or unspecified chronic kidney disease: Secondary | ICD-10-CM | POA: Diagnosis not present

## 2018-07-07 DIAGNOSIS — E1165 Type 2 diabetes mellitus with hyperglycemia: Secondary | ICD-10-CM | POA: Diagnosis not present

## 2018-07-07 DIAGNOSIS — E1122 Type 2 diabetes mellitus with diabetic chronic kidney disease: Secondary | ICD-10-CM | POA: Diagnosis not present

## 2018-07-07 DIAGNOSIS — E782 Mixed hyperlipidemia: Secondary | ICD-10-CM

## 2018-07-07 DIAGNOSIS — E559 Vitamin D deficiency, unspecified: Secondary | ICD-10-CM

## 2018-07-07 DIAGNOSIS — E6609 Other obesity due to excess calories: Secondary | ICD-10-CM

## 2018-07-07 DIAGNOSIS — N181 Chronic kidney disease, stage 1: Secondary | ICD-10-CM

## 2018-07-07 DIAGNOSIS — Z5181 Encounter for therapeutic drug level monitoring: Secondary | ICD-10-CM

## 2018-07-07 DIAGNOSIS — I10 Essential (primary) hypertension: Secondary | ICD-10-CM

## 2018-07-07 MED ORDER — ATORVASTATIN CALCIUM 20 MG PO TABS: 20.0000 mg | ORAL_TABLET | Freq: Every day | ORAL | 1 refills | Status: DC

## 2018-07-07 NOTE — Assessment & Plan Note (Signed)
Check liver and kidneys 

## 2018-07-07 NOTE — Assessment & Plan Note (Signed)
Little weight loss every week and that will add up

## 2018-07-07 NOTE — Assessment & Plan Note (Signed)
Check lipids in June or July; avoid fried foods, cheese, eggs; plenty of veggies and fruits

## 2018-07-07 NOTE — Assessment & Plan Note (Addendum)
Check FSBS 3x a day; check feet regularly, notify me of any infections, concerns; eye visits yearly; high risk of COVID-19 complications, social distancing, hand washing encouraged

## 2018-07-07 NOTE — Assessment & Plan Note (Signed)
Controlled; limit salt; he is working on weight loss

## 2018-07-07 NOTE — Progress Notes (Signed)
Temp 98.6 F (37 C) (Oral)   Ht  (1.88 m)   Wt 254 lb (115.2 kg)   BMI 32.61 kg/m    Subjective:    Patient ID: Nathan Rojas, male    DOB: 12-13-1975, 43 y.o.   MRN: [id number]  HPI: Nathan Rojas is a 43 y.o. male  Chief Complaint  Patient presents with  . Medication Refill    HPI Virtual Visit via Telephone/Video Note   I connected with the patient via: telephone  I verified that I am speaking with the correct person using two identifiers.  Call started: 0931 hours Call terminated: 0947 hours  Total length of call: 16 minutes and 31 seconds   I discussed the limitations, risks, security, and privacy concerns of performing an evaluation and management service by telephone and the availability of in-person appointments. Staff discussed with the patient that he/she may be responsible for charges related to this service. The patient expressed understanding and agreed to proceed.  Patient location: home Provider location: home office Additional participants: all alone  Type 2 diabetes mellitus; he sees endocrinologist for management; last visit with endo was May 01, 2018; reviewed her note; instructed to check FSBS 3x a day; continue lantus 20 units daily; continue humulin R 20 units before meals; continue Jardiance 25 mg daily Next visit there in May Avoiding regular drinks, drinking diet Mt. Dew His sugars are averaging 130-160 Using insulin No low sugars  Lab Results  Component Value Date   HGBA1C 9.4 (H) 04/07/2018   Lab Results  Component Value Date   MICROALBUR 1.1 01/04/2018   High cholesterol; total 132, HDL 28, TG 329, LDL 64 in January 2020 Trying to eat better; wife and patient are meal planning; staying off of fried foods; not big on sandwich meats; changed to deer burger instead of cow mostly, 93% lean; plenty of fruits and veggies  Rheumatoid arthritis; he has not had any problems with it really; occasionally stiff hands, but doing real good  for over a year; thinks his mother may have a little; father has RA in his hands, but not deforming  Vit D deficiency  Obesity; goal weight 200 pounds; he is not doing too good, down to 254 pounds this morning; gyms are closed right now; he joined the gym and they closed it down a week shut down  CKD stage 1; creatinine has come down from 0.97 to 0.74 most recently, though; limiting NSAIDS, very rarely takes the Aleve (for RA), maybe once a week  Lab Results  Component Value Date   CREATININE 0.74 01/18/2018   HTN; checking BP at the pharmacy; running normal, good blood pressure readings  Depression screen [facility] 2/9 07/07/2018 04/07/2018 01/04/2018 04/27/2017 04/01/2017  Decreased Interest 0 0 0 0 0  Down, Depressed, Hopeless 0 0 0 0 0  PHQ - 2 Score 0 0 0 0 0  Altered sleeping 0 0 0 - -  Tired, decreased energy 0 0 0 - -  Change in appetite 0 0 0 - -  Feeling bad or failure about yourself  0 0 0 - -  Trouble concentrating 0 0 0 - -  Moving slowly or fidgety/restless 0 0 0 - -  Suicidal thoughts 0 0 0 - -  PHQ-9 Score 0 0 0 - -  Difficult doing work/chores Not difficult at all Not difficult at all Not difficult at all - -   Fall Risk  07/07/2018 04/07/2018 01/04/2018 04/27/2017 04/22/2017  Falls in the past year? 0 0 No No No  Number falls in past yr: 0 0 - - -  Injury with Fall? 0 0 - - -    Relevant past medical, surgical, family and social history reviewed Past Medical History:  Diagnosis Date  . CKD (chronic kidney disease)   . Hyperlipidemia   . Hypertension   . MRSA infection around age 5   left hand wound  . Rheumatoid arthritis, 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HCC) 01/23/2016  . Trigeminal neuralgia 06/03/2015  . Type 2 diabetes mellitus, uncontrolled (HCC)    Past Surgical History:  Procedure Laterality Date  . APPENDECTOMY    . CHOLECYSTECTOMY N/A 05/06/2017   Procedure: LAPAROSCOPIC CHOLECYSTECTOMY;  Surgeon: Ancil Linsey, MD;  Location: [facility] ORS;  Service: General;  Laterality: N/A;   . TONSILLECTOMY    . VASECTOMY  Jan 2015   Family History  Problem Relation Age of Onset  . Diabetes Sister        type 1  . Diabetes Mother   . Dementia Maternal Grandmother   . AAA (abdominal aortic aneurysm) Maternal Grandfather   . Hypertension Maternal Grandfather    Social History   Tobacco Use  . Smoking status: Former Smoker    Packs/day: 1.00    Years: 25.00    Pack years: 25.00    Types: Cigarettes    Start date: 03/29/1989    Last attempt to quit: 02/08/2015    Years since quitting: 3.4  . Smokeless tobacco: Never Used  Substance Use Topics  . Alcohol use: Yes    Alcohol/week: 2.0 standard drinks    Types: 1 Shots of liquor, 1 Cans of beer per week    Comment: 1-2 drinks/week  . Drug use: No     [facility] Visit from 07/07/2018 in [facility]  AUDIT-C Score  4      Interim medical history since last visit reviewed. Allergies and medications reviewed  Review of Systems Per HPI unless specifically indicated above     Objective:    Temp 98.6 F (37 C) (Oral)   Ht 6\' 2"  (1.88 m)   Wt 254 lb (115.2 kg)   BMI 32.61 kg/m   Wt Readings from Last 3 Encounters:  07/07/18 254 lb (115.2 kg)  04/07/18 260 lb 6.4 oz (118.1 kg)  01/04/18 257 lb 8 oz (116.8 kg)    Physical Exam Pulmonary:     Effort: No respiratory distress.  Neurological:     Mental Status: He is alert.  Psychiatric:        Speech: Speech is not rapid and pressured, delayed or slurred.    Results for orders placed or performed in visit on 05/11/18  HM DIABETES EYE EXAM  Result Value Ref Range   HM Diabetic Eye Exam No Retinopathy No Retinopathy      Assessment & Plan:   Problem List Items Addressed This Visit      Cardiovascular and Mediastinum   Hypertension (Chronic)    Controlled; limit salt; he is working on weight loss      Relevant Medications   atorvastatin (LIPITOR) 20 MG tablet     Endocrine   Type 2 diabetes mellitus, uncontrolled (HCC) - Primary  (Chronic)    Check FSBS 3x a day; check feet regularly, notify me of any infections, concerns; eye visits yearly; high risk of COVID-19 complications, social distancing, hand washing encouraged      Relevant Medications   atorvastatin (LIPITOR) 20 MG tablet  Other Relevant Orders   Microalbumin / creatinine urine ratio   Lipid panel   Hemoglobin A1c     Other   Vitamin D deficiency   Relevant Orders   VITAMIN D 25 Hydroxy (Vit-D Deficiency, Fractures)   Obesity (Chronic)    Little weight loss every week and that will add up      Medication monitoring encounter    Check liver and kidneys      Relevant Orders   COMPLETE METABOLIC PANEL WITH GFR   Hyperlipidemia (Chronic)    Check lipids in June or July; avoid fried foods, cheese, eggs; plenty of veggies and fruits      Relevant Medications   atorvastatin (LIPITOR) 20 MG tablet   Other Relevant Orders   Lipid panel       Follow up plan: Return in about 5 months (around 12/07/2018) for follow-up visit with Dr. Sherie DonLada; labs in June.   Meds ordered this encounter  Medications  . atorvastatin (LIPITOR) 20 MG tablet    Sig: Take 1 tablet (20 mg total) by mouth at bedtime.    Dispense:  90 tablet    Refill:  1    Orders Placed This Encounter  Procedures  . Microalbumin / creatinine urine ratio  . Lipid panel  . Hemoglobin A1c  . COMPLETE METABOLIC PANEL WITH GFR  . VITAMIN D 25 Hydroxy (Vit-D Deficiency, Fractures)

## 2018-07-07 NOTE — Telephone Encounter (Signed)
done

## 2018-07-07 NOTE — Telephone Encounter (Signed)
Please schedule patient with Dr. Sherie Don for follow-up in 4-5 months Dx: HTN, high chol, diabetes

## 2018-11-24 ENCOUNTER — Other Ambulatory Visit: Payer: Self-pay

## 2018-11-24 DIAGNOSIS — Z20822 Contact with and (suspected) exposure to covid-19: Secondary | ICD-10-CM

## 2018-11-25 LAB — NOVEL CORONAVIRUS, NAA: SARS-CoV-2, NAA: NOT DETECTED

## 2018-12-11 ENCOUNTER — Ambulatory Visit: Payer: BLUE CROSS/BLUE SHIELD | Admitting: Family Medicine

## 2018-12-12 ENCOUNTER — Other Ambulatory Visit: Payer: Self-pay

## 2018-12-12 ENCOUNTER — Ambulatory Visit (INDEPENDENT_AMBULATORY_CARE_PROVIDER_SITE_OTHER): Payer: BC Managed Care – PPO | Admitting: Family Medicine

## 2018-12-12 ENCOUNTER — Encounter: Payer: Self-pay | Admitting: Family Medicine

## 2018-12-12 VITALS — Temp 97.7°F

## 2018-12-12 DIAGNOSIS — I129 Hypertensive chronic kidney disease with stage 1 through stage 4 chronic kidney disease, or unspecified chronic kidney disease: Secondary | ICD-10-CM

## 2018-12-12 DIAGNOSIS — E1165 Type 2 diabetes mellitus with hyperglycemia: Secondary | ICD-10-CM

## 2018-12-12 DIAGNOSIS — I10 Essential (primary) hypertension: Secondary | ICD-10-CM

## 2018-12-12 DIAGNOSIS — E782 Mixed hyperlipidemia: Secondary | ICD-10-CM

## 2018-12-12 DIAGNOSIS — N529 Male erectile dysfunction, unspecified: Secondary | ICD-10-CM | POA: Diagnosis not present

## 2018-12-12 DIAGNOSIS — N182 Chronic kidney disease, stage 2 (mild): Secondary | ICD-10-CM

## 2018-12-12 DIAGNOSIS — Z5181 Encounter for therapeutic drug level monitoring: Secondary | ICD-10-CM

## 2018-12-12 DIAGNOSIS — I700 Atherosclerosis of aorta: Secondary | ICD-10-CM

## 2018-12-12 DIAGNOSIS — Z794 Long term (current) use of insulin: Secondary | ICD-10-CM

## 2018-12-12 DIAGNOSIS — IMO0002 Reserved for concepts with insufficient information to code with codable children: Secondary | ICD-10-CM

## 2018-12-12 MED ORDER — ATORVASTATIN CALCIUM 20 MG PO TABS: 20.0000 mg | ORAL_TABLET | Freq: Every day | ORAL | 1 refills | Status: DC

## 2018-12-12 MED ORDER — SILDENAFIL CITRATE 100 MG PO TABS: 50.0000 mg | ORAL_TABLET | Freq: Every day | ORAL | 3 refills | Status: DC | PRN

## 2018-12-12 MED ORDER — LISINOPRIL 5 MG PO TABS: 5.0000 mg | ORAL_TABLET | Freq: Every day | ORAL | 1 refills | Status: DC

## 2018-12-12 NOTE — Progress Notes (Signed)
Name: Nathan Rojas   MRN: [id number]    DOB: [birth year]   Date:12/12/2018       Progress Note  Subjective:    Chief Complaint  Chief Complaint  Patient presents with   Follow-up   Hypertension   Hyperlipidemia    I connected with  Nathan Rojas  on 12/12/18 at  3:40 PM EDT by a video enabled telemedicine application and verified that I am speaking with the correct person using two identifiers.  I discussed the limitations of evaluation and management by telemedicine and the availability of in person appointments. The patient expressed understanding and agreed to proceed. Staff also discussed with the patient that there may be a patient responsible charge related to this service. Patient Location: home Provider [facility] Additional Individuals present: none  HPI  Pt due for routine f/up and is due for labs for his chronic conditions:  Hyperlipidemia: Current Medication Regimen: Last Lipids: Lab Results  Component Value Date   CHOL 132 04/07/2018   HDL 28 (L) 04/07/2018   LDLCALC 64 04/07/2018   TRIG 329 (H) 04/07/2018   CHOLHDL 4.7 04/07/2018   - Current Diet:  Eats whatever meats he wants, but has cut back on fast food, fried foods - Denies: Chest pain, shortness of breath, myalgias. - Documented aortic atherosclerosis? Yes - Risk factors for atherosclerosis: arteriosclerotic heart disease, diabetes mellitus, hypercholesterolemia and hypertension   Hypertension:  Currently managed on lisinopril 5 mg - mostly because of DM and for renal protection, pt states he is compliant with his meds, has no concerns or SE.  He never checks his BP at home but in office "its always good" BP Readings from Last 3 Encounters:  04/07/18 122/74  01/18/18 118/84  01/04/18 118/82   Pt denies CP, SOB, exertional sx, LE edema, palpitation, Ha's, visual disturbances, lightheadedness, hypotension, syncope. Dietary efforts for BP?  None - doesn't eat much salt, but no other  efforts   DM- IDDM, per endocrinology Solum: Per care everywhere records, last visit about a month ago with labs only A1c: Component Name 11/08/2018 08/02/2018   8.9 (H) 8.5 (H)  Hemoglobin A1C   ED: On viagra, is using with success.  He denies HA, sweats, near syncope, CP, priapism.  Would like a refill    Patient Active Problem List   Diagnosis Date Noted   Preventative health care 01/10/2018   Erectile dysfunction 04/27/2017   Abnormal liver diagnostic imaging 03/18/2017   Vitamin D deficiency 07/09/2016   Flexor tenosynovitis of thumb 05/27/2016   Encounter for long-term (current) use of high-risk medication 03/05/2016   Chronic pain of multiple joints 01/23/2016   CRP elevated 01/23/2016   Elevated rheumatoid factor 01/23/2016   Rheumatoid arthritis, 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HCC) 01/23/2016   Arthralgia of multiple joints 01/09/2016   Trigeminal neuralgia 06/03/2015   Medication monitoring encounter 04/18/2015   Obesity 04/18/2015   Kidney stone 11/22/2014   Abdominal aortic atherosclerosis (HCC) 11/16/2014   Type 2 diabetes mellitus, uncontrolled (HCC)    Hypertension    Hyperlipidemia    CKD (chronic kidney disease)    Encounter for sterilization 04/20/2013    Past Surgical History:  Procedure Laterality Date   APPENDECTOMY     CHOLECYSTECTOMY N/A 05/06/2017   Procedure: LAPAROSCOPIC CHOLECYSTECTOMY;  Surgeon: Ancil Linseyavis, Jason Evan, MD;  Location: [facility] ORS;  Service: General;  Laterality: N/A;   TONSILLECTOMY     VASECTOMY  Jan 2015    Family History  Problem Relation Age of  Onset   Diabetes Sister        type 1   Diabetes Mother    Dementia Maternal Grandmother    AAA (abdominal aortic aneurysm) Maternal Grandfather    Hypertension Maternal Grandfather     Social History   Socioeconomic History   Marital status: Married    Spouse name: Tammy   Number of children: 1   Years of education: 12   Highest education level: Associate  degree: academic program  Occupational History   Not on file  Social Needs   Financial resource strain: Not hard at all   Food insecurity    Worry: Never true    Inability: Never true   Transportation needs    Medical: No    Non-medical: No  Tobacco Use   Smoking status: Former Smoker    Packs/day: 1.00    Years: 25.00    Pack years: 25.00    Types: Cigarettes    Start date: 03/29/1989    Quit date: 02/08/2015    Years since quitting: 3.8   Smokeless tobacco: Never Used  Substance and Sexual Activity   Alcohol use: Yes    Alcohol/week: 2.0 standard drinks    Types: 1 Shots of liquor, 1 Cans of beer per week    Comment: 1-2 drinks/week   Drug use: No   Sexual activity: Yes    Partners: Female  Lifestyle   Physical activity    Days per week: 0 days    Minutes per session: 0 min   Stress: Not at all  Relationships   Social connections    Talks on phone: Three times a week    Gets together: Three times a week    Attends religious service: More than 4 times per year    Active member of club or organization: No    Attends meetings of clubs or organizations: Never    Relationship status: Married   Intimate partner violence    Fear of current or ex partner: No    Emotionally abused: No    Physically abused: No    Forced sexual activity: No  Other Topics Concern   Not on file  Social History Narrative   Not on file     Current Outpatient Medications:    aspirin EC 81 MG tablet, Take 81 mg by mouth daily., Disp: , Rfl:    atorvastatin (LIPITOR) 20 MG tablet, Take 1 tablet (20 mg total) by mouth at bedtime., Disp: 90 tablet, Rfl: 1   cholecalciferol (VITAMIN D) 1000 units tablet, Take 1 tablet (1,000 Units total) by mouth daily., Disp: , Rfl:    empagliflozin (JARDIANCE) 25 MG TABS tablet, Take 25 mg by mouth daily., Disp: , Rfl:    Icosapent Ethyl (VASCEPA) 1 g CAPS, TAKE 2 CAPSULES by mouth TWICE A DAY, Disp: 360 capsule, Rfl: 3   insulin glargine  (LANTUS) 100 UNIT/ML injection, Inject 0.1 mLs (10 Units total) into the skin daily. (Patient taking differently: Inject 20 Units into the skin daily. ), Disp: 10 mL, Rfl: 11   insulin regular (HUMULIN R) 100 units/mL injection, Inject 0.2-0.3 mLs (20-30 Units total) into the skin 3 (three) times daily before meals., Disp: 9 vial, Rfl: 3   lisinopril (PRINIVIL,ZESTRIL) 5 MG tablet, Take 1 tablet (5 mg total) by mouth daily., Disp: 90 tablet, Rfl: 3   Multiple Vitamin (MULTIVITAMIN WITH MINERALS) TABS tablet, Take 1 tablet by mouth daily., Disp: , Rfl:    sildenafil (VIAGRA) 100 MG tablet,  Take 0.5 tablets (50 mg total) by mouth daily as needed for erectile dysfunction. Do NOT take with nitrates, can be fatal, Disp: 5 tablet, Rfl: 3   naproxen sodium (ALEVE) 220 MG tablet, Take 220 mg by mouth daily as needed (pain). , Disp: , Rfl:   No Known Allergies  I personally reviewed active problem list, medication list, allergies, family history, social history, health maintenance, notes from last encounter, lab results with the patient/caregiver today.  Review of Systems  Constitutional: Negative.   HENT: Negative.   Eyes: Negative.   Respiratory: Negative.   Cardiovascular: Negative.   Gastrointestinal: Negative.   Endocrine: Negative.   Genitourinary: Negative.   Musculoskeletal: Negative.   Skin: Negative.   Allergic/Immunologic: Negative.   Neurological: Negative.   Hematological: Negative.   Psychiatric/Behavioral: Negative.   All other systems reviewed and are negative.     Objective:    Virtual encounter, vitals limited, only able to obtain the following Today's Vitals   12/12/18 1527  Temp: 97.7 F (36.5 C)   There is no height or weight on file to calculate BMI. Nursing Note and Vital Signs reviewed.  Physical Exam Vitals signs and nursing note reviewed.  Constitutional:      General: He is not in acute distress.    Appearance: Normal appearance. He is  well-developed and normal weight. He is not ill-appearing, toxic-appearing or diaphoretic.  HENT:     Head: Normocephalic and atraumatic.     Right Ear: External ear normal.     Left Ear: External ear normal.     Nose: Nose normal.  Eyes:     General: No scleral icterus.       Right eye: No discharge.        Left eye: No discharge.     Conjunctiva/sclera: Conjunctivae normal.  Neck:     Trachea: No tracheal deviation.  Cardiovascular:     Rate and Rhythm: Normal rate.  Pulmonary:     Effort: Pulmonary effort is normal. No respiratory distress.     Breath sounds: No stridor.  Musculoskeletal: Normal range of motion.  Skin:    Coloration: Skin is not jaundiced or pale.     Findings: No rash.  Neurological:     Mental Status: He is alert.     Motor: No weakness or abnormal muscle tone.     Coordination: Coordination normal.     Gait: Gait normal.  Psychiatric:        Mood and Affect: Mood normal.        Behavior: Behavior normal.     PE limited by telephone encounter  PHQ2/9: Depression screen [facility] 2/9 12/12/2018 07/07/2018 04/07/2018 01/04/2018 04/27/2017  Decreased Interest 0 0 0 0 0  Down, Depressed, Hopeless 0 0 0 0 0  PHQ - 2 Score 0 0 0 0 0  Altered sleeping 0 0 0 0 -  Tired, decreased energy 0 0 0 0 -  Change in appetite 0 0 0 0 -  Feeling bad or failure about yourself  0 0 0 0 -  Trouble concentrating 0 0 0 0 -  Moving slowly or fidgety/restless 0 0 0 0 -  Suicidal thoughts 0 0 0 0 -  PHQ-9 Score 0 0 0 0 -  Difficult doing work/chores Not difficult at all Not difficult at all Not difficult at all Not difficult at all -   PHQ-2/9 Result is negative.    Fall Risk: Fall Risk  12/12/2018 07/07/2018 04/07/2018 01/04/2018 04/27/2017  Falls in the past year? 1 0 0 No No  Number falls in past yr: 0 0 0 - -  Injury with Fall? 0 0 0 - -     Assessment and Plan:     ICD-10-CM   1. Mixed hyperlipidemia  E78.2 atorvastatin (LIPITOR) 20 MG tablet    COMPLETE METABOLIC  PANEL WITH GFR    Lipid panel   compliant with meds, no concerns or SE, due for labs  2. Essential hypertension  I10 lisinopril (ZESTRIL) 5 MG tablet    COMPLETE METABOLIC PANEL WITH GFR   compliant with meds, pt states well controlled but virtual encounter unable to check VS today, do for labs  3. Erectile dysfunction, unspecified erectile dysfunction type  N52.9 sildenafil (VIAGRA) 100 MG tablet   viagra working well w/o SE, refill entered reviewed concerning SE/sx  4. Stage 2 chronic kidney disease  N18.2 COMPLETE METABOLIC PANEL WITH GFR   recheck labs, on ACEI  5. Abdominal aortic atherosclerosis (HCC)  I70.0 CBC with Differential/Platelet    COMPLETE METABOLIC PANEL WITH GFR    Lipid panel   on statin and ASA, no claudication sx, no abd pain  6. Insulin dependent type 2 diabetes mellitus, uncontrolled (HCC)  E11.65 COMPLETE METABOLIC PANEL WITH GFR   Z79.4    per endocrinology - last A1C 8.9  7. Medication monitoring encounter  Z51.81 CBC with Differential/Platelet    COMPLETE METABOLIC PANEL WITH GFR    Lipid panel     Orders Placed This Encounter  Procedures   CBC with Differential/Platelet   COMPLETE METABOLIC PANEL WITH GFR   Lipid panel    Order Specific Question:   Has the patient fasted?    Answer:   Yes  Pt to return for fasting labs  Return in about 6 months (around 06/11/2019) for Routine follow-up, offer flu clinic, pt to come for fasting labs in next week.    I discussed the assessment and treatment plan with the patient. The patient was provided an opportunity to ask questions and all were answered. The patient agreed with the plan and demonstrated an understanding of the instructions.  The patient was advised to call back or seek an in-person evaluation if the symptoms worsen or if the condition fails to improve as anticipated.  I provided 15 minutes of non-face-to-face time during this encounter.  Danelle BerryLeisa Kumiko Fishman, PA-C 09/15/203:58 PM

## 2018-12-15 DIAGNOSIS — M545 Low back pain: Secondary | ICD-10-CM | POA: Diagnosis not present

## 2018-12-15 DIAGNOSIS — M25511 Pain in right shoulder: Secondary | ICD-10-CM | POA: Diagnosis not present

## 2018-12-15 DIAGNOSIS — M7541 Impingement syndrome of right shoulder: Secondary | ICD-10-CM | POA: Diagnosis not present

## 2018-12-15 DIAGNOSIS — M9903 Segmental and somatic dysfunction of lumbar region: Secondary | ICD-10-CM | POA: Diagnosis not present

## 2018-12-18 DIAGNOSIS — M9903 Segmental and somatic dysfunction of lumbar region: Secondary | ICD-10-CM | POA: Diagnosis not present

## 2018-12-18 DIAGNOSIS — M25511 Pain in right shoulder: Secondary | ICD-10-CM | POA: Diagnosis not present

## 2018-12-18 DIAGNOSIS — M545 Low back pain: Secondary | ICD-10-CM | POA: Diagnosis not present

## 2018-12-18 DIAGNOSIS — M7541 Impingement syndrome of right shoulder: Secondary | ICD-10-CM | POA: Diagnosis not present

## 2018-12-22 DIAGNOSIS — M25511 Pain in right shoulder: Secondary | ICD-10-CM | POA: Diagnosis not present

## 2018-12-22 DIAGNOSIS — M7541 Impingement syndrome of right shoulder: Secondary | ICD-10-CM | POA: Diagnosis not present

## 2018-12-22 DIAGNOSIS — M545 Low back pain: Secondary | ICD-10-CM | POA: Diagnosis not present

## 2018-12-22 DIAGNOSIS — M9903 Segmental and somatic dysfunction of lumbar region: Secondary | ICD-10-CM | POA: Diagnosis not present

## 2018-12-25 DIAGNOSIS — M7541 Impingement syndrome of right shoulder: Secondary | ICD-10-CM | POA: Diagnosis not present

## 2018-12-25 DIAGNOSIS — M545 Low back pain: Secondary | ICD-10-CM | POA: Diagnosis not present

## 2018-12-25 DIAGNOSIS — M25511 Pain in right shoulder: Secondary | ICD-10-CM | POA: Diagnosis not present

## 2018-12-25 DIAGNOSIS — M9903 Segmental and somatic dysfunction of lumbar region: Secondary | ICD-10-CM | POA: Diagnosis not present

## 2018-12-27 ENCOUNTER — Other Ambulatory Visit: Payer: Self-pay

## 2018-12-27 MED ORDER — VASCEPA 1 G PO CAPS: ORAL_CAPSULE | ORAL | 3 refills | Status: DC

## 2018-12-29 DIAGNOSIS — M7541 Impingement syndrome of right shoulder: Secondary | ICD-10-CM | POA: Diagnosis not present

## 2018-12-29 DIAGNOSIS — M25511 Pain in right shoulder: Secondary | ICD-10-CM | POA: Diagnosis not present

## 2018-12-29 DIAGNOSIS — M9903 Segmental and somatic dysfunction of lumbar region: Secondary | ICD-10-CM | POA: Diagnosis not present

## 2018-12-29 DIAGNOSIS — M545 Low back pain: Secondary | ICD-10-CM | POA: Diagnosis not present

## 2019-01-01 DIAGNOSIS — M25511 Pain in right shoulder: Secondary | ICD-10-CM | POA: Diagnosis not present

## 2019-01-01 DIAGNOSIS — M9903 Segmental and somatic dysfunction of lumbar region: Secondary | ICD-10-CM | POA: Diagnosis not present

## 2019-01-01 DIAGNOSIS — M545 Low back pain: Secondary | ICD-10-CM | POA: Diagnosis not present

## 2019-01-01 DIAGNOSIS — M7541 Impingement syndrome of right shoulder: Secondary | ICD-10-CM | POA: Diagnosis not present

## 2019-01-05 DIAGNOSIS — M7541 Impingement syndrome of right shoulder: Secondary | ICD-10-CM | POA: Diagnosis not present

## 2019-01-05 DIAGNOSIS — M545 Low back pain: Secondary | ICD-10-CM | POA: Diagnosis not present

## 2019-01-05 DIAGNOSIS — M9903 Segmental and somatic dysfunction of lumbar region: Secondary | ICD-10-CM | POA: Diagnosis not present

## 2019-01-05 DIAGNOSIS — M25511 Pain in right shoulder: Secondary | ICD-10-CM | POA: Diagnosis not present

## 2019-01-08 DIAGNOSIS — M7541 Impingement syndrome of right shoulder: Secondary | ICD-10-CM | POA: Diagnosis not present

## 2019-01-08 DIAGNOSIS — M545 Low back pain: Secondary | ICD-10-CM | POA: Diagnosis not present

## 2019-01-08 DIAGNOSIS — M25511 Pain in right shoulder: Secondary | ICD-10-CM | POA: Diagnosis not present

## 2019-01-08 DIAGNOSIS — M9903 Segmental and somatic dysfunction of lumbar region: Secondary | ICD-10-CM | POA: Diagnosis not present

## 2019-01-12 DIAGNOSIS — M7541 Impingement syndrome of right shoulder: Secondary | ICD-10-CM | POA: Diagnosis not present

## 2019-01-12 DIAGNOSIS — M25511 Pain in right shoulder: Secondary | ICD-10-CM | POA: Diagnosis not present

## 2019-01-12 DIAGNOSIS — M545 Low back pain: Secondary | ICD-10-CM | POA: Diagnosis not present

## 2019-01-12 DIAGNOSIS — M9903 Segmental and somatic dysfunction of lumbar region: Secondary | ICD-10-CM | POA: Diagnosis not present

## 2019-01-15 DIAGNOSIS — M7541 Impingement syndrome of right shoulder: Secondary | ICD-10-CM | POA: Diagnosis not present

## 2019-01-15 DIAGNOSIS — M9903 Segmental and somatic dysfunction of lumbar region: Secondary | ICD-10-CM | POA: Diagnosis not present

## 2019-01-15 DIAGNOSIS — M545 Low back pain: Secondary | ICD-10-CM | POA: Diagnosis not present

## 2019-01-15 DIAGNOSIS — M25511 Pain in right shoulder: Secondary | ICD-10-CM | POA: Diagnosis not present

## 2019-01-19 DIAGNOSIS — M7541 Impingement syndrome of right shoulder: Secondary | ICD-10-CM | POA: Diagnosis not present

## 2019-01-19 DIAGNOSIS — M25511 Pain in right shoulder: Secondary | ICD-10-CM | POA: Diagnosis not present

## 2019-01-19 DIAGNOSIS — M545 Low back pain: Secondary | ICD-10-CM | POA: Diagnosis not present

## 2019-01-19 DIAGNOSIS — M9903 Segmental and somatic dysfunction of lumbar region: Secondary | ICD-10-CM | POA: Diagnosis not present

## 2019-01-24 DIAGNOSIS — M9903 Segmental and somatic dysfunction of lumbar region: Secondary | ICD-10-CM | POA: Diagnosis not present

## 2019-01-24 DIAGNOSIS — M7541 Impingement syndrome of right shoulder: Secondary | ICD-10-CM | POA: Diagnosis not present

## 2019-01-24 DIAGNOSIS — M25511 Pain in right shoulder: Secondary | ICD-10-CM | POA: Diagnosis not present

## 2019-01-24 DIAGNOSIS — M545 Low back pain: Secondary | ICD-10-CM | POA: Diagnosis not present

## 2019-01-26 DIAGNOSIS — M25511 Pain in right shoulder: Secondary | ICD-10-CM | POA: Diagnosis not present

## 2019-01-26 DIAGNOSIS — M9903 Segmental and somatic dysfunction of lumbar region: Secondary | ICD-10-CM | POA: Diagnosis not present

## 2019-01-26 DIAGNOSIS — M545 Low back pain: Secondary | ICD-10-CM | POA: Diagnosis not present

## 2019-01-26 DIAGNOSIS — M7541 Impingement syndrome of right shoulder: Secondary | ICD-10-CM | POA: Diagnosis not present

## 2019-01-29 DIAGNOSIS — M25511 Pain in right shoulder: Secondary | ICD-10-CM | POA: Diagnosis not present

## 2019-01-29 DIAGNOSIS — M9903 Segmental and somatic dysfunction of lumbar region: Secondary | ICD-10-CM | POA: Diagnosis not present

## 2019-01-29 DIAGNOSIS — M545 Low back pain: Secondary | ICD-10-CM | POA: Diagnosis not present

## 2019-01-29 DIAGNOSIS — M7541 Impingement syndrome of right shoulder: Secondary | ICD-10-CM | POA: Diagnosis not present

## 2019-02-02 DIAGNOSIS — M545 Low back pain: Secondary | ICD-10-CM | POA: Diagnosis not present

## 2019-02-02 DIAGNOSIS — M25511 Pain in right shoulder: Secondary | ICD-10-CM | POA: Diagnosis not present

## 2019-02-02 DIAGNOSIS — M9903 Segmental and somatic dysfunction of lumbar region: Secondary | ICD-10-CM | POA: Diagnosis not present

## 2019-02-02 DIAGNOSIS — M7541 Impingement syndrome of right shoulder: Secondary | ICD-10-CM | POA: Diagnosis not present

## 2019-02-05 DIAGNOSIS — M545 Low back pain: Secondary | ICD-10-CM | POA: Diagnosis not present

## 2019-02-05 DIAGNOSIS — M7541 Impingement syndrome of right shoulder: Secondary | ICD-10-CM | POA: Diagnosis not present

## 2019-02-05 DIAGNOSIS — M25511 Pain in right shoulder: Secondary | ICD-10-CM | POA: Diagnosis not present

## 2019-02-05 DIAGNOSIS — M9903 Segmental and somatic dysfunction of lumbar region: Secondary | ICD-10-CM | POA: Diagnosis not present

## 2019-02-09 DIAGNOSIS — M7541 Impingement syndrome of right shoulder: Secondary | ICD-10-CM | POA: Diagnosis not present

## 2019-02-09 DIAGNOSIS — M545 Low back pain: Secondary | ICD-10-CM | POA: Diagnosis not present

## 2019-02-09 DIAGNOSIS — M25511 Pain in right shoulder: Secondary | ICD-10-CM | POA: Diagnosis not present

## 2019-02-09 DIAGNOSIS — M9903 Segmental and somatic dysfunction of lumbar region: Secondary | ICD-10-CM | POA: Diagnosis not present

## 2019-02-12 DIAGNOSIS — M9903 Segmental and somatic dysfunction of lumbar region: Secondary | ICD-10-CM | POA: Diagnosis not present

## 2019-02-12 DIAGNOSIS — M7541 Impingement syndrome of right shoulder: Secondary | ICD-10-CM | POA: Diagnosis not present

## 2019-02-12 DIAGNOSIS — M545 Low back pain: Secondary | ICD-10-CM | POA: Diagnosis not present

## 2019-02-12 DIAGNOSIS — M25511 Pain in right shoulder: Secondary | ICD-10-CM | POA: Diagnosis not present

## 2019-02-16 DIAGNOSIS — M9903 Segmental and somatic dysfunction of lumbar region: Secondary | ICD-10-CM | POA: Diagnosis not present

## 2019-02-16 DIAGNOSIS — M7541 Impingement syndrome of right shoulder: Secondary | ICD-10-CM | POA: Diagnosis not present

## 2019-02-16 DIAGNOSIS — M25511 Pain in right shoulder: Secondary | ICD-10-CM | POA: Diagnosis not present

## 2019-02-16 DIAGNOSIS — M545 Low back pain: Secondary | ICD-10-CM | POA: Diagnosis not present

## 2019-02-19 DIAGNOSIS — M9903 Segmental and somatic dysfunction of lumbar region: Secondary | ICD-10-CM | POA: Diagnosis not present

## 2019-02-19 DIAGNOSIS — M545 Low back pain: Secondary | ICD-10-CM | POA: Diagnosis not present

## 2019-02-19 DIAGNOSIS — M25511 Pain in right shoulder: Secondary | ICD-10-CM | POA: Diagnosis not present

## 2019-02-19 DIAGNOSIS — M7541 Impingement syndrome of right shoulder: Secondary | ICD-10-CM | POA: Diagnosis not present

## 2019-07-13 DIAGNOSIS — M654 Radial styloid tenosynovitis [de Quervain]: Secondary | ICD-10-CM | POA: Diagnosis not present

## 2019-08-26 IMAGING — US US ABDOMEN LIMITED
1 series · 14 of 25 positions shown · non-contrast
Comparison: Abdomen and pelvis CT dated 11/15/2014.

CLINICAL DATA: Right upper quadrant abdominal pain for the past 6
months appear

EXAM:
ULTRASOUND ABDOMEN LIMITED RIGHT UPPER QUADRANT

[Series 1: us abdomen limited · 0.28mm/px · 14 of 44 slices shown]
[im 1/44]
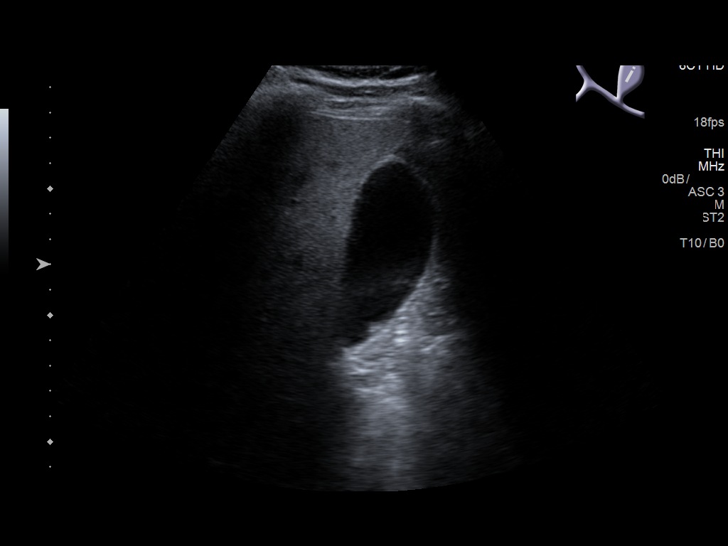
[im 4/44]
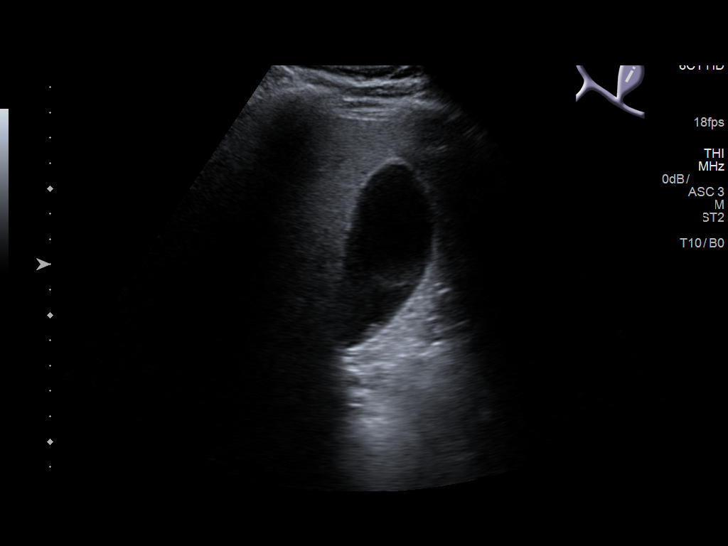
[im 8/44]
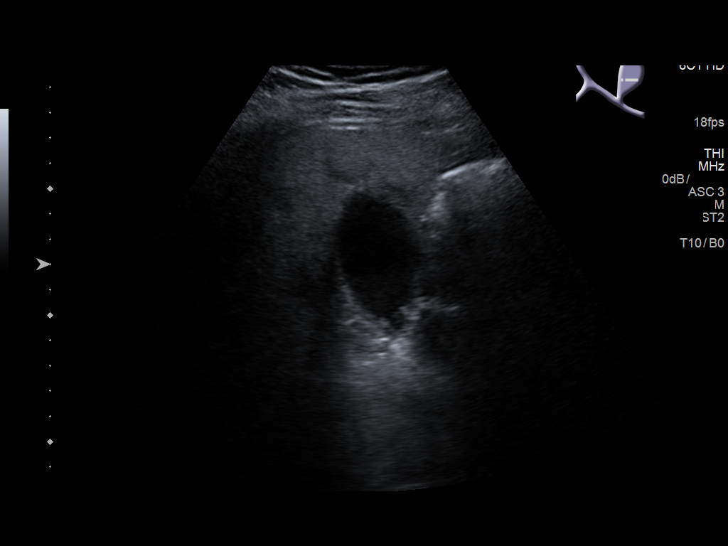
[im 11/44]
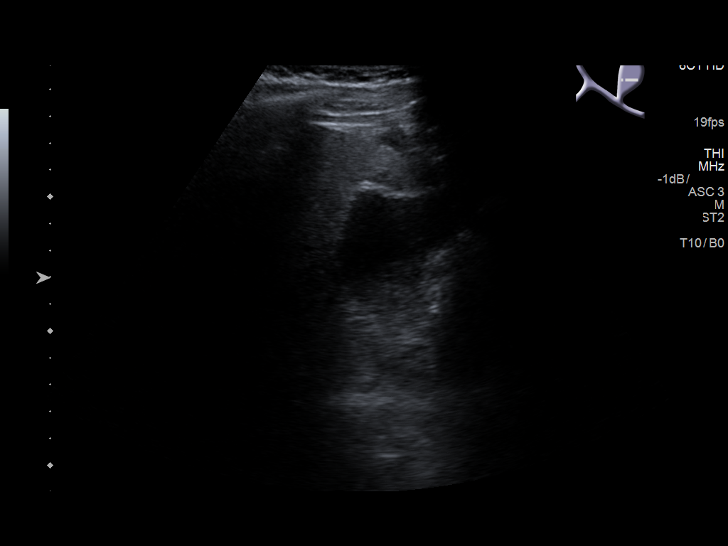
[im 15/44]
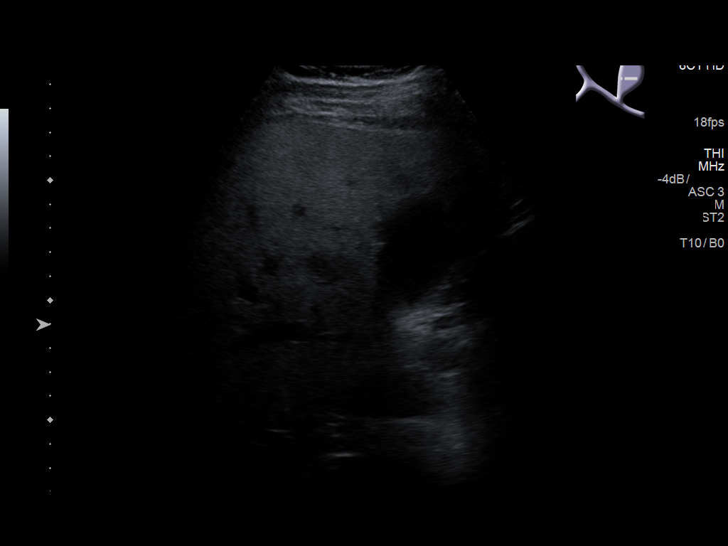
[im 17/44]
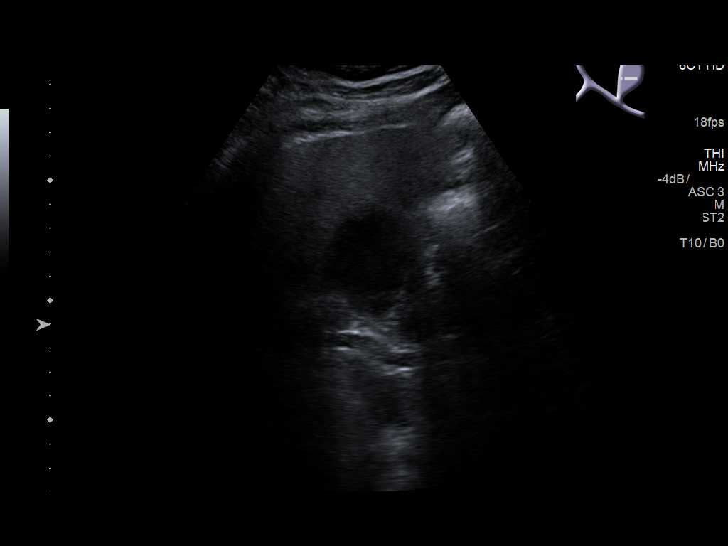
[im 20/44]
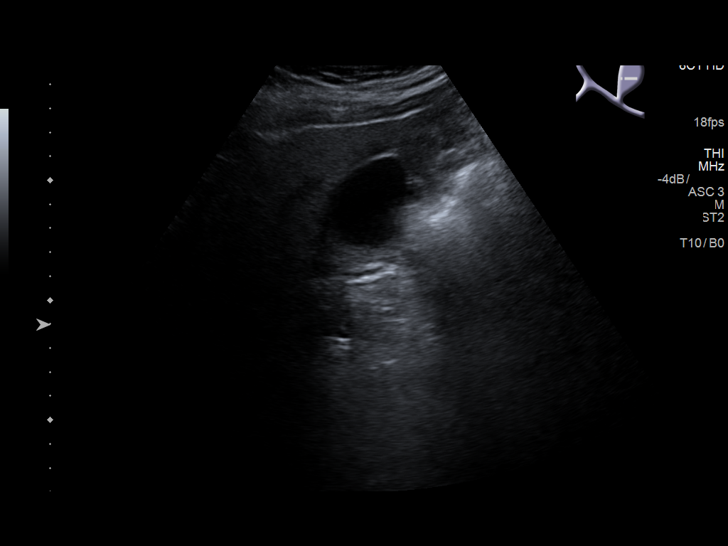
[im 24/44]
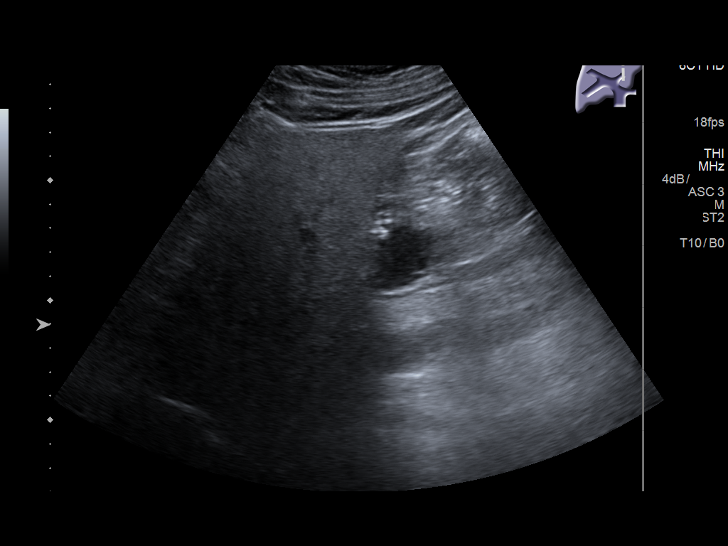
[im 27/44]
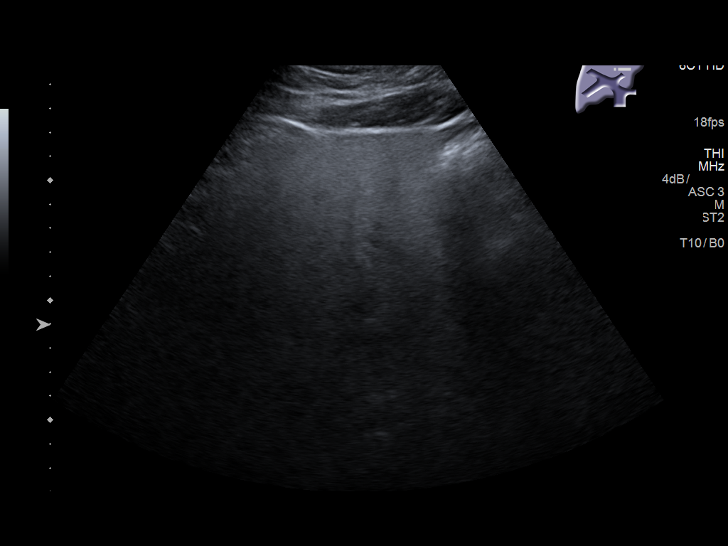
[im 29/44]
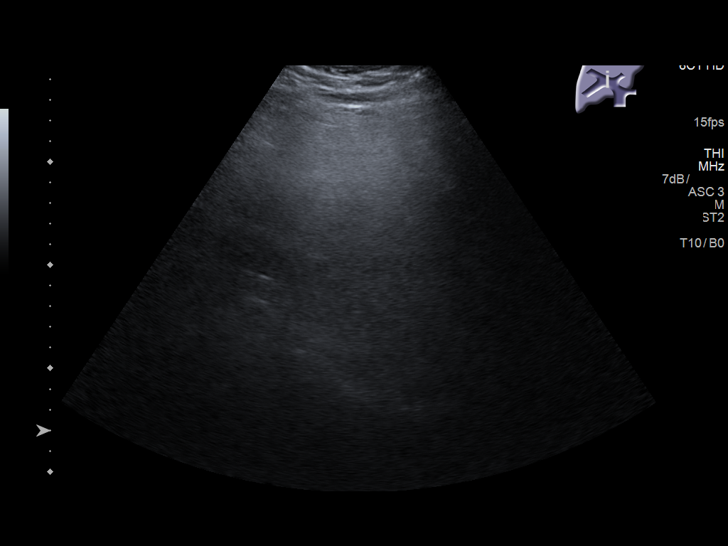
[im 33/44]
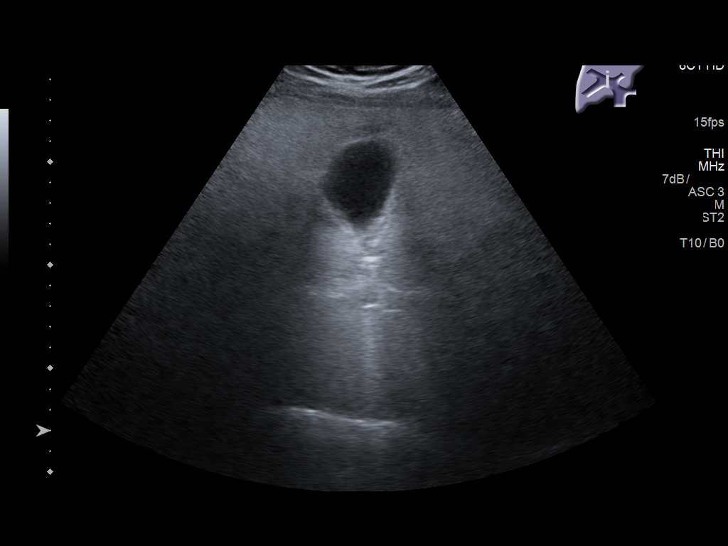
[im 36/44]
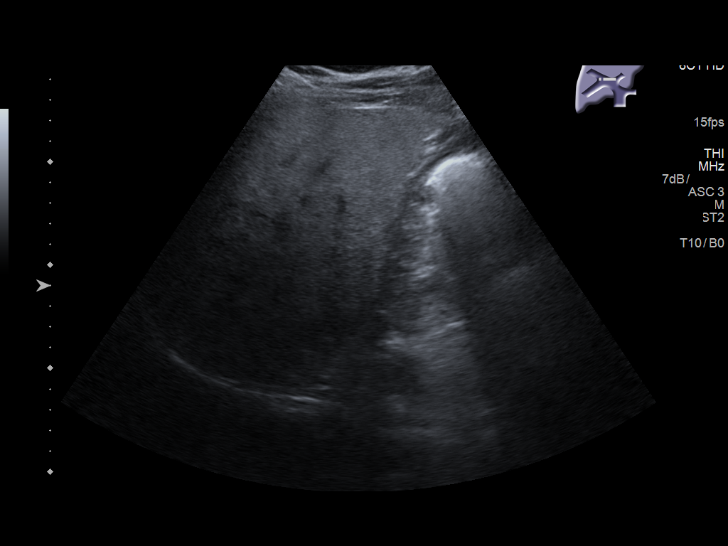
[im 40/44]
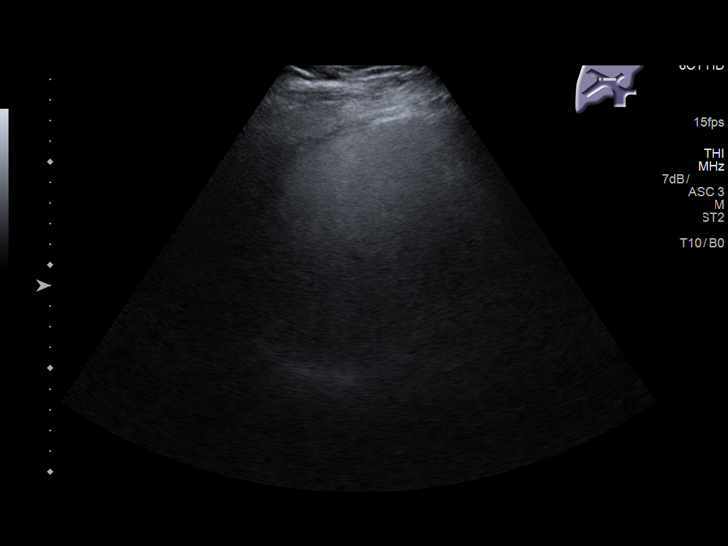
[im 44/44]
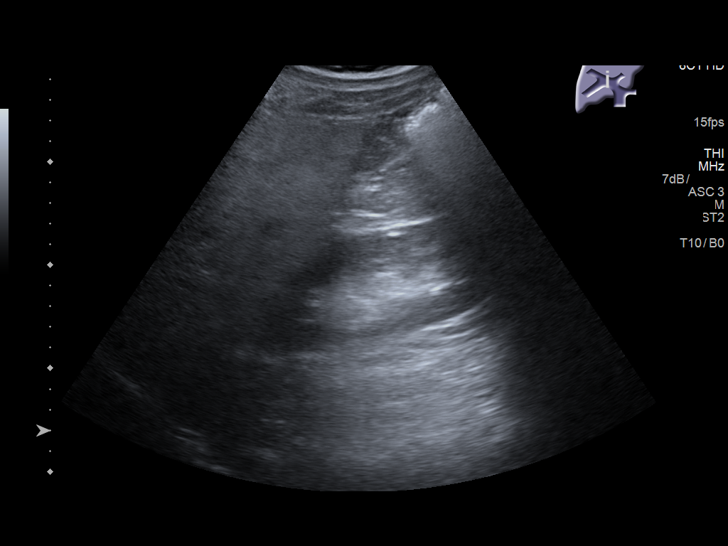

[14 of 25 positions shown; findings below may reference images not displayed]

FINDINGS: Gallbladder:

Multiple small polyps in the gallbladder neck, measuring up to 7 mm
in diameter each. No gallbladder wall thickening or pericholecystic
fluid. No sonographic Murphy sign.

Common bile duct:

Diameter: 6.2 mm

Liver:

No focal lesion identified. Within normal limits in parenchymal
echogenicity. Portal vein is patent on color Doppler imaging with
normal direction of blood flow towards the liver.
IMPRESSION: 1. Several small gallbladder polyps in the neck of the gallbladder.
2. Diffusely echogenic liver, most likely due to steatosis. Chronic
hepatitis or cirrhosis could also produce this appearance. The liver
had a normal appearance on the previous CT.

## 2019-10-12 ENCOUNTER — Other Ambulatory Visit: Payer: Self-pay

## 2019-10-12 ENCOUNTER — Emergency Department: Payer: BC Managed Care – PPO

## 2019-10-12 DIAGNOSIS — Z7982 Long term (current) use of aspirin: Secondary | ICD-10-CM | POA: Diagnosis not present

## 2019-10-12 DIAGNOSIS — M79605 Pain in left leg: Secondary | ICD-10-CM | POA: Diagnosis not present

## 2019-10-12 DIAGNOSIS — Z87891 Personal history of nicotine dependence: Secondary | ICD-10-CM | POA: Insufficient documentation

## 2019-10-12 DIAGNOSIS — M25462 Effusion, left knee: Secondary | ICD-10-CM | POA: Diagnosis not present

## 2019-10-12 DIAGNOSIS — E119 Type 2 diabetes mellitus without complications: Secondary | ICD-10-CM | POA: Insufficient documentation

## 2019-10-12 DIAGNOSIS — I129 Hypertensive chronic kidney disease with stage 1 through stage 4 chronic kidney disease, or unspecified chronic kidney disease: Secondary | ICD-10-CM | POA: Insufficient documentation

## 2019-10-12 DIAGNOSIS — N189 Chronic kidney disease, unspecified: Secondary | ICD-10-CM | POA: Insufficient documentation

## 2019-10-12 DIAGNOSIS — M25562 Pain in left knee: Secondary | ICD-10-CM | POA: Diagnosis not present

## 2019-10-12 DIAGNOSIS — Z794 Long term (current) use of insulin: Secondary | ICD-10-CM | POA: Insufficient documentation

## 2019-10-12 DIAGNOSIS — Z79899 Other long term (current) drug therapy: Secondary | ICD-10-CM | POA: Insufficient documentation

## 2019-10-12 LAB — BASIC METABOLIC PANEL
Anion gap: 9 (ref 5–15)
BUN: 22 mg/dL — ABNORMAL HIGH (ref 6–20)
CO2: 25 mmol/L (ref 22–32)
Calcium: 9.2 mg/dL (ref 8.9–10.3)
Chloride: 102 mmol/L (ref 98–111)
Creatinine, Ser: 1.48 mg/dL — ABNORMAL HIGH (ref 0.61–1.24)
GFR calc Af Amer: >60 mL/min (ref >60)
GFR calc non Af Amer: 56 mL/min — ABNORMAL LOW (ref >60)
Glucose, Bld: 279 mg/dL — ABNORMAL HIGH (ref 70–99)
Potassium: 4.1 mmol/L (ref 3.5–5.1)
Sodium: 136 mmol/L (ref 135–145)

## 2019-10-12 LAB — CBC
HCT: 42.7 % (ref 39.0–52.0)
Hemoglobin: 14.8 g/dL (ref 13.0–17.0)
MCH: 29.8 pg (ref 26.0–34.0)
MCHC: 34.7 g/dL (ref 30.0–36.0)
MCV: 85.9 fL (ref 80.0–100.0)
Platelets: 252 10*3/uL (ref 150–400)
RBC: 4.97 MIL/uL (ref 4.22–5.81)
RDW: 12.5 % (ref 11.5–15.5)
WBC: 6.8 10*3/uL (ref 4.0–10.5)
nRBC: 0 % (ref 0.0–0.2)

## 2019-10-12 LAB — FIBRIN DERIVATIVES D-DIMER (ARMC ONLY): Fibrin derivatives D-dimer (ARMC): 258.35 ng/mL (FEU) (ref 0.00–499.00)

## 2019-10-12 NOTE — ED Notes (Signed)
Patient returned from [facility]

## 2019-10-12 NOTE — ED Triage Notes (Signed)
Pt to the er for left lateral knee pain without injury. Pain started slowly tonight and now is painful to touch and any movement. Pt has no hx of dvt but a hx of htn. Pt is hypertensive in triage. Inflammation noted.

## 2019-10-13 ENCOUNTER — Emergency Department: Payer: BC Managed Care – PPO

## 2019-10-13 ENCOUNTER — Emergency Department
Admission: EM | Admit: 2019-10-13 | Discharge: 2019-10-13 | Disposition: A | Discharge status: Home / Self Care | Payer: BC Managed Care – PPO | Attending: Emergency Medicine | Admitting: Emergency Medicine

## 2019-10-13 DIAGNOSIS — M25562 Pain in left knee: Secondary | ICD-10-CM

## 2019-10-13 DIAGNOSIS — M25462 Effusion, left knee: Secondary | ICD-10-CM | POA: Diagnosis not present

## 2019-10-13 MED ORDER — OXYCODONE-ACETAMINOPHEN 5-325 MG PO TABS
1.0000 | ORAL_TABLET | Freq: Once | ORAL | Status: AC
  Administered 2019-10-13: 1 via ORAL
  Filled 2019-10-13: qty 1

## 2019-10-13 MED ORDER — LIDOCAINE 5 % EX PTCH
1.0000 | MEDICATED_PATCH | CUTANEOUS | Status: DC
  Administered 2019-10-13: 1 via TRANSDERMAL
  Filled 2019-10-13: qty 1

## 2019-10-13 MED ORDER — LIDOCAINE 5 % EX PTCH
1.0000 | MEDICATED_PATCH | Freq: Two times a day (BID) | CUTANEOUS | 0 refills | Status: DC
Start: 2019-10-13 — End: 2020-02-25

## 2019-10-13 MED ORDER — ACETAMINOPHEN 325 MG PO TABS
650.0000 mg | ORAL_TABLET | Freq: Once | ORAL | Status: AC
  Administered 2019-10-13: 650 mg via ORAL
  Filled 2019-10-13: qty 2

## 2019-10-13 NOTE — ED Notes (Signed)
Patient given update on wait time. Patient verbalizes understanding.  

## 2019-10-13 NOTE — ED Notes (Signed)
Pt reports waking from nap left left outer knee pain, worse with palpation, denies trauma, no pain meds in thew ;last 24 hours

## 2019-10-13 NOTE — ED Notes (Signed)
No peripheral IV placed this visit.   Discharge instructions reviewed with patient. Questions fielded by this RN. Patient verbalizes understanding of instructions. Patient discharged home in stable condition per [facility] . No acute distress noted at time of discharge. '  Pt wheeled to car

## 2019-10-13 NOTE — ED Provider Notes (Signed)
[facility] Emergency Department Provider Note   ____________________________________________   First MD Initiated Contact with Patient 10/13/19 [phone]     (approximate)  I have reviewed the triage vital signs and the nursing notes.   HISTORY  Chief Complaint Knee Pain    HPI Nathan Rojas is a 44 y.o. male with past medical history of hypertension, hyperlipidemia, diabetes, and CKD who presents to the ED complaining of knee pain.  Patient reports that he was out walking around during the day and running errands, when he went to sit down this evening noticed gradually worsening pain to the lateral portion of his left knee.  He denies any specific trauma to his knee, did not fall or twisted in any unusual way.  He has never had issues with this knee previously, but does report arthritis in other joints.  He has not noticed any swelling to the knee, but states he has significant pain when he goes to bear weight on his left leg.  He denies any rashes or redness to the skin around his knee, has not had any fevers.  He has not taken any medications for his pain prior to arrival.        Past Medical History:  Diagnosis Date  . CKD (chronic kidney disease)   . Hyperlipidemia   . Hypertension   . MRSA infection around age 89   left hand wound  . Rheumatoid arthritis, 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HCC) 01/23/2016  . Trigeminal neuralgia 06/03/2015  . Type 2 diabetes mellitus, uncontrolled (HCC)     Patient Active Problem List   Diagnosis Date Noted  . Preventative health care 01/10/2018  . Erectile dysfunction 04/27/2017  . Abnormal liver diagnostic imaging 03/18/2017  . Vitamin D deficiency 07/09/2016  . Flexor tenosynovitis of thumb 05/27/2016  . Encounter for long-term (current) use of high-risk medication 03/05/2016  . Chronic pain of multiple joints 01/23/2016  . CRP elevated 01/23/2016  . Elevated rheumatoid factor 01/23/2016  . Rheumatoid arthritis, seropositive  (HCC) 01/23/2016  . Arthralgia of multiple joints 01/09/2016  . Trigeminal neuralgia 06/03/2015  . Medication monitoring encounter 04/18/2015  . Obesity 04/18/2015  . Kidney stone 11/22/2014  . Abdominal aortic atherosclerosis (HCC) 11/16/2014  . Type 2 diabetes mellitus, uncontrolled (HCC)   . Hypertension   . Hyperlipidemia   . CKD (chronic kidney disease)   . Encounter for sterilization 04/20/2013    Past Surgical History:  Procedure Laterality Date  . APPENDECTOMY    . CHOLECYSTECTOMY N/A 05/06/2017   Procedure: LAPAROSCOPIC CHOLECYSTECTOMY;  Surgeon: Ancil Linsey, MD;  Location: [facility] ORS;  Service: General;  Laterality: N/A;  . TONSILLECTOMY    . VASECTOMY  Jan 2015    Prior to Admission medications   Medication Sig Start Date End Date Taking? Authorizing Provider  aspirin EC 81 MG tablet Take 81 mg by mouth daily.    [provider]  atorvastatin (LIPITOR) 20 MG tablet Take 1 tablet (20 mg total) by mouth at bedtime. 12/12/18   Danelle Berry, PA-C  cholecalciferol (VITAMIN D) 1000 units tablet Take 1 tablet (1,000 Units total) by mouth daily. 01/04/18   Kerman Passey, MD  empagliflozin (JARDIANCE) 25 MG TABS tablet Take 25 mg by mouth daily. 01/24/18   [provider]  Icosapent Ethyl (VASCEPA) 1 g CAPS TAKE 2 CAPSULES by mouth TWICE A DAY 12/27/18   Danelle Berry, PA-C  insulin glargine (LANTUS) 100 UNIT/ML injection Inject 0.1 mLs (10 Units total) into the skin  daily. Patient taking differently: Inject 20 Units into the skin daily.  01/04/18   Lada, Janit Bern, MD  insulin regular (HUMULIN R) 100 units/mL injection Inject 0.2-0.3 mLs (20-30 Units total) into the skin 3 (three) times daily before meals. 01/04/18   Lada, Janit Bern, MD  lidocaine (LIDODERM) 5 % Place 1 patch onto the skin every 12 (twelve) hours. Remove & Discard patch within 12 hours or as directed by MD 10/13/19 10/12/20  Chesley Noon, MD  lisinopril (ZESTRIL) 5 MG tablet Take 1 tablet (5 mg  total) by mouth daily. 12/12/18   Danelle Berry, PA-C  Multiple Vitamin (MULTIVITAMIN WITH MINERALS) TABS tablet Take 1 tablet by mouth daily.    [provider]  naproxen sodium (ALEVE) 220 MG tablet Take 220 mg by mouth daily as needed (pain).  03/11/17   Kerman Passey, MD  sildenafil (VIAGRA) 100 MG tablet Take 0.5 tablets (50 mg total) by mouth daily as needed for erectile dysfunction. Do NOT take with nitrates, can be fatal 12/12/18   Danelle Berry, PA-C    Allergies Patient has no known allergies.  Family History  Problem Relation Age of Onset  . Diabetes Sister        type 1  . Diabetes Mother   . Dementia Maternal Grandmother   . AAA (abdominal aortic aneurysm) Maternal Grandfather   . Hypertension Maternal Grandfather     Social History Social History   Tobacco Use  . Smoking status: Former Smoker    Packs/day: 1.00    Years: 25.00    Pack years: 25.00    Types: Cigarettes    Start date: 03/29/1989    Quit date: 02/08/2015    Years since quitting: 4.6  . Smokeless tobacco: Never Used  Vaping Use  . Vaping Use: Never used  Substance Use Topics  . Alcohol use: Yes    Alcohol/week: 2.0 standard drinks    Types: 1 Shots of liquor, 1 Cans of beer per week    Comment: 1-2 drinks/week  . Drug use: No    Review of Systems  Constitutional: No fever/chills Eyes: No visual changes. ENT: No sore throat. Cardiovascular: Denies chest pain. Respiratory: Denies shortness of breath. Gastrointestinal: No abdominal pain.  No nausea, no vomiting.  No diarrhea.  No constipation. Genitourinary: Negative for dysuria. Musculoskeletal: Negative for back pain.  Positive for left knee pain. Skin: Negative for rash. Neurological: Negative for headaches, focal weakness or numbness.  ____________________________________________   PHYSICAL EXAM:  VITAL SIGNS: ED Triage Vitals [10/12/19 2120]  Enc Vitals Group     BP (!) 180/108     Pulse Rate 90     Resp 18     Temp  98.3 F (36.8 C)     Temp Source Oral     SpO2 96 %     Weight 268 lb (121.6 kg)     Height 6' 1.5" (1.867 m)     Head Circumference      Peak Flow      Pain Score 9     Pain Loc      Pain Edu?      Excl. in GC?     Constitutional: Alert and oriented. Eyes: Conjunctivae are normal. Head: Atraumatic. Nose: No congestion/rhinnorhea. Mouth/Throat: Mucous membranes are moist. Neck: Normal ROM Cardiovascular: Normal rate, regular rhythm. Grossly normal heart sounds. Respiratory: Normal respiratory effort.  No retractions. Lungs CTAB. Gastrointestinal: Soft and nontender. No distention. Genitourinary: deferred Musculoskeletal: Tenderness over left lateral portion  of left knee with no laxity noted on ligamentous exam, negative Lachman test.  2+ DP pulses bilaterally.  No edema, erythema, or warmth noted to left knee. Neurologic:  Normal speech and language. No gross focal neurologic deficits are appreciated. Skin:  Skin is warm, dry and intact. No rash noted. Psychiatric: Mood and affect are normal. Speech and behavior are normal.  ____________________________________________   LABS (all labs ordered are listed, but only abnormal results are displayed)  Labs Reviewed  BASIC METABOLIC PANEL - Abnormal; Notable for the following components:      Result Value   Glucose, Bld 279 (*)    BUN 22 (*)    Creatinine, Ser 1.48 (*)    GFR calc non Af Amer 56 (*)    All other components within normal limits  CBC  FIBRIN DERIVATIVES D-DIMER ([facility] ONLY)    PROCEDURES  Procedure(s) performed (including Critical Care):  Procedures   ____________________________________________   INITIAL IMPRESSION / ASSESSMENT AND PLAN / ED COURSE       44 year old male with past medical history of hypertension, hyperlipidemia, diabetes, and CKD presents to the ED with left knee pain worsening throughout the evening.  The majority of his pain appears to be to the lateral portion of his left knee  with associated tenderness to palpation, however he has no obvious deformity and no signs of infectious process.  He is able to range the left knee and I doubt a septic arthritis.  There is no effusion to suggest gout.  It is possible that he suffered ligamentous or meniscal injury, however there are no obvious signs of this on exam.  Ultrasound is negative for DVT, will check x-ray.  Lab work is remarkable for mild AKI but no leukocytosis.  We will treat symptomatically with Lidoderm patch and Tylenol.  X-ray shows small suprapatellar effusion but is otherwise unremarkable.  Patient has had minimal relief from lidocaine patch and Tylenol, we will give one-time dose of Percocet.  He is otherwise appropriate for discharge home with orthopedic follow-up, we will place knee immobilizer and provided with crutches.  He was counseled to schedule appointment with orthopedics and otherwise return to the ED for new or worsening symptoms, patient agrees with plan.      ____________________________________________   FINAL CLINICAL IMPRESSION(S) / ED DIAGNOSES  Final diagnoses:  Acute pain of left knee     ED Discharge Orders         Ordered    lidocaine (LIDODERM) 5 %  Every 12 hours     Discontinue  Reprint     10/13/19 0430           Note:  This document was prepared using Dragon voice recognition software and may include unintentional dictation errors.   Chesley Noon, MD 10/13/19 [phone]

## 2019-10-29 DIAGNOSIS — M2550 Pain in unspecified joint: Secondary | ICD-10-CM | POA: Diagnosis not present

## 2019-10-29 DIAGNOSIS — M25462 Effusion, left knee: Secondary | ICD-10-CM | POA: Diagnosis not present

## 2020-01-07 DIAGNOSIS — E1169 Type 2 diabetes mellitus with other specified complication: Secondary | ICD-10-CM | POA: Diagnosis not present

## 2020-01-07 DIAGNOSIS — E1159 Type 2 diabetes mellitus with other circulatory complications: Secondary | ICD-10-CM | POA: Diagnosis not present

## 2020-01-07 DIAGNOSIS — Z794 Long term (current) use of insulin: Secondary | ICD-10-CM | POA: Diagnosis not present

## 2020-01-07 DIAGNOSIS — E669 Obesity, unspecified: Secondary | ICD-10-CM | POA: Diagnosis not present

## 2020-01-07 DIAGNOSIS — E1165 Type 2 diabetes mellitus with hyperglycemia: Secondary | ICD-10-CM | POA: Diagnosis not present

## 2020-01-22 ENCOUNTER — Other Ambulatory Visit: Payer: Self-pay

## 2020-01-22 ENCOUNTER — Ambulatory Visit (INDEPENDENT_AMBULATORY_CARE_PROVIDER_SITE_OTHER): Payer: BC Managed Care – PPO | Admitting: Family Medicine

## 2020-01-22 ENCOUNTER — Encounter: Payer: Self-pay | Admitting: Family Medicine

## 2020-01-22 VITALS — BP 136/84 | HR 82 | Temp 98.2°F | Resp 18 | Ht 74.0 in | Wt 277.5 lb

## 2020-01-22 DIAGNOSIS — Z5181 Encounter for therapeutic drug level monitoring: Secondary | ICD-10-CM | POA: Diagnosis not present

## 2020-01-22 DIAGNOSIS — E782 Mixed hyperlipidemia: Secondary | ICD-10-CM

## 2020-01-22 DIAGNOSIS — E1165 Type 2 diabetes mellitus with hyperglycemia: Secondary | ICD-10-CM

## 2020-01-22 DIAGNOSIS — I700 Atherosclerosis of aorta: Secondary | ICD-10-CM

## 2020-01-22 DIAGNOSIS — R6882 Decreased libido: Secondary | ICD-10-CM

## 2020-01-22 DIAGNOSIS — M059 Rheumatoid arthritis with rheumatoid factor, unspecified: Secondary | ICD-10-CM

## 2020-01-22 DIAGNOSIS — N529 Male erectile dysfunction, unspecified: Secondary | ICD-10-CM

## 2020-01-22 DIAGNOSIS — R7989 Other specified abnormal findings of blood chemistry: Secondary | ICD-10-CM

## 2020-01-22 DIAGNOSIS — IMO0002 Reserved for concepts with insufficient information to code with codable children: Secondary | ICD-10-CM

## 2020-01-22 DIAGNOSIS — Z794 Long term (current) use of insulin: Secondary | ICD-10-CM

## 2020-01-22 DIAGNOSIS — L237 Allergic contact dermatitis due to plants, except food: Secondary | ICD-10-CM

## 2020-01-22 DIAGNOSIS — I10 Essential (primary) hypertension: Secondary | ICD-10-CM | POA: Diagnosis not present

## 2020-01-22 MED ORDER — ATORVASTATIN CALCIUM 20 MG PO TABS: 20.0000 mg | ORAL_TABLET | Freq: Every day | ORAL | 3 refills | Status: DC

## 2020-01-22 MED ORDER — PREDNISONE 10 MG (21) PO TBPK
ORAL_TABLET | ORAL | 0 refills | Status: DC
Start: 2020-01-22 — End: 2020-02-25

## 2020-01-22 MED ORDER — LISINOPRIL 10 MG PO TABS: 10.0000 mg | ORAL_TABLET | Freq: Every day | ORAL | 3 refills | Status: DC

## 2020-01-22 NOTE — Progress Notes (Signed)
Name: Nathan Rojas   MRN: [id number]    DOB: February 11, 1976   Date:01/22/2020       Progress Note  Chief Complaint  Patient presents with  . Hypertension  . Hyperlipidemia  . Diabetes     Subjective:   Nathan Rojas is a 44 y.o. male, presents to clinic for routine f/up  Here for med refill and f/up Last seen over a year ago He has been current with seeing [facility] for IDDM  Alphia Kava, NP - 01/07/2020 1:15 PM EDT  Formatting of this note is different from the original. - Continue Novolin R 40-50 units before meals  - Continue Novolin N 40 units daily before bed  - Labs today - we will contact you with new med orders after labs are resulted  - Check blood sugars three times daily. Please bring your meter to next clinic visit - Continue diet changes and healthy eating patterns. Initiate exercise activity  - Return to clinic in 3 months   A1C was 9.5 on 01/07/2020 Last A1C prior to that 11/08/2018 8.9   Hyperlipidemia: Currently treated with lipitor , pt reports good med compliance Last Lipids: Lab Results  Component Value Date   CHOL 132 04/07/2018   HDL 28 (L) 04/07/2018   LDLCALC 64 04/07/2018   TRIG 329 (H) 04/07/2018   CHOLHDL 4.7 04/07/2018   - Denies: Chest pain, shortness of breath, myalgias, claudication  Hypertension:  Currently managed on lisinopril 10 mg Pt reports good med compliance and denies any SE.   Blood pressure today is slightly above goal would like to have him less than 130/80. BP Readings from Last 6 Encounters:  01/22/20 136/84  10/13/19 (!) 143/97  04/07/18 122/74  01/18/18 118/84   Pt denies CP, SOB, exertional sx, LE edema, palpitation, Ha's, visual disturbances, lightheadedness, hypotension, syncope.  ED - was on viagra  Low Vit D -takes OTC supplement  Low libido, that came first, has affected their relationship, viagra didn't do much he would like to be checked for low testosterone or  see a specialist   Current Outpatient Medications:  .  aspirin EC 81 MG tablet, Take 81 mg by mouth daily., Disp: , Rfl:  .  atorvastatin (LIPITOR) 20 MG tablet, Take 1 tablet (20 mg total) by mouth at bedtime., Disp: 90 tablet, Rfl: 1 .  cholecalciferol (VITAMIN D) 1000 units tablet, Take 1 tablet (1,000 Units total) by mouth daily., Disp: , Rfl:  .  empagliflozin (JARDIANCE) 25 MG TABS tablet, Take 25 mg by mouth daily., Disp: , Rfl:  .  Icosapent Ethyl (VASCEPA) 1 g CAPS, TAKE 2 CAPSULES by mouth TWICE A DAY, Disp: 360 capsule, Rfl: 3 .  insulin glargine (LANTUS) 100 UNIT/ML injection, Inject 0.1 mLs (10 Units total) into the skin daily. (Patient taking differently: Inject 20 Units into the skin daily. ), Disp: 10 mL, Rfl: 11 .  insulin regular (HUMULIN R) 100 units/mL injection, Inject 0.2-0.3 mLs (20-30 Units total) into the skin 3 (three) times daily before meals., Disp: 9 vial, Rfl: 3 .  lidocaine (LIDODERM) 5 %, Place 1 patch onto the skin every 12 (twelve) hours. Remove & Discard patch within 12 hours or as directed by MD, Disp: 10 patch, Rfl: 0 .  lisinopril (ZESTRIL) 5 MG tablet, Take 1 tablet (5 mg total) by mouth daily., Disp: 90 tablet, Rfl: 1 .  Multiple Vitamin (MULTIVITAMIN WITH MINERALS) TABS tablet, Take 1 tablet by mouth daily., Disp: , Rfl:  .  naproxen sodium (ALEVE) 220 MG tablet, Take 220 mg by mouth daily as needed (pain). , Disp: , Rfl:  .  sildenafil (VIAGRA) 100 MG tablet, Take 0.5 tablets (50 mg total) by mouth daily as needed for erectile dysfunction. Do NOT take with nitrates, can be fatal, Disp: 5 tablet, Rfl: 3  Patient Active Problem List   Diagnosis Date Noted  . Preventative health care 01/10/2018  . Erectile dysfunction 04/27/2017  . Abnormal liver diagnostic imaging 03/18/2017  . Vitamin D deficiency 07/09/2016  . Flexor tenosynovitis of thumb 05/27/2016  . Encounter for long-term (current) use of high-risk medication 03/05/2016  . Chronic pain of  multiple joints 01/23/2016  . CRP elevated 01/23/2016  . Elevated rheumatoid factor 01/23/2016  . Rheumatoid arthritis, 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HCC) 01/23/2016  . Arthralgia of multiple joints 01/09/2016  . Trigeminal neuralgia 06/03/2015  . Medication monitoring encounter 04/18/2015  . Obesity 04/18/2015  . Kidney stone 11/22/2014  . Abdominal aortic atherosclerosis (HCC) 11/16/2014  . Type 2 diabetes mellitus, uncontrolled (HCC)   . Hypertension   . Hyperlipidemia   . CKD (chronic kidney disease)   . Encounter for sterilization 04/20/2013    Past Surgical History:  Procedure Laterality Date  . APPENDECTOMY    . CHOLECYSTECTOMY N/A 05/06/2017   Procedure: LAPAROSCOPIC CHOLECYSTECTOMY;  Surgeon: Ancil Linsey, MD;  Location: [facility] ORS;  Service: General;  Laterality: N/A;  . TONSILLECTOMY    . VASECTOMY  Jan 2015    Family History  Problem Relation Age of Onset  . Diabetes Sister        type 1  . Diabetes Mother   . Dementia Maternal Grandmother   . AAA (abdominal aortic aneurysm) Maternal Grandfather   . Hypertension Maternal Grandfather     Social History   Tobacco Use  . Smoking status: Former Smoker    Packs/day: 1.00    Years: 25.00    Pack years: 25.00    Types: Cigarettes    Start date: 03/29/1989    Quit date: 02/08/2015    Years since quitting: 4.9  . Smokeless tobacco: Never Used  Vaping Use  . Vaping Use: Never used  Substance Use Topics  . Alcohol use: Yes    Alcohol/week: 2.0 standard drinks    Types: 1 Shots of liquor, 1 Cans of beer per week    Comment: 1-2 drinks/week  . Drug use: Rojas     Rojas Known Allergies  Health Maintenance  Topic Date Due  . COVID-19 Vaccine (1) Never done  . HIV Screening  Never done  . HEMOGLOBIN A1C  10/06/2018  . FOOT EXAM  04/08/2019  . OPHTHALMOLOGY EXAM  05/11/2019  . INFLUENZA VACCINE  10/28/2019  . TETANUS/TDAP  03/29/2020  . PNEUMOCOCCAL POLYSACCHARIDE VACCINE AGE 19-64 HIGH RISK  Completed  . Hepatitis C  Screening  Completed    Chart Review Today: I personally reviewed active problem list, medication list, allergies, family history, social history, health maintenance, notes from last encounter, lab results, imaging with the patient/caregiver today.   Review of Systems  10 Systems reviewed and are negative for acute change except as noted in the HPI.  Objective:   Vitals:   01/22/20 1121  BP: 136/84  Pulse: 82  Resp: 18  Temp: 98.2 F (36.8 C)  TempSrc: Oral  SpO2: 98%  Weight: 277 lb 8 oz (125.9 kg)  Height: 6\' 2"  (1.88 m)    Body mass index is 35.63 kg/m.  Physical Exam Vitals and nursing note reviewed.  Constitutional:      General: He is not in acute distress.    Appearance: Normal appearance. He is well-developed. He is obese. He is not ill-appearing, toxic-appearing or diaphoretic.     Interventions: Face mask in place.  HENT:     Head: Normocephalic and atraumatic.     Jaw: Rojas trismus.     Right Ear: External ear normal.     Left Ear: External ear normal.  Eyes:     General: Lids are normal. Rojas scleral icterus.       Right eye: Rojas discharge.        Left eye: Rojas discharge.     Conjunctiva/sclera: Conjunctivae normal.  Neck:     Trachea: Trachea and phonation normal. Rojas tracheal deviation.  Cardiovascular:     Rate and Rhythm: Normal rate and regular rhythm.     Pulses: Normal pulses.          Radial pulses are 2+ on the right side and 2+ on the left side.       Posterior tibial pulses are 2+ on the right side and 2+ on the left side.     Heart sounds: Normal heart sounds. Rojas murmur heard. Rojas friction rub. Rojas gallop.   Pulmonary:     Effort: Pulmonary effort is normal. Rojas respiratory distress.     Breath sounds: Normal breath sounds. Rojas stridor. Rojas wheezing, rhonchi or rales.  Abdominal:     General: Bowel sounds are normal. There is Rojas distension.     Palpations: Abdomen is soft.     Tenderness: There is Rojas abdominal tenderness. There is Rojas guarding.   Musculoskeletal:     Right lower leg: Rojas edema.     Left lower leg: Rojas edema.  Skin:    General: Skin is warm and dry.     Capillary Refill: Capillary refill takes less than 2 seconds.     Coloration: Skin is not jaundiced.     Findings: Rojas rash.     Nails: There is Rojas clubbing.  Neurological:     Mental Status: He is alert. Mental status is at baseline.     Cranial Nerves: Rojas dysarthria or facial asymmetry.     Motor: Rojas tremor or abnormal muscle tone.     Gait: Gait normal.  Psychiatric:        Mood and Affect: Mood normal.        Speech: Speech normal.        Behavior: Behavior normal. Behavior is cooperative.          Assessment & Plan:     ICD-10-CM   1. Mixed hyperlipidemia  E78.2 Lipid panel    COMPLETE METABOLIC PANEL WITH GFR    atorvastatin (LIPITOR) 20 MG tablet   Compliant with statin, Rojas myalgias side effects or concerns, due for labs for monitoring of liver function and lipid panel continue diet lifestyle efforts, meds  2. Essential hypertension  I10 COMPLETE METABOLIC PANEL WITH GFR    lisinopril (ZESTRIL) 10 MG tablet   Blood pressure slightly elevated above goal, on lisinopril, goal to have him under 130/80 slight increase from his past vital signs monitor work on diet exercis  3. Rheumatoid arthritis, 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HCC)  M05.9 CBC with Differential/Platelet    COMPLETE METABOLIC PANEL WITH GFR   Managed by specialist, past labs and most recent office visit reviewed  4. Insulin dependent type 2 diabetes mellitus, uncontrolled (HCC)  E11.65 Lipid panel   Z79.4 COMPLETE METABOLIC  PANEL WITH GFR    atorvastatin (LIPITOR) 20 MG tablet    lisinopril (ZESTRIL) 10 MG tablet   Per endocrinology, med doses and most recent labs reviewed through care everywhere  5. Abdominal aortic atherosclerosis (HCC) Chronic I70.0    On statin, monitoring  6. Contact dermatitis due to poison ivy  L23.7    Recent exposure to poison oak or poison ivy worsening despite  over-the-counter medications, steroid taper prescribed  7. Decreased libido  R68.82 Testosterone    Testosterone    Ambulatory referral to Urology   History of eating noon decreased libido screen for low testosterone and refer to urology  8. Erectile dysfunction, unspecified erectile dysfunction type  N52.9 Ambulatory referral to Urology   History of ED Viagra ineffective patient wishes to be tested for low testosterone discussed possible urology follow-up  9. Low testosterone  R79.89 Ambulatory referral to Urology  10. Encounter for medication monitoring  Z51.81 CBC with Differential/Platelet    Lipid panel    COMPLETE METABOLIC PANEL WITH GFR   Asked that staff help update HM tab and chart by abstracting labs and screenings done with specialists as visible in care everywhere: Health Maintenance  Topic Date Due  . COVID-19 Vaccine (1) Never done  . HIV Screening  Never done  . COLONOSCOPY (Pts [id number] Insurance coverage will need to be confirmed)  Never done  . HEMOGLOBIN A1C  10/06/2018  . FOOT EXAM  04/08/2019  . OPHTHALMOLOGY EXAM  05/11/2019  . INFLUENZA VACCINE  10/28/2019  . TETANUS/TDAP  03/29/2020  . PNEUMOCOCCAL POLYSACCHARIDE VACCINE AGE 45-64 HIGH RISK  Completed  . Hepatitis C Screening  Completed   As documented in my note: A1C was 9.5 on 01/07/2020 Last A1C prior to that 11/08/2018 8.9   Not updated or abstracted Possibly due for foot exam? He states endocrinology manages Need to request last DM eye exam Declines HIV screening and colonoscopy not discussed at this appt  Return for 3-4 month f/up on HTN/HLD and repeat labs.   Danelle BerryLeisa Abdirahman Chittum, PA-C 01/22/20 11:12 AM

## 2020-01-22 NOTE — Patient Instructions (Signed)
Take benadryl 25-50 mg every 4-6 hours up to max 300 mg a day Take zyrtec or claritin one tab daily at bedtime for the next 2 weeks to help with rash/itching Take steroids - your sugars will be highter  Please restart the cholesterol meds and blood pressure (kidney protection) meds and then I would like to see you in 3-4 months to check everything - labs, side effects, vital signs   Come back if you would like in the next 1-2 weeks to do testosterone blood tests between 8-9 am   I will put in a referral to urology

## 2020-01-23 LAB — COMPLETE METABOLIC PANEL WITH GFR
AG Ratio: 1.8 (calc) (ref 1.0–2.5)
ALT: 47 U/L — ABNORMAL HIGH (ref 9–46)
AST: 31 U/L (ref 10–40)
Albumin: 4.6 g/dL (ref 3.6–5.1)
Alkaline phosphatase (APISO): 57 U/L (ref 36–130)
BUN: 19 mg/dL (ref 7–25)
CO2: 25 mmol/L (ref 20–32)
Calcium: 9.5 mg/dL (ref 8.6–10.3)
Chloride: 103 mmol/L (ref 98–110)
Creat: 0.86 mg/dL (ref 0.60–1.35)
GFR, Est African American: 121 mL/min/{1.73_m2} (ref >=60)
GFR, Est Non African American: 104 mL/min/{1.73_m2} (ref >=60)
Globulin: 2.5 g/dL (calc) (ref 1.9–3.7)
Glucose, Bld: 182 mg/dL — ABNORMAL HIGH (ref 65–99)
Potassium: 4.3 mmol/L (ref 3.5–5.3)
Sodium: 135 mmol/L (ref 135–146)
Total Bilirubin: 0.9 mg/dL (ref 0.2–1.2)
Total Protein: 7.1 g/dL (ref 6.1–8.1)

## 2020-01-23 LAB — CBC WITH DIFFERENTIAL/PLATELET
Absolute Monocytes: 371 cells/uL (ref 200–950)
Basophils Absolute: 63 cells/uL (ref 0–200)
Basophils Relative: 1.1 %
Eosinophils Absolute: 120 cells/uL (ref 15–500)
Eosinophils Relative: 2.1 %
HCT: 44.4 % (ref 38.5–50.0)
Hemoglobin: 15.2 g/dL (ref 13.2–17.1)
Lymphs Abs: 1858 cells/uL (ref 850–3900)
MCH: 29.8 pg (ref 27.0–33.0)
MCHC: 34.2 g/dL (ref 32.0–36.0)
MCV: 87.1 fL (ref 80.0–100.0)
MPV: 10.7 fL (ref 7.5–12.5)
Monocytes Relative: 6.5 %
Neutro Abs: 3289 cells/uL (ref 1500–7800)
Neutrophils Relative %: 57.7 %
Platelets: 244 10*3/uL (ref 140–400)
RBC: 5.1 10*6/uL (ref 4.20–5.80)
RDW: 13.3 % (ref 11.0–15.0)
Total Lymphocyte: 32.6 %
WBC: 5.7 10*3/uL (ref 3.8–10.8)

## 2020-01-23 LAB — LIPID PANEL
Cholesterol: 179 mg/dL (ref <200)
HDL: 27 mg/dL — ABNORMAL LOW (ref >=40)
LDL Cholesterol (Calc): 107 mg/dL (calc) — ABNORMAL HIGH
Non-HDL Cholesterol (Calc): 152 mg/dL (calc) — ABNORMAL HIGH (ref <130)
Total CHOL/HDL Ratio: 6.6 (calc) — ABNORMAL HIGH (ref <5.0)
Triglycerides: 340 mg/dL — ABNORMAL HIGH (ref <150)

## 2020-01-28 DIAGNOSIS — R6882 Decreased libido: Secondary | ICD-10-CM | POA: Diagnosis not present

## 2020-01-28 LAB — TESTOSTERONE: Testosterone: 287 ng/dL (ref 250–827)

## 2020-02-04 DIAGNOSIS — R6882 Decreased libido: Secondary | ICD-10-CM | POA: Diagnosis not present

## 2020-02-05 LAB — TESTOSTERONE: Testosterone: 283 ng/dL (ref 250–827)

## 2020-02-25 ENCOUNTER — Ambulatory Visit (INDEPENDENT_AMBULATORY_CARE_PROVIDER_SITE_OTHER): Payer: BC Managed Care – PPO | Admitting: Urology

## 2020-02-25 ENCOUNTER — Other Ambulatory Visit: Payer: Self-pay

## 2020-02-25 VITALS — BP 131/95 | HR 73 | Ht 73.0 in | Wt 275.0 lb

## 2020-02-25 DIAGNOSIS — R6882 Decreased libido: Secondary | ICD-10-CM

## 2020-02-25 DIAGNOSIS — N5201 Erectile dysfunction due to arterial insufficiency: Secondary | ICD-10-CM | POA: Diagnosis not present

## 2020-02-25 MED ORDER — AMBULATORY NON FORMULARY MEDICATION: 0 refills | Status: DC

## 2020-02-25 NOTE — Progress Notes (Signed)
02/25/2020 1:43 PM   Nathan Rojas October 26, 1975 [id number]  Referring provider: Danelle Berry, PA-C [address] [place],  [place] [address]  Chief Complaint  Patient presents with  . Erectile Dysfunction    HPI: Nathan Rojas is a 44 y.o. male seen at the request of Danelle Berry, PA-C for evaluation of decreased libido, erectile dysfunction and hypogonadism.   Several year history of ED however worse the last 1-2 years  Using sildenafil which was initially effective however recently has not been effective even with increasing to 150 mg  Significant organic risk factors including diabetes, hypertension, hyperlipidemia, antihypertensive medication and 1-1.5 pack/day x 30 years smoking history (quit 6 years ago)  No pain or curvature with erections  Occasionally can get erections firm enough for penetration however difficulty maintaining; other times he has difficulty achieving an erection  Does note decreased libido, tiredness and fatigue  Testosterone levels drawn earlier this month were in the low normal range at 283 and 287   PMH: Past Medical History:  Diagnosis Date  . CKD (chronic kidney disease)   . Hyperlipidemia   . Hypertension   . MRSA infection around age 82   left hand wound  . Rheumatoid arthritis, 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HCC) 01/23/2016  . Trigeminal neuralgia 06/03/2015  . Type 2 diabetes mellitus, uncontrolled (HCC)     Surgical History: Past Surgical History:  Procedure Laterality Date  . APPENDECTOMY    . CHOLECYSTECTOMY N/A 05/06/2017   Procedure: LAPAROSCOPIC CHOLECYSTECTOMY;  Surgeon: Ancil Linsey, MD;  Location: [facility] ORS;  Service: General;  Laterality: N/A;  . TONSILLECTOMY    . VASECTOMY  Jan 2015    Home Medications:  Allergies as of 02/25/2020   No Known Allergies     Medication List       Accurate as of February 25, 2020  1:43 PM. If you have any questions, ask your nurse or doctor.        aspirin EC 81 MG tablet Take 81  mg by mouth daily.   atorvastatin 20 MG tablet Commonly known as: LIPITOR Take 1 tablet (20 mg total) by mouth at bedtime.   cholecalciferol 25 MCG (1000 UNIT) tablet Commonly known as: VITAMIN D Take 1 tablet (1,000 Units total) by mouth daily.   empagliflozin 25 MG Tabs tablet Commonly known as: JARDIANCE Take 25 mg by mouth daily.   Insulin NPH (Human) (Isophane) 100 UNIT/ML Kiwkpen Commonly known as: HUMULIN N Inject into the skin.   lidocaine 5 % Commonly known as: Lidoderm Place 1 patch onto the skin every 12 (twelve) hours. Remove & Discard patch within 12 hours or as directed by MD   lisinopril 10 MG tablet Commonly known as: ZESTRIL Take 1 tablet (10 mg total) by mouth daily.   multivitamin with minerals Tabs tablet Take 1 tablet by mouth daily.   naproxen sodium 220 MG tablet Commonly known as: ALEVE Take 220 mg by mouth daily as needed (pain).   predniSONE 10 MG (21) Tbpk tablet Commonly known as: STERAPRED UNI-PAK 21 TAB Take as directed on package.  (60 mg po on day 1, 50 mg po on day 2...)       Allergies: No Known Allergies  Family History: Family History  Problem Relation Age of Onset  . Diabetes Sister        type 1  . Diabetes Mother   . Dementia Maternal Grandmother   . AAA (abdominal aortic aneurysm) Maternal Grandfather   . Hypertension Maternal Grandfather  Social History:  reports that he quit smoking about 5 years ago. His smoking use included cigarettes. He started smoking about 30 years ago. He has a 25.00 pack-year smoking history. He has never used smokeless tobacco. He reports current alcohol use of about 2.0 standard drinks of alcohol per week. He reports that he does not use drugs.   Physical Exam: BP (!) 131/95   Pulse 73   Ht 6\' 1"  (1.854 m)   Wt 275 lb (124.7 kg)   BMI 36.28 kg/m   Constitutional:  Alert and oriented, No acute distress. HEENT: Quail AT, moist mucus membranes.  Trachea midline, no  masses. Cardiovascular: No clubbing, cyanosis, or edema. Respiratory: Normal respiratory effort, no increased work of breathing. GI: Abdomen is soft, nontender, nondistended, no abdominal masses GU: Phallus without lesions, testes descended bilaterally without masses or tenderness Skin: No rashes, bruises or suspicious lesions. Neurologic: Grossly intact, no focal deficits, moving all 4 extremities. Psychiatric: Normal mood and affect.   Assessment & Plan:    1.  Erectile dysfunction  Significant organic risk factors  We discussed that approximately 70% of ED cases are related to arterial insufficiency  Unlikely improving testosterone level will make a significant impact in his erections  He was interested in pursuing intracavernosal injections  We discussed the possibility of priapism and stressed the need to call for an erection lasting >3 hours  Will schedule injection training with  Rx Trimix sent to [facility]  2.  Decreased libido  Schedule a.m. lab visit for testosterone free profile and LH   Carollee Herter, MD  [facility] [address] [place], [place] [phone]

## 2020-03-02 ENCOUNTER — Encounter: Payer: Self-pay | Admitting: Urology

## 2020-03-05 NOTE — Progress Notes (Signed)
Nathan Rojas presents today for a Trimix titration.  He is no longer spontaneous erections.   He has had poor response to PDE5i's.   He denies any history of sickle cell anemia or trait, a history of multiple myeloma or a history of leukemia.  He has not taken trazodone or a PDE5i's today.    Patient's left corpus cavernosum is identified.  An area near the base of the penis is cleansed with rubbing alcohol.  Careful to avoid the dorsal vein, 2 mcg of Trimix (papaverine 30 mg, phentolamine 1 mg and prostaglandin E1 10 mcg, Lot # 03/03/2020_0  exp # 04/17/2020) is injected at a 90 degree angle into the left corpus cavernosum near the base of the penis.  Patient experienced a very firm erection in 15 minutes.    Advised patient of the condition of priapism, painful erection lasting for more than four hours, and to contact the office immediately or seek treatment in the ED   We called Nathan Rojas this afternoon and he stated he was still erect.  He presented to the office and we were preparing to start phenylephrine injections, but the patient detumesced.    He will plan on injecting 1 mcg with his next injection.    I spent 15 minutes on the day of the encounter to include pre-visit record review, face-to-face time with the patient, and post-visit ordering of tests.

## 2020-03-06 ENCOUNTER — Encounter: Payer: Self-pay | Admitting: Urology

## 2020-03-06 ENCOUNTER — Telehealth: Payer: Self-pay | Admitting: Urology

## 2020-03-06 ENCOUNTER — Ambulatory Visit (INDEPENDENT_AMBULATORY_CARE_PROVIDER_SITE_OTHER): Payer: BC Managed Care – PPO | Admitting: Urology

## 2020-03-06 ENCOUNTER — Other Ambulatory Visit: Payer: Self-pay

## 2020-03-06 VITALS — BP 117/86 | HR 87 | Ht 73.0 in | Wt 275.0 lb

## 2020-03-06 DIAGNOSIS — N5201 Erectile dysfunction due to arterial insufficiency: Secondary | ICD-10-CM

## 2020-03-06 NOTE — Telephone Encounter (Signed)
Patient states he still has an erection and is coming to the office.

## 2020-03-06 NOTE — Patient Instructions (Signed)

## 2020-03-06 NOTE — Telephone Encounter (Signed)
Would you call Mr. Smethers and ask him how things went this morning?

## 2020-03-07 ENCOUNTER — Other Ambulatory Visit: Payer: BC Managed Care – PPO

## 2020-03-07 DIAGNOSIS — R6882 Decreased libido: Secondary | ICD-10-CM

## 2020-03-08 LAB — TESTOSTERONE FREE, PROFILE I
Albumin: 4.7 g/dL (ref 4.0–5.0)
Sex Hormone Binding: 27.5 nmol/L (ref 16.5–55.9)
Testost., Free, Calc: 67.6 pg/mL (ref 30.3–183.2)
Testosterone: 323 ng/dL (ref 264–916)

## 2020-03-08 LAB — LUTEINIZING HORMONE: LH: 5.4 m[IU]/mL (ref 1.7–8.6)

## 2020-03-10 ENCOUNTER — Encounter: Payer: Self-pay | Admitting: *Deleted

## 2020-03-30 ENCOUNTER — Emergency Department
Admission: EM | Admit: 2020-03-30 | Discharge: 2020-03-30 | Disposition: A | Discharge status: Home / Self Care | Payer: BC Managed Care – PPO | Attending: Emergency Medicine | Admitting: Emergency Medicine

## 2020-03-30 ENCOUNTER — Encounter: Payer: Self-pay | Admitting: Emergency Medicine

## 2020-03-30 DIAGNOSIS — N4830 Priapism, unspecified: Secondary | ICD-10-CM | POA: Diagnosis not present

## 2020-03-30 DIAGNOSIS — Z7982 Long term (current) use of aspirin: Secondary | ICD-10-CM | POA: Insufficient documentation

## 2020-03-30 DIAGNOSIS — N189 Chronic kidney disease, unspecified: Secondary | ICD-10-CM | POA: Diagnosis not present

## 2020-03-30 DIAGNOSIS — Z79899 Other long term (current) drug therapy: Secondary | ICD-10-CM | POA: Insufficient documentation

## 2020-03-30 DIAGNOSIS — Z794 Long term (current) use of insulin: Secondary | ICD-10-CM | POA: Insufficient documentation

## 2020-03-30 DIAGNOSIS — E1122 Type 2 diabetes mellitus with diabetic chronic kidney disease: Secondary | ICD-10-CM | POA: Diagnosis not present

## 2020-03-30 DIAGNOSIS — Z87891 Personal history of nicotine dependence: Secondary | ICD-10-CM | POA: Insufficient documentation

## 2020-03-30 DIAGNOSIS — I129 Hypertensive chronic kidney disease with stage 1 through stage 4 chronic kidney disease, or unspecified chronic kidney disease: Secondary | ICD-10-CM | POA: Diagnosis not present

## 2020-03-30 LAB — URINALYSIS, COMPLETE (UACMP) WITH MICROSCOPIC
Bacteria, UA: NONE SEEN
Bilirubin Urine: NEGATIVE
Glucose, UA: >=500 mg/dL — AB
Hgb urine dipstick: NEGATIVE
Ketones, ur: NEGATIVE mg/dL
Leukocytes,Ua: NEGATIVE
Nitrite: NEGATIVE
Protein, ur: NEGATIVE mg/dL
Specific Gravity, Urine: 1.028 (ref 1.005–1.030)
Squamous Epithelial / HPF: NONE SEEN (ref 0–5)
pH: 5 (ref 5.0–8.0)

## 2020-03-30 MED ORDER — PHENYLEPHRINE 200 MCG/ML FOR PRIAPISM / HYPOTENSION
100.0000 ug | Freq: Once | INTRAMUSCULAR | Status: AC
  Administered 2020-03-30: 100 ug via INTRACAVERNOUS
  Filled 2020-03-30: qty 50

## 2020-03-30 MED ORDER — HYDROCODONE-ACETAMINOPHEN 5-325 MG PO TABS: 1.0000 | ORAL_TABLET | Freq: Four times a day (QID) | ORAL | 0 refills | Status: DC | PRN

## 2020-03-30 NOTE — Consult Note (Signed)
     Urology Consult   I have been asked to see the patient by Dr. Lenard Lance, for evaluation and management of priapism.  Chief Complaint: Priapism  HPI:  Nathan Rojas is a 45 y.o. year old managed by Irineo Axon MD and Michiel Cowboy, PA in urology clinic for ED.  He is currently on Trimix penile injections for ED, and used 1.5 mcg of Trimix at 11:30 PM last night.  He has had a persistent erection since that time, and it has become painful over the last few hours.  He denies any other medication use, is not on blood thinners.  PMH: Past Medical History:  Diagnosis Date  . CKD (chronic kidney disease)   . Hyperlipidemia   . Hypertension   . MRSA infection around age 60   left hand wound  . Rheumatoid arthritis, 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HCC) 01/23/2016  . Trigeminal neuralgia 06/03/2015  . Type 2 diabetes mellitus, uncontrolled (HCC)     Surgical History: Past Surgical History:  Procedure Laterality Date  . APPENDECTOMY    . CHOLECYSTECTOMY N/A 05/06/2017   Procedure: LAPAROSCOPIC CHOLECYSTECTOMY;  Surgeon: Ancil Linsey, MD;  Location: [facility] ORS;  Service: General;  Laterality: N/A;  . TONSILLECTOMY    . VASECTOMY  Jan 2015    Allergies: No Known Allergies  Family History: Family History  Problem Relation Age of Onset  . Diabetes Sister        type 1  . Diabetes Mother   . Dementia Maternal Grandmother   . AAA (abdominal aortic aneurysm) Maternal Grandfather   . Hypertension Maternal Grandfather     Social History:  reports that he quit smoking about 5 years ago. His smoking use included cigarettes. He started smoking about 31 years ago. He has a 25.00 pack-year smoking history. He has never used smokeless tobacco. He reports current alcohol use of about 2.0 standard drinks of alcohol per week. He reports that he does not use drugs.  ROS: Negative aside from those stated in the HPI.  Physical Exam: BP 133/85 (BP Location: Right Arm)   Pulse 73   Temp 97.6 F (36.4  C) (Oral)   Resp 20   SpO2 94%    Constitutional:  Alert and oriented, No acute distress. Cardiovascular: No clubbing, cyanosis, or edema. Respiratory: Normal respiratory effort, no increased work of breathing. GI: Abdomen is soft, nontender, nondistended, no abdominal masses GU: Firm painful erection consistent with priapism Lymph: No cervical or inguinal lymphadenopathy. Skin: No rashes, bruises or suspicious lesions. Neurologic: Grossly intact, no focal deficits, moving all 4 extremities. Psychiatric: Normal mood and affect.  Assessment & Plan:   45 year old male with ED managed with Trimix who injected 1.5 mcg last night at 11:30 PM and presents this morning with a firm painful erection since that time.  Please see procedure note for full details of priapism takedown.  Ultimately required Winter shunt with an 18-gauge needle placed through the glans into the right corpora for resolution.  Recommendations: No sexual activity for at least 2 weeks, no Trimix injections Leave dressing in place until Tuesday morning, can shower then.  No tub baths for at least 2 weeks Follow-up with Michiel Cowboy to consider trial of Cialis or decreasing Trimix dosing(arranged)  Sondra Come, MD   [facility] [address] [place], [place] [address] [phone]

## 2020-03-30 NOTE — ED Provider Notes (Signed)
[facility] Emergency Department Provider Note  Time seen: 9:03 AM  I have reviewed the triage vital signs and the nursing notes.   HISTORY  Chief Complaint Groin Issue   HPI Nathan Rojas is a 45 y.o. male with a past medical history of CKD, hypertension, hyperlipidemia, diabetes, presents to the emergency department for priapism.  According to the patient he has been experiencing erectile dysfunction issues for which he follows up with Dr. Lonna Cobb.  Patient was prescribed Trimix injections.  States a month or so ago he used a 2 mcg dose which was too much and he had priapism issues.  His dose was decreased which he tried last night but he has had an erection since 11:30 PM.  Patient states he was able to urinate.  States it has become painful.   Past Medical History:  Diagnosis Date  . CKD (chronic kidney disease)   . Hyperlipidemia   . Hypertension   . MRSA infection around age 46   left hand wound  . Rheumatoid arthritis, 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HCC) 01/23/2016  . Trigeminal neuralgia 06/03/2015  . Type 2 diabetes mellitus, uncontrolled (HCC)     Patient Active Problem List   Diagnosis Date Noted  . Preventative health care 01/10/2018  . Erectile dysfunction 04/27/2017  . Abnormal liver diagnostic imaging 03/18/2017  . Vitamin D deficiency 07/09/2016  . Flexor tenosynovitis of thumb 05/27/2016  . Encounter for long-term (current) use of high-risk medication 03/05/2016  . Chronic pain of multiple joints 01/23/2016  . CRP elevated 01/23/2016  . Elevated rheumatoid factor 01/23/2016  . Rheumatoid arthritis, 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HCC) 01/23/2016  . Arthralgia of multiple joints 01/09/2016  . Trigeminal neuralgia 06/03/2015  . Medication monitoring encounter 04/18/2015  . Obesity 04/18/2015  . Kidney stone 11/22/2014  . Abdominal aortic atherosclerosis (HCC) 11/16/2014  . Type 2 diabetes mellitus, uncontrolled (HCC)   . Hypertension   . Hyperlipidemia   . CKD  (chronic kidney disease)   . Encounter for sterilization 04/20/2013    Past Surgical History:  Procedure Laterality Date  . APPENDECTOMY    . CHOLECYSTECTOMY N/A 05/06/2017   Procedure: LAPAROSCOPIC CHOLECYSTECTOMY;  Surgeon: Ancil Linsey, MD;  Location: [facility] ORS;  Service: General;  Laterality: N/A;  . TONSILLECTOMY    . VASECTOMY  Jan 2015    Prior to Admission medications   Medication Sig Start Date End Date Taking? Authorizing Provider  AMBULATORY NON FORMULARY MEDICATION Trimix (30/1/10)-(Pap/Phent/PGE)  Test Dose  76ml vial   Qty #3 Refills 0  [facility] [phone] Fax [phone] 02/25/20   Riki Altes, MD  aspirin EC 81 MG tablet Take 81 mg by mouth daily.    [provider]  atorvastatin (LIPITOR) 20 MG tablet Take 1 tablet (20 mg total) by mouth at bedtime. 01/22/20   Danelle Berry, PA-C  cholecalciferol (VITAMIN D) 1000 units tablet Take 1 tablet (1,000 Units total) by mouth daily. 01/04/18   Kerman Passey, MD  empagliflozin (JARDIANCE) 25 MG TABS tablet Take 25 mg by mouth daily. 01/24/18   [provider]  Insulin NPH, Human,, Isophane, (HUMULIN N) 100 UNIT/ML Kiwkpen Inject into the skin.    [provider]  lisinopril (ZESTRIL) 10 MG tablet Take 1 tablet (10 mg total) by mouth daily. 01/22/20   Danelle Berry, PA-C  Multiple Vitamin (MULTIVITAMIN WITH MINERALS) TABS tablet Take 1 tablet by mouth daily.    [provider]    No Known Allergies  Family History  Problem  Relation Age of Onset  . Diabetes Sister        type 1  . Diabetes Mother   . Dementia Maternal Grandmother   . AAA (abdominal aortic aneurysm) Maternal Grandfather   . Hypertension Maternal Grandfather     Social History Social History   Tobacco Use  . Smoking status: Former Smoker    Packs/day: 1.00    Years: 25.00    Pack years: 25.00    Types: Cigarettes    Start date: 03/29/1989    Quit date: 02/08/2015    Years since quitting:  5.1  . Smokeless tobacco: Never Used  Vaping Use  . Vaping Use: Never used  Substance Use Topics  . Alcohol use: Yes    Alcohol/week: 2.0 standard drinks    Types: 1 Shots of liquor, 1 Cans of beer per week    Comment: 1-2 drinks/week  . Drug use: No    Review of Systems Constitutional: Negative for fever. Cardiovascular: Negative for chest pain. Respiratory: Negative for shortness of breath. Gastrointestinal: Negative for abdominal pain Genitourinary: Erections since 11:30 PM.  Moderate discomfort currently. Musculoskeletal: Negative for musculoskeletal complaints Neurological: Negative for headache All other ROS negative  ____________________________________________   PHYSICAL EXAM:  VITAL SIGNS: ED Triage Vitals [03/30/20 0454]  Enc Vitals Group     BP (!) 140/101     Pulse Rate 74     Resp 18     Temp 97.6 F (36.4 C)     Temp Source Oral     SpO2 97 %     Weight      Height      Head Circumference      Peak Flow      Pain Score      Pain Loc      Pain Edu?      Excl. in [place]?    Constitutional: Alert and oriented. Well appearing and in no distress. Eyes: Normal exam ENT      Head: Normocephalic and atraumatic.      Mouth/Throat: Mucous membranes are moist. Cardiovascular: Normal rate, regular rhythm. Respiratory: Normal respiratory effort without tachypnea nor retractions. Breath sounds are clear Gastrointestinal: Soft and nontender. No distention.   Genitourinary: Patient currently has an erection, mild tenderness palpation.  No skin color changes. Musculoskeletal: Nontender with normal range of motion in all extremities.  Neurologic:  Normal speech and language. No gross focal neurologic deficits Skin:  Skin is warm, dry and intact.  Psychiatric: Mood and affect are normal.   ____________________________________________   INITIAL IMPRESSION / ASSESSMENT AND PLAN / ED COURSE  Pertinent labs & imaging results that were available during my care of  the patient were reviewed by me and considered in my medical decision making (see chart for details).   Patient presents to the emergency department for priapism.  He was a Trimix injection last night around 11:30 PM has continued to have an erection ever since.  Now states moderate discomfort.  Exam otherwise benign.  Patient was able to urinate.  Urinalysis is normal.  I have spoke to Dr. Diamantina Providence who will see the patient to perform phenylephrine injection.  Patient agreeable to plan of care.  Dr. Diamantina Providence has performed the phenylephrine injection.  Rechecked after 2 hours, remains flaccid.  Patient will be discharged home.  Patient will follow up in the office.  Nathan Rojas was evaluated in Emergency Department on 03/30/2020 for the symptoms described in the history of present illness. He was  evaluated in the context of the global COVID-19 pandemic, which necessitated consideration that the patient might be at risk for infection with the SARS-CoV-2 virus that causes COVID-19. Institutional protocols and algorithms that pertain to the evaluation of patients at risk for COVID-19 are in a state of rapid change based on information released by regulatory bodies including the CDC and federal and state organizations. These policies and algorithms were followed during the patient's care in the ED.  ____________________________________________   FINAL CLINICAL IMPRESSION(S) / ED DIAGNOSES  Priapism   Minna Antis, MD 03/30/20 1212

## 2020-03-30 NOTE — ED Triage Notes (Signed)
Pt reports he injected 1.8mcg Trimix to the base of penis at 2330 and has since had erect penis. Pt reports he was recently seen and the medication was lowered from to 1.21mcg. Per note pt was to administer .

## 2020-03-30 NOTE — ED Notes (Signed)
Urology at bedside at this time.

## 2020-03-30 NOTE — Discharge Instructions (Signed)
No sexual activity for at least 2 weeks, no Trimix injections Leave dressing in place until Tuesday morning, can shower then.  No tub baths for at least 2 weeks Follow-up with Michiel Cowboy to consider trial of Cialis or decreasing Trimix dosing(arranged)

## 2020-03-30 NOTE — ED Notes (Signed)
Pt placed on card monitor. °

## 2020-03-30 NOTE — Procedures (Signed)
PATIENT:  Nathan Rojas  45 y.o. male  PRE-procedure DIAGNOSIS: Priapism  POST-procedure DIAGNOSIS: Priapism  PROCEDURE:  1.  Injection of corporal cavernosa phenylephrine, irrigation, aspiration 2.  Winter shunt for persistent priapism  ANESTHESIA: Local anesthetic  EBL: 75 mL  Drains: None  Specimen: None  Findings:  1.  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  We discussed the risks of bleeding, infection, ED, persistent priapism requiring operative repair shunting.  He was placed on telemetry and blood pressure was monitored serially.  The penis and suprapubic region were prepped and draped in standard sterile fashion.  A penile ring block was performed using 1% lidocaine without epinephrine.  A 21 French butterfly needle was placed in the right corpora and 56mL phenylephrine(200 mcg/mL) was injected.  An 18-gauge needle was placed in the corpora and dark venous blood was drained.  The penis continue to return to a erect position, and a total of 5 mL phenylephrine was used over period of 25 minutes.    At this point with his persistent erection, I opted to perform a Winter shunt at the bedside with an 18-gauge needle.  This was advanced from the glans into the right corpora with return of dark red venous blood.  The penis remained flaccid.  The 18-gauge needle was removed and a 4-0 chromic stitch was used for persistent oozing at the glans.  A sterile dressing of gauze and [place] was applied.  I returned 60 minutes later, and the penis remained flaccid and nonpainful.  Legrand Rams, MD 03/30/2020

## 2020-04-08 NOTE — Progress Notes (Signed)
04/09/2020 11:27 AM   Nathan Rojas [birth year] [id number]  Referring provider: Delsa Grana, PA-C [address] [place],  [place] [address]  Chief Complaint  Patient presents with   Follow-up   Urological history 1. ED  Several year history of ED however worse the last 1-2 years  Using sildenafil which was initially effective however recently has not been effective even with increasing to 150 mg  Significant organic risk factors including diabetes, hypertension, hyperlipidemia, antihypertensive medication and 1-1.5 pack/day x 30 years smoking history (quit 6 years ago)  No pain or curvature with erections  Occasionally can get erections firm enough for penetration however difficulty maintaining; other times he has difficulty achieving an erection  Prolonged erection with 2 mcg of Trimix (30/1/10)  Recently treated in the ED for prolonged priapism  HPI: Nathan Rojas is a 45 year old male who presents today for follow up after being treated for a priapism in the ED on 03/30/2020.  He injected 1.5 mcg of Trimix on 03/29/2020 at 11 pm and experienced a firm painful erection since that time until he was seen at 9 am on 03/30/2020.  Dr. Diamantina Providence was consulted and had to perform a Winter shunt after injection of corporal cavernosa phenylephrine, irrigation and aspiration were not successful for priapism take down.  Detumescence was able to be achieved after Winter shunt was completed.  He has had no issues since the shunt.  PMH: Past Medical History:  Diagnosis Date   CKD (chronic kidney disease)    Hyperlipidemia    Hypertension    MRSA infection around age 60   left hand wound   Rheumatoid arthritis, 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[place]) 01/23/2016   Trigeminal neuralgia 06/03/2015   Type 2 diabetes mellitus, uncontrolled ([place])     Surgical History: Past Surgical History:  Procedure Laterality Date   APPENDECTOMY     CHOLECYSTECTOMY N/A 05/06/2017   Procedure:  LAPAROSCOPIC CHOLECYSTECTOMY;  Surgeon: Vickie Epley, MD;  Location: [facility] ORS;  Service: General;  Laterality: N/A;   TONSILLECTOMY     VASECTOMY  Jan 2015    Home Medications:  Allergies as of 04/09/2020   No Known Allergies     Medication List       Accurate as of April 09, 2020 11:27 AM. If you have any questions, ask your nurse or doctor.        AMBULATORY NON FORMULARY MEDICATION Trimix (30/1/10)-(Pap/Phent/PGE)  Test Dose  22m vial   Qty #3 Refills 0  [facility] [phone] [phone]  aspirin EC 81 MG tablet Take 81 mg by mouth daily.   atorvastatin 20 MG tablet Commonly known as: LIPITOR Take 1 tablet (20 mg total) by mouth at bedtime.   cholecalciferol 25 MCG (1000 UNIT) tablet Commonly known as: VITAMIN D Take 1 tablet (1,000 Units total) by mouth daily.   empagliflozin 25 MG Tabs tablet Commonly known as: JARDIANCE Take 25 mg by mouth daily.   HYDROcodone-acetaminophen 5-325 MG tablet Commonly known as: NORCO/VICODIN Take 1 tablet by mouth every 6 (six) hours as needed for moderate pain.   Insulin NPH (Human) (Isophane) 100 UNIT/ML Kiwkpen Commonly known as: HUMULIN N Inject into the skin.   lisinopril 10 MG tablet Commonly known as: ZESTRIL Take 1 tablet (10 mg total) by mouth daily.   multivitamin with minerals Tabs tablet Take 1 tablet by mouth daily.   tadalafil 5 MG tablet Commonly known as: Cialis Take 1 tablet (5 mg total) by mouth  daily as needed for erectile dysfunction. Started by: Zara Council, PA-C   tadalafil 20 MG tablet Commonly known as: CIALIS Take 1 tablet (20 mg total) by mouth daily as needed for erectile dysfunction. Started by: Zara Council, PA-C       Allergies: No Known Allergies  Family History: Family History  Problem Relation Age of Onset   Diabetes Sister        type 1   Diabetes Mother    Dementia Maternal Grandmother    AAA (abdominal aortic aneurysm) Maternal  Grandfather    Hypertension Maternal Grandfather     Social History:  reports that he quit smoking about 5 years ago. His smoking use included cigarettes. He started smoking about 31 years ago. He has a 25.00 pack-year smoking history. He has never used smokeless tobacco. He reports current alcohol use of about 2.0 standard drinks of alcohol per week. He reports that he does not use drugs.  ROS: Pertinent ROS in HPI  Physical Exam: BP 120/82    Pulse 96    Ht '6\' 1"'  (1.854 m)    Wt 275 lb (124.7 kg)    BMI 36.28 kg/m   Constitutional:  Well nourished. Alert and oriented, No acute distress. HEENT: [place] AT, mask in place.  Trachea midline Cardiovascular: No clubbing, cyanosis, or edema. Respiratory: Normal respiratory effort, no increased work of breathing. Neurologic: Grossly intact, no focal deficits, moving all 4 extremities. Psychiatric: Normal mood and affect.  Laboratory Data: Lab Results  Component Value Date   WBC 5.7 01/22/2020   HGB 15.2 01/22/2020   HCT 44.4 01/22/2020   MCV 87.1 01/22/2020   PLT 244 01/22/2020    Lab Results  Component Value Date   CREATININE 0.86 01/22/2020    Lab Results  Component Value Date   TESTOSTERONE 323 03/07/2020    Lab Results  Component Value Date   HGBA1C 9.4 (H) 04/07/2018    Lab Results  Component Value Date   TSH 1.50 01/04/2018       Component Value Date/Time   CHOL 179 01/22/2020 1120   CHOL 107 09/05/2015 1026   HDL 27 (L) 01/22/2020 1120   HDL 25 (L) 09/05/2015 1026   CHOLHDL 6.6 (H) 01/22/2020 1120   VLDL 35 (H) 07/09/2016 1016   LDLCALC 107 (H) 01/22/2020 1120    Lab Results  Component Value Date   AST 31 01/22/2020   Lab Results  Component Value Date   ALT 47 (H) 01/22/2020    Urinalysis    Component Value Date/Time   COLORURINE STRAW (A) 03/30/2020 0717   APPEARANCEUR CLEAR (A) 03/30/2020 0717   APPEARANCEUR Clear 04/18/2015 0911   LABSPEC 1.028 03/30/2020 0717   PHURINE 5.0 03/30/2020 0717    GLUCOSEU >=500 (A) 03/30/2020 0717   HGBUR NEGATIVE 03/30/2020 0717   BILIRUBINUR NEGATIVE 03/30/2020 0717   BILIRUBINUR Negative 04/18/2015 0911   KETONESUR NEGATIVE 03/30/2020 0717   PROTEINUR NEGATIVE 03/30/2020 0717   NITRITE NEGATIVE 03/30/2020 0717   LEUKOCYTESUR NEGATIVE 03/30/2020 0717   Specimen:  Blood  Ref Range & Units 3 mo ago  Hemoglobin A1C 4.2 - 5.6 % 9.5High   Average Blood Glucose (Calc) mg/dL [facility] - LAB   Narrative Performed by [facility] - LAB Normal Range:  4.2 - 5.6%  Increased Risk: 5.7 - 6.4%  Diabetes:    >= 6.5%  Glycemic Control for adults with diabetes: <7%  Specimen Collected: 01/07/20 2:56  PM Last Resulted: 01/07/20 3:11 PM  Received From: [facility]  Result Received: 01/21/20 8:11 AM   Specimen:  Blood  Ref Range & Units 3 mo ago  Glucose 70 - 110 mg/dL 172High   Sodium 136 - 145 mmol/L 136   Potassium 3.6 - 5.1 mmol/L 4.3   Chloride 97 - 109 mmol/L 104   Carbon Dioxide (CO2) 22.0 - 32.0 mmol/L 25.4   Urea Nitrogen (BUN) 7 - 25 mg/dL 16   Creatinine 0.7 - 1.3 mg/dL 0.8   Glomerular Filtration Rate (eGFR), MDRD Estimate >60 mL/min/1.73sq m 104   Calcium 8.7 - 10.3 mg/dL 9.2   AST  8 - 39 U/L 26   ALT  6 - 57 U/L 47   Alk Phos (alkaline Phosphatase) 34 - 104 U/L 56   Albumin 3.5 - 4.8 g/dL 4.5   Bilirubin, Total 0.3 - 1.2 mg/dL 0.8   Protein, Total 6.1 - 7.9 g/dL 6.9   A/G Ratio 1.0 - 5.0 gm/dL 1.9   Resulting Agency  [facility] - [facility]  Specimen Collected: 01/07/20 2:52 PM Last Resulted: 01/07/20 4:01 PM  Received From: [facility]  Result Received: 01/21/20 8:11 AM    I have reviewed the labs.   Pertinent Imaging: No recent imaging  Assessment & Plan:    1. Priapism Treated with a Winter shunt in ED   2. ED We discussed how to proceed going forward and he would like a trial of tadalafil.  So we will have him take  5 mg of tadalafil daily and take 20 mg on the day he would like to have intercourse to see if he can have a satisfactory erection with the medication.  If not, we can try the injections again at a lower dose on the morning that we are in clinic so that if he should experience a priapism we are available to address it in the office.  Return for patient to call for follow up .  These notes generated with voice recognition software. I apologize for typographical errors.  Zara Council, PA-C  [facility] [address]  [place], [place] [address] [phone]

## 2020-04-09 ENCOUNTER — Encounter: Payer: Self-pay | Admitting: Urology

## 2020-04-09 ENCOUNTER — Other Ambulatory Visit: Payer: Self-pay

## 2020-04-09 ENCOUNTER — Ambulatory Visit (INDEPENDENT_AMBULATORY_CARE_PROVIDER_SITE_OTHER): Payer: BC Managed Care – PPO | Admitting: Urology

## 2020-04-09 VITALS — BP 120/82 | HR 96 | Ht 73.0 in | Wt 275.0 lb

## 2020-04-09 DIAGNOSIS — N4830 Priapism, unspecified: Secondary | ICD-10-CM

## 2020-04-09 DIAGNOSIS — N5201 Erectile dysfunction due to arterial insufficiency: Secondary | ICD-10-CM

## 2020-04-09 MED ORDER — TADALAFIL 5 MG PO TABS: 5.0000 mg | ORAL_TABLET | Freq: Every day | ORAL | 3 refills | Status: DC | PRN

## 2020-04-09 MED ORDER — TADALAFIL 20 MG PO TABS
20.0000 mg | ORAL_TABLET | Freq: Every day | ORAL | 0 refills | Status: DC | PRN
Start: 2020-04-09 — End: 2020-04-10

## 2020-04-10 ENCOUNTER — Other Ambulatory Visit: Payer: Self-pay

## 2020-04-10 MED ORDER — TADALAFIL 5 MG PO TABS: 5.0000 mg | ORAL_TABLET | Freq: Every day | ORAL | 3 refills | Status: DC | PRN

## 2020-04-10 MED ORDER — TADALAFIL 20 MG PO TABS: 20.0000 mg | ORAL_TABLET | Freq: Every day | ORAL | 3 refills | Status: DC | PRN

## 2020-04-10 NOTE — Telephone Encounter (Signed)
Pt called asking to send his medication to [facility] as it is a lot cheaper, adivsed pt I will send cialis to [facility]. Pt aware. Sent to pharm.

## 2020-04-17 DIAGNOSIS — E1159 Type 2 diabetes mellitus with other circulatory complications: Secondary | ICD-10-CM | POA: Diagnosis not present

## 2020-04-17 DIAGNOSIS — E669 Obesity, unspecified: Secondary | ICD-10-CM | POA: Diagnosis not present

## 2020-04-17 DIAGNOSIS — E1169 Type 2 diabetes mellitus with other specified complication: Secondary | ICD-10-CM | POA: Diagnosis not present

## 2020-04-17 DIAGNOSIS — Z794 Long term (current) use of insulin: Secondary | ICD-10-CM | POA: Diagnosis not present

## 2020-04-17 DIAGNOSIS — E785 Hyperlipidemia, unspecified: Secondary | ICD-10-CM | POA: Diagnosis not present

## 2020-04-17 DIAGNOSIS — E1165 Type 2 diabetes mellitus with hyperglycemia: Secondary | ICD-10-CM | POA: Diagnosis not present

## 2020-04-17 LAB — HEMOGLOBIN A1C: Hemoglobin A1C: 8.2

## 2020-04-21 ENCOUNTER — Other Ambulatory Visit: Payer: Self-pay

## 2020-04-21 DIAGNOSIS — IMO0002 Reserved for concepts with insufficient information to code with codable children: Secondary | ICD-10-CM

## 2020-04-21 DIAGNOSIS — I10 Essential (primary) hypertension: Secondary | ICD-10-CM

## 2020-04-21 DIAGNOSIS — E782 Mixed hyperlipidemia: Secondary | ICD-10-CM

## 2020-04-21 DIAGNOSIS — E1165 Type 2 diabetes mellitus with hyperglycemia: Secondary | ICD-10-CM

## 2020-04-21 MED ORDER — ATORVASTATIN CALCIUM 20 MG PO TABS
20.0000 mg | ORAL_TABLET | Freq: Every day | ORAL | 1 refills | Status: DC
Start: 2020-04-21 — End: 2020-10-03

## 2020-04-21 MED ORDER — LISINOPRIL 10 MG PO TABS: 10.0000 mg | ORAL_TABLET | Freq: Every day | ORAL | 1 refills | Status: DC

## 2020-04-29 ENCOUNTER — Other Ambulatory Visit: Payer: Self-pay

## 2020-04-29 ENCOUNTER — Ambulatory Visit (INDEPENDENT_AMBULATORY_CARE_PROVIDER_SITE_OTHER): Payer: BC Managed Care – PPO | Admitting: Family Medicine

## 2020-04-29 ENCOUNTER — Encounter: Payer: Self-pay | Admitting: Family Medicine

## 2020-04-29 VITALS — BP 134/86 | HR 88 | Temp 98.0°F | Resp 16 | Ht 73.0 in | Wt 276.5 lb

## 2020-04-29 DIAGNOSIS — IMO0002 Reserved for concepts with insufficient information to code with codable children: Secondary | ICD-10-CM

## 2020-04-29 DIAGNOSIS — G8929 Other chronic pain: Secondary | ICD-10-CM

## 2020-04-29 DIAGNOSIS — I10 Essential (primary) hypertension: Secondary | ICD-10-CM | POA: Diagnosis not present

## 2020-04-29 DIAGNOSIS — E782 Mixed hyperlipidemia: Secondary | ICD-10-CM

## 2020-04-29 DIAGNOSIS — E1165 Type 2 diabetes mellitus with hyperglycemia: Secondary | ICD-10-CM

## 2020-04-29 DIAGNOSIS — I700 Atherosclerosis of aorta: Secondary | ICD-10-CM | POA: Diagnosis not present

## 2020-04-29 DIAGNOSIS — N529 Male erectile dysfunction, unspecified: Secondary | ICD-10-CM

## 2020-04-29 DIAGNOSIS — M25512 Pain in left shoulder: Secondary | ICD-10-CM

## 2020-04-29 DIAGNOSIS — M059 Rheumatoid arthritis with rheumatoid factor, unspecified: Secondary | ICD-10-CM

## 2020-04-29 DIAGNOSIS — M25511 Pain in right shoulder: Secondary | ICD-10-CM

## 2020-04-29 DIAGNOSIS — R7989 Other specified abnormal findings of blood chemistry: Secondary | ICD-10-CM

## 2020-04-29 DIAGNOSIS — Z1211 Encounter for screening for malignant neoplasm of colon: Secondary | ICD-10-CM

## 2020-04-29 DIAGNOSIS — Z794 Long term (current) use of insulin: Secondary | ICD-10-CM

## 2020-04-29 LAB — COMPLETE METABOLIC PANEL WITH GFR
AG Ratio: 1.9 (calc) (ref 1.0–2.5)
ALT: 44 U/L (ref 9–46)
AST: 27 U/L (ref 10–40)
Albumin: 4.9 g/dL (ref 3.6–5.1)
Alkaline phosphatase (APISO): 57 U/L (ref 36–130)
BUN: 20 mg/dL (ref 7–25)
CO2: 28 mmol/L (ref 20–32)
Calcium: 9.9 mg/dL (ref 8.6–10.3)
Chloride: 98 mmol/L (ref 98–110)
Creat: 0.94 mg/dL (ref 0.60–1.35)
GFR, Est African American: 112 mL/min/{1.73_m2} (ref >=60)
GFR, Est Non African American: 97 mL/min/{1.73_m2} (ref >=60)
Globulin: 2.6 g/dL (calc) (ref 1.9–3.7)
Glucose, Bld: 107 mg/dL — ABNORMAL HIGH (ref 65–99)
Potassium: 4.5 mmol/L (ref 3.5–5.3)
Sodium: 135 mmol/L (ref 135–146)
Total Bilirubin: 1.8 mg/dL — ABNORMAL HIGH (ref 0.2–1.2)
Total Protein: 7.5 g/dL (ref 6.1–8.1)

## 2020-04-29 LAB — LIPID PANEL
Cholesterol: 132 mg/dL (ref <200)
HDL: 30 mg/dL — ABNORMAL LOW (ref >=40)
LDL Cholesterol (Calc): 79 mg/dL (calc)
Non-HDL Cholesterol (Calc): 102 mg/dL (calc) (ref <130)
Total CHOL/HDL Ratio: 4.4 (calc) (ref <5.0)
Triglycerides: 129 mg/dL (ref <150)

## 2020-04-29 NOTE — Progress Notes (Signed)
Name: Nathan Rojas   MRN: [id number]    DOB: 12-15-1975   Date:04/29/2020       Progress Note  Chief Complaint  Patient presents with  . Follow-up  . Hypertension  . Hyperlipidemia  . Diabetes     Subjective:   Nathan Rojas is a 45 y.o. male, presents to clinic for routine f/up  He resumed his medications including statin, DM meds and HTN meds Here for repeat labs for lipids and chemistry  DM:   Managing with specialists -  Lab Results  Component Value Date   HGBA1C 8.2 04/17/2020   HGBA1C 9.4 (H) 04/07/2018   HGBA1C 12.5 (A) 01/04/2018   HGBA1C 12.5 01/04/2018   HGBA1C 12.5 (A) 01/04/2018   HGBA1C 12.5 (A) 01/04/2018   Seeing urology for ED, low T, had ER visit for priapism, is now only being treated with cialis, no injections, and low T did not meet insurance requirement for them to pay for testosterone replacement meds  Hypertension:  Currently managed on lisinopril 10  Pt reports good med compliance and denies any SE.   Blood pressure today is well controlled. BP Readings from Last 3 Encounters:  04/29/20 134/86  04/09/20 120/82  03/30/20 124/78   Pt denies CP, SOB, exertional sx, LE edema, palpitation, Ha's, visual disturbances, lightheadedness, hypotension, syncope. Trying to be healthier, eat better  Hyperlipidemia: Currently treated with lipitor 20 mg, pt reports good med compliance Last Lipids: Lab Results  Component Value Date   CHOL 179 01/22/2020   HDL 27 (L) 01/22/2020   LDLCALC 107 (H) 01/22/2020   TRIG 340 (H) 01/22/2020   CHOLHDL 6.6 (H) 01/22/2020   - Denies: Chest pain, shortness of breath, myalgias, claudication  Since restarting all his meds he's had alternating shoulder pain -he has a history of RA which in the past has affected d his joints he has not had any injury or strenuous activity to either shoulder, he started all of his medications again at the same time and also has made some recent diet changes He denies any myalgias  body aches severe fatigue He did have elevated LFTs with his last labs and is due to recheck that His RA symptoms have been well controlled and he has not been on any immunosuppressive medications for several years   Current Outpatient Medications:  .  aspirin EC 81 MG tablet, Take 81 mg by mouth daily., Disp: , Rfl:  .  atorvastatin (LIPITOR) 20 MG tablet, Take 1 tablet (20 mg total) by mouth at bedtime., Disp: 90 tablet, Rfl: 1 .  cholecalciferol (VITAMIN D) 1000 units tablet, Take 1 tablet (1,000 Units total) by mouth daily., Disp: , Rfl:  .  empagliflozin (JARDIANCE) 25 MG TABS tablet, Take 25 mg by mouth daily., Disp: , Rfl:  .  Insulin NPH, Human,, Isophane, (HUMULIN N) 100 UNIT/ML Kiwkpen, Inject into the skin., Disp: , Rfl:  .  lisinopril (ZESTRIL) 10 MG tablet, Take 1 tablet (10 mg total) by mouth daily., Disp: 90 tablet, Rfl: 1 .  Multiple Vitamin (MULTIVITAMIN WITH MINERALS) TABS tablet, Take 1 tablet by mouth daily., Disp: , Rfl:  .  tadalafil (CIALIS) 20 MG tablet, Take 1 tablet (20 mg total) by mouth daily as needed for erectile dysfunction., Disp: 30 tablet, Rfl: 3 .  tadalafil (CIALIS) 5 MG tablet, Take 1 tablet (5 mg total) by mouth daily as needed for erectile dysfunction., Disp: 30 tablet, Rfl: 3 .  AMBULATORY NON FORMULARY MEDICATION, Trimix (30/1/10)-(Pap/Phent/PGE)  Test Dose  81ml vial   Qty #3 Refills 0  [facility] [phone] Fax [phone] (Patient not taking: Reported on 04/29/2020), Disp: 3 mL, Rfl: 0 .  HYDROcodone-acetaminophen (NORCO/VICODIN) 5-325 MG tablet, Take 1 tablet by mouth every 6 (six) hours as needed for moderate pain. (Patient not taking: Reported on 04/29/2020), Disp: 10 tablet, Rfl: 0  Patient Active Problem List   Diagnosis Date Noted  . Preventative health care 01/10/2018  . Erectile dysfunction 04/27/2017  . Abnormal liver diagnostic imaging 03/18/2017  . Vitamin D deficiency 07/09/2016  . Flexor tenosynovitis of thumb 05/27/2016  .  Encounter for long-term (current) use of high-risk medication 03/05/2016  . Chronic pain of multiple joints 01/23/2016  . CRP elevated 01/23/2016  . Elevated rheumatoid factor 01/23/2016  . Rheumatoid arthritis, 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HCC) 01/23/2016  . Arthralgia of multiple joints 01/09/2016  . Trigeminal neuralgia 06/03/2015  . Medication monitoring encounter 04/18/2015  . Obesity 04/18/2015  . Kidney stone 11/22/2014  . Abdominal aortic atherosclerosis (HCC) 11/16/2014  . Type 2 diabetes mellitus, uncontrolled (HCC)   . Hypertension   . Hyperlipidemia   . CKD (chronic kidney disease)   . Encounter for sterilization 04/20/2013    Past Surgical History:  Procedure Laterality Date  . APPENDECTOMY    . CHOLECYSTECTOMY N/A 05/06/2017   Procedure: LAPAROSCOPIC CHOLECYSTECTOMY;  Surgeon: Ancil Linsey, MD;  Location: [facility] ORS;  Service: General;  Laterality: N/A;  . TONSILLECTOMY    . VASECTOMY  Jan 2015    Family History  Problem Relation Age of Onset  . Diabetes Sister        type 1  . Diabetes Mother   . Dementia Maternal Grandmother   . AAA (abdominal aortic aneurysm) Maternal Grandfather   . Hypertension Maternal Grandfather     Social History   Tobacco Use  . Smoking status: Former Smoker    Packs/day: 1.00    Years: 25.00    Pack years: 25.00    Types: Cigarettes    Start date: 03/29/1989    Quit date: 02/08/2015    Years since quitting: 5.2  . Smokeless tobacco: Never Used  Vaping Use  . Vaping Use: Never used  Substance Use Topics  . Alcohol use: Yes    Alcohol/week: 2.0 standard drinks    Types: 1 Shots of liquor, 1 Cans of beer per week    Comment: 1-2 drinks/week  . Drug use: No     No Known Allergies  Health Maintenance  Topic Date Due  . COLONOSCOPY (Pts 45-11yrs Insurance coverage will need to be confirmed)  Never done  . FOOT EXAM  04/08/2019  . OPHTHALMOLOGY EXAM  05/11/2019  . COVID-19 Vaccine (1) 05/15/2020 (Originally [birth year]/1978)  . INFLUENZA  VACCINE  06/26/2020 (Originally 10/28/2019)  . TETANUS/TDAP  04/29/2021 (Originally 03/29/2020)  . HEMOGLOBIN A1C  10/15/2020  . PNEUMOCOCCAL POLYSACCHARIDE VACCINE AGE 4-64 HIGH RISK  Completed  . Hepatitis C Screening  Completed  . HIV Screening  Completed    Chart Review Today: I personally reviewed active problem list, medication list, allergies, family history, social history, health maintenance, notes from last encounter, lab results, imaging with the patient/caregiver today. ER visit, specialists visits and labs all reviewed personally today and with the patient  Review of Systems  Constitutional: Negative.   HENT: Negative.   Eyes: Negative.   Respiratory: Negative.   Cardiovascular: Negative.   Gastrointestinal: Negative.   Endocrine: Negative.   Genitourinary: Negative.   Musculoskeletal: Negative.   Skin:  Negative.   Allergic/Immunologic: Negative.   Neurological: Negative.   Hematological: Negative.   Psychiatric/Behavioral: Negative.   All other systems reviewed and are negative.    Objective:   Vitals:   04/29/20 1000  BP: 134/86  Pulse: 88  Resp: 16  Temp: 98 F (36.7 C)  TempSrc: Oral  SpO2: 96%  Weight: 276 lb 8 oz (125.4 kg)  Height: 6\' 1"  (1.854 m)    Body mass index is 36.48 kg/m.  Physical Exam Vitals and nursing note reviewed.  Constitutional:      General: He is not in acute distress.    Appearance: Normal appearance. He is well-developed. He is obese. He is not ill-appearing, toxic-appearing or diaphoretic.     Interventions: Face mask in place.  HENT:     Head: Normocephalic and atraumatic.     Jaw: No trismus.     Right Ear: External ear normal.     Left Ear: External ear normal.  Eyes:     General: Lids are normal. No scleral icterus.       Right eye: No discharge.        Left eye: No discharge.     Conjunctiva/sclera: Conjunctivae normal.  Neck:     Trachea: Trachea and phonation normal. No tracheal deviation.  Cardiovascular:      Rate and Rhythm: Normal rate and regular rhythm.     Pulses: Normal pulses.          Radial pulses are 2+ on the right side and 2+ on the left side.       Posterior tibial pulses are 2+ on the right side and 2+ on the left side.     Heart sounds: Normal heart sounds. No murmur heard. No friction rub. No gallop.   Pulmonary:     Effort: Pulmonary effort is normal. No respiratory distress.     Breath sounds: Normal breath sounds. No stridor. No wheezing, rhonchi or rales.  Abdominal:     General: Bowel sounds are normal. There is no distension.     Palpations: Abdomen is soft.     Tenderness: There is no abdominal tenderness.  Musculoskeletal:     Right lower leg: No edema.     Left lower leg: No edema.  Skin:    General: Skin is warm and dry.     Coloration: Skin is not jaundiced.     Findings: No rash.     Nails: There is no clubbing.  Neurological:     Mental Status: He is alert. Mental status is at baseline.     Cranial Nerves: No dysarthria or facial asymmetry.     Motor: No tremor or abnormal muscle tone.     Gait: Gait normal.  Psychiatric:        Mood and Affect: Mood normal.        Speech: Speech normal.        Behavior: Behavior normal. Behavior is cooperative.       Diabetic Foot Exam - Simple   Simple Foot Form Diabetic Foot exam was performed with the following findings: Yes 04/29/2020 10:37 AM  Visual Inspection Sensation Testing Pulse Check Comments       Assessment & Plan:     ICD-10-CM   1. Mixed hyperlipidemia  E78.2 Lipid panel    COMPLETE METABOLIC PANEL WITH GFR   back on meds, doing well, due for recheck of labs, he is going to continue to monitor for possible side effects  2. Essential  hypertension  I10 COMPLETE METABOLIC PANEL WITH GFR   He restarted medications, blood pressure is stable, well controlled and BP at goal today  3. Insulin dependent type 2 diabetes mellitus, uncontrolled (HCC)  E11.65 COMPLETE METABOLIC PANEL WITH GFR    Z79.4    Per specialist, he is working diligently on his diet and he believes his blood sugars are getting better  4. Abdominal aortic atherosclerosis (HCC)  I70.0 COMPLETE METABOLIC PANEL WITH GFR   Back on statin, monitoring  5. Low testosterone  R79.89    Per specialist, last repeated testosterone level was increased and he is not currently getting testosterone supplements  6. Erectile dysfunction, unspecified erectile dysfunction type  N52.9    Managed by urology on Cialis  7. Elevated LFTs  R79.89 COMPLETE METABOLIC PANEL WITH GFR   Recheck of labs today hope to see them improved with restarting medications and improving hyperlipidemia, diabetes and diet  8. Rheumatoid arthritis, 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HCC)  M05.9    no sx for years in hands and shoulders, but worse in the last 2-3 weeks alternating shoulder pain  9. Chronic pain of both shoulders  M25.511    G89.29    M25.512    See above -pain has been alternating over the past several weeks right to left shoulder and back  10. Screening for colon cancer  Z12.11 Ambulatory referral to Gastroenterology   Due for screening refer to GI   Health Maintenance  Topic Date Due  . COLONOSCOPY (Pts 45-37yrs Insurance coverage will need to be confirmed)  Never done  . OPHTHALMOLOGY EXAM  05/11/2019  . COVID-19 Vaccine (1) 05/15/2020 (Originally 09/08/1978)  . INFLUENZA VACCINE  06/26/2020 (Originally 10/28/2019)  . TETANUS/TDAP  04/29/2021 (Originally 03/29/2020)  . HIV Screening  04/29/2021 (Originally 09/07/1988)  . HEMOGLOBIN A1C  10/15/2020  . FOOT EXAM  04/29/2021  . PNEUMOCOCCAL POLYSACCHARIDE VACCINE AGE 39-64 HIGH RISK  Completed  . Hepatitis C Screening  Completed   Pt states he will arrange f/up with ophtho Foot exam done today  Reclines HIV and all vaccinations Discussed GI consult for colonoscopy for colon cancer screening  Patient is concerned that one of the medications he resumed may be causing his shoulder pain.  I encouraged him  to hold 1 medicine for a week at a time in order to be able to see if holding the medicine decreases the joint pain although I told him it is unlikely that his medicines are causing the joint pain it would be more likely to because muscle symptoms or myalgias.  Looked up all meds - lisinopril  -per up-to-date notes that it can cause muscle skeletal side effects including arthralgias but it does not cite the frequency or incidence of this Lipitor also notes possible joint pain or arthralgias can be a more common side effect around 10% noted in up-to-date Did not see joint pain as SE with jardiance  Will have pt try and hold lisinopril for a week by itself, also try lipitor for a week -patient does need to be on a statin so if he has intolerable side effects we will need to decrease the dose or try another statin or try every other day dosing to keep him on something   Return in about 6 months (around 10/27/2020) for Routine follow-up.   Danelle Berry, PA-C 04/29/20 10:25 AM

## 2020-04-30 ENCOUNTER — Encounter: Payer: Self-pay | Admitting: Family Medicine

## 2020-05-12 ENCOUNTER — Other Ambulatory Visit: Payer: Self-pay

## 2020-05-12 ENCOUNTER — Telehealth: Payer: Self-pay

## 2020-05-12 ENCOUNTER — Telehealth (INDEPENDENT_AMBULATORY_CARE_PROVIDER_SITE_OTHER): Payer: Self-pay | Admitting: Gastroenterology

## 2020-05-12 DIAGNOSIS — Z1211 Encounter for screening for malignant neoplasm of colon: Secondary | ICD-10-CM

## 2020-05-12 MED ORDER — PEG 3350-KCL-NA BICARB-NACL 420 G PO SOLR: 4000.0000 mL | Freq: Once | ORAL | 0 refills | Status: AC

## 2020-05-12 NOTE — Telephone Encounter (Signed)
Patient was contacted this morning for his 8:30 colonoscopy triage call.  LVM for him to call office back to reschedule triage call, since I was unable to contact him at scheduled appt time.  Thanks,  [place], [place]

## 2020-05-12 NOTE — Progress Notes (Signed)
Gastroenterology Pre-Procedure Review  Request Date: Friday 06/13/20 Requesting Physician: Dr. Maximino Greenland  PATIENT REVIEW QUESTIONS: The patient responded to the following health history questions as indicated:    1. Are you having any GI issues? no 2. Do you have a personal history of Polyps? no 3. Do you have a family history of Colon Cancer or Polyps? no 4. Diabetes Mellitus? yes (type 2) 5. Joint replacements in the past 12 months?no 6. Major health problems in the past 3 months?no 7. Any artificial heart valves, MVP, or defibrillator?no    MEDICATIONS & ALLERGIES:    Patient reports the following regarding taking any anticoagulation/antiplatelet therapy:   Plavix, Coumadin, Eliquis, Xarelto, Lovenox, Pradaxa, Brilinta, or Effient? no Aspirin? yes (81 mg daily)  Patient confirms/reports the following medications:  Current Outpatient Medications  Medication Sig Dispense Refill  . aspirin EC 81 MG tablet Take 81 mg by mouth daily.    Marland Kitchen atorvastatin (LIPITOR) 20 MG tablet Take 1 tablet (20 mg total) by mouth at bedtime. 90 tablet 1  . cholecalciferol (VITAMIN D) 1000 units tablet Take 1 tablet (1,000 Units total) by mouth daily.    . empagliflozin (JARDIANCE) 25 MG TABS tablet Take 25 mg by mouth daily.    . Insulin NPH, Human,, Isophane, (HUMULIN N) 100 UNIT/ML Kiwkpen Inject into the skin.    Marland Kitchen lisinopril (ZESTRIL) 10 MG tablet Take 1 tablet (10 mg total) by mouth daily. 90 tablet 1  . Multiple Vitamin (MULTIVITAMIN WITH MINERALS) TABS tablet Take 1 tablet by mouth daily.    . Continuous Blood Gluc Sensor (DEXCOM G6 SENSOR) MISC SMARTSIG:1 Each Topical Every 10 Days    . HYDROcodone-acetaminophen (NORCO/VICODIN) 5-325 MG tablet Take 1 tablet by mouth every 6 (six) hours as needed for moderate pain. (Patient not taking: No sig reported) 10 tablet 0  . tadalafil (CIALIS) 20 MG tablet Take 1 tablet (20 mg total) by mouth daily as needed for erectile dysfunction. (Patient not taking:  Reported on 05/12/2020) 30 tablet 3  . tadalafil (CIALIS) 5 MG tablet Take 1 tablet (5 mg total) by mouth daily as needed for erectile dysfunction. (Patient not taking: Reported on 05/12/2020) 30 tablet 3   No current facility-administered medications for this visit.    Patient confirms/reports the following allergies:  No Known Allergies  No orders of the defined types were placed in this encounter.   AUTHORIZATION INFORMATION Primary Insurance: 1D#: Group #:  Secondary Insurance: 1D#: Group #:  SCHEDULE INFORMATION: Date: Friday 06/13/20 Time: Location:[facility]

## 2020-06-11 ENCOUNTER — Other Ambulatory Visit
Admission: RE | Admit: 2020-06-11 | Discharge: 2020-06-11 | Disposition: A | Discharge status: Home / Self Care | Payer: BC Managed Care – PPO | Source: Ambulatory Visit | Attending: Gastroenterology | Admitting: Gastroenterology

## 2020-06-11 ENCOUNTER — Other Ambulatory Visit: Payer: Self-pay

## 2020-06-11 DIAGNOSIS — Z8614 Personal history of Methicillin resistant Staphylococcus aureus infection: Secondary | ICD-10-CM | POA: Diagnosis not present

## 2020-06-11 DIAGNOSIS — Z9049 Acquired absence of other specified parts of digestive tract: Secondary | ICD-10-CM | POA: Diagnosis not present

## 2020-06-11 DIAGNOSIS — Z01812 Encounter for preprocedural laboratory examination: Secondary | ICD-10-CM | POA: Insufficient documentation

## 2020-06-11 DIAGNOSIS — Z20822 Contact with and (suspected) exposure to covid-19: Secondary | ICD-10-CM | POA: Insufficient documentation

## 2020-06-11 DIAGNOSIS — Z7982 Long term (current) use of aspirin: Secondary | ICD-10-CM | POA: Diagnosis not present

## 2020-06-11 DIAGNOSIS — Z79899 Other long term (current) drug therapy: Secondary | ICD-10-CM | POA: Diagnosis not present

## 2020-06-11 DIAGNOSIS — Z833 Family history of diabetes mellitus: Secondary | ICD-10-CM | POA: Diagnosis not present

## 2020-06-11 DIAGNOSIS — Z8249 Family history of ischemic heart disease and other diseases of the circulatory system: Secondary | ICD-10-CM | POA: Diagnosis not present

## 2020-06-11 DIAGNOSIS — E1122 Type 2 diabetes mellitus with diabetic chronic kidney disease: Secondary | ICD-10-CM | POA: Diagnosis not present

## 2020-06-11 DIAGNOSIS — Z87891 Personal history of nicotine dependence: Secondary | ICD-10-CM | POA: Diagnosis not present

## 2020-06-11 DIAGNOSIS — E785 Hyperlipidemia, unspecified: Secondary | ICD-10-CM | POA: Diagnosis not present

## 2020-06-11 DIAGNOSIS — I129 Hypertensive chronic kidney disease with stage 1 through stage 4 chronic kidney disease, or unspecified chronic kidney disease: Secondary | ICD-10-CM | POA: Diagnosis not present

## 2020-06-11 DIAGNOSIS — Z794 Long term (current) use of insulin: Secondary | ICD-10-CM | POA: Diagnosis not present

## 2020-06-11 DIAGNOSIS — Z9852 Vasectomy status: Secondary | ICD-10-CM | POA: Diagnosis not present

## 2020-06-11 DIAGNOSIS — Z1211 Encounter for screening for malignant neoplasm of colon: Secondary | ICD-10-CM | POA: Diagnosis not present

## 2020-06-11 DIAGNOSIS — N189 Chronic kidney disease, unspecified: Secondary | ICD-10-CM | POA: Diagnosis not present

## 2020-06-11 LAB — SARS CORONAVIRUS 2 (TAT 6-24 HRS): SARS Coronavirus 2: NEGATIVE

## 2020-06-12 ENCOUNTER — Encounter: Payer: Self-pay | Admitting: Gastroenterology

## 2020-06-13 ENCOUNTER — Encounter: Payer: Self-pay | Admitting: Gastroenterology

## 2020-06-13 ENCOUNTER — Ambulatory Visit: Payer: BC Managed Care – PPO | Admitting: Certified Registered"

## 2020-06-13 ENCOUNTER — Encounter
Admission: RE | Disposition: A | Discharge status: Home / Self Care | Payer: Self-pay | Source: Ambulatory Visit | Attending: Gastroenterology

## 2020-06-13 ENCOUNTER — Telehealth: Payer: Self-pay

## 2020-06-13 ENCOUNTER — Ambulatory Visit
Admission: RE | Admit: 2020-06-13 | Discharge: 2020-06-13 | Disposition: A | Discharge status: Home / Self Care | Payer: BC Managed Care – PPO | Source: Ambulatory Visit | Attending: Gastroenterology | Admitting: Gastroenterology

## 2020-06-13 DIAGNOSIS — E785 Hyperlipidemia, unspecified: Secondary | ICD-10-CM | POA: Diagnosis not present

## 2020-06-13 DIAGNOSIS — Z20822 Contact with and (suspected) exposure to covid-19: Secondary | ICD-10-CM | POA: Diagnosis not present

## 2020-06-13 DIAGNOSIS — Z87891 Personal history of nicotine dependence: Secondary | ICD-10-CM | POA: Diagnosis not present

## 2020-06-13 DIAGNOSIS — Z79899 Other long term (current) drug therapy: Secondary | ICD-10-CM | POA: Diagnosis not present

## 2020-06-13 DIAGNOSIS — E1122 Type 2 diabetes mellitus with diabetic chronic kidney disease: Secondary | ICD-10-CM | POA: Insufficient documentation

## 2020-06-13 DIAGNOSIS — Z794 Long term (current) use of insulin: Secondary | ICD-10-CM | POA: Insufficient documentation

## 2020-06-13 DIAGNOSIS — N189 Chronic kidney disease, unspecified: Secondary | ICD-10-CM | POA: Insufficient documentation

## 2020-06-13 DIAGNOSIS — Z7982 Long term (current) use of aspirin: Secondary | ICD-10-CM | POA: Diagnosis not present

## 2020-06-13 DIAGNOSIS — Z8614 Personal history of Methicillin resistant Staphylococcus aureus infection: Secondary | ICD-10-CM | POA: Insufficient documentation

## 2020-06-13 DIAGNOSIS — Z1211 Encounter for screening for malignant neoplasm of colon: Secondary | ICD-10-CM | POA: Diagnosis not present

## 2020-06-13 DIAGNOSIS — Z8249 Family history of ischemic heart disease and other diseases of the circulatory system: Secondary | ICD-10-CM | POA: Diagnosis not present

## 2020-06-13 DIAGNOSIS — Z9049 Acquired absence of other specified parts of digestive tract: Secondary | ICD-10-CM | POA: Insufficient documentation

## 2020-06-13 DIAGNOSIS — Z9852 Vasectomy status: Secondary | ICD-10-CM | POA: Diagnosis not present

## 2020-06-13 DIAGNOSIS — Z833 Family history of diabetes mellitus: Secondary | ICD-10-CM | POA: Insufficient documentation

## 2020-06-13 DIAGNOSIS — I129 Hypertensive chronic kidney disease with stage 1 through stage 4 chronic kidney disease, or unspecified chronic kidney disease: Secondary | ICD-10-CM | POA: Insufficient documentation

## 2020-06-13 HISTORY — PX: COLONOSCOPY WITH PROPOFOL: SHX5780

## 2020-06-13 LAB — GLUCOSE, CAPILLARY: Glucose-Capillary: 113 mg/dL — ABNORMAL HIGH (ref 70–99)

## 2020-06-13 SURGERY — COLONOSCOPY WITH PROPOFOL
Anesthesia: General

## 2020-06-13 MED ORDER — GLYCOPYRROLATE 0.2 MG/ML IJ SOLN
INTRAMUSCULAR | Status: AC
  Filled 2020-06-13: qty 1

## 2020-06-13 MED ORDER — PROPOFOL 500 MG/50ML IV EMUL
INTRAVENOUS | Status: DC | PRN
  Administered 2020-06-13: 120 ug/kg/min via INTRAVENOUS

## 2020-06-13 MED ORDER — LIDOCAINE 2% (20 MG/ML) 5 ML SYRINGE
INTRAMUSCULAR | Status: DC | PRN
  Administered 2020-06-13: 25 mg via INTRAVENOUS

## 2020-06-13 MED ORDER — PROPOFOL 500 MG/50ML IV EMUL
INTRAVENOUS | Status: AC
  Filled 2020-06-13: qty 150

## 2020-06-13 MED ORDER — SODIUM CHLORIDE 0.9 % IV SOLN: INTRAVENOUS | Status: DC

## 2020-06-13 MED ORDER — LIDOCAINE HCL (PF) 2 % IJ SOLN
INTRAMUSCULAR | Status: AC
  Filled 2020-06-13: qty 5

## 2020-06-13 MED ORDER — MIDAZOLAM HCL 5 MG/5ML IJ SOLN
INTRAMUSCULAR | Status: DC | PRN
  Administered 2020-06-13: 2 mg via INTRAVENOUS

## 2020-06-13 MED ORDER — MIDAZOLAM HCL 2 MG/2ML IJ SOLN
INTRAMUSCULAR | Status: AC
  Filled 2020-06-13: qty 2

## 2020-06-13 MED ORDER — PROPOFOL 10 MG/ML IV BOLUS
INTRAVENOUS | Status: DC | PRN
  Administered 2020-06-13: 70 mg via INTRAVENOUS
  Administered 2020-06-13: 30 mg via INTRAVENOUS

## 2020-06-13 NOTE — Telephone Encounter (Signed)
-----   Message from Pasty Spillers, MD sent at 06/13/2020 10:11 AM EDT ----- Pt had poor prep today. Please call and schedule him for colonoscopy within 3 months. 2 day prep. Please advise him to start taking miralax 17g daily for 1 week prior to his colonoscopy date. He can pick up miralax samples at our office. 7 samples for 7 days prior to the procedure.

## 2020-06-13 NOTE — H&P (Signed)
Melodie Bouillon, MD [address], [place], [place], [address] [address], [address], [place], [place], [address] Phone: [phone]  Fax: [phone]  Primary Care Physician:  Danelle Berry, PA-C   Pre-Procedure History & Physical: HPI:  Nathan Rojas is a 45 y.o. male is here for a colonoscopy.   Past Medical History:  Diagnosis Date  . CKD (chronic kidney disease)   . Hyperlipidemia   . Hypertension   . MRSA infection around age 69   left hand wound  . Rheumatoid arthritis, 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HCC) 01/23/2016  . Trigeminal neuralgia 06/03/2015  . Type 2 diabetes mellitus, uncontrolled (HCC)     Past Surgical History:  Procedure Laterality Date  . APPENDECTOMY    . CHOLECYSTECTOMY N/A 05/06/2017   Procedure: LAPAROSCOPIC CHOLECYSTECTOMY;  Surgeon: Ancil Linsey, MD;  Location: [facility] ORS;  Service: General;  Laterality: N/A;  . TONSILLECTOMY    . TONSILLECTOMY    . VASECTOMY  Jan 2015    Prior to Admission medications   Medication Sig Start Date End Date Taking? Authorizing Provider  aspirin EC 81 MG tablet Take 81 mg by mouth daily.   Yes [provider]  atorvastatin (LIPITOR) 20 MG tablet Take 1 tablet (20 mg total) by mouth at bedtime. 04/21/20  Yes Danelle Berry, PA-C  cholecalciferol (VITAMIN D) 1000 units tablet Take 1 tablet (1,000 Units total) by mouth daily. 01/04/18  Yes Lada, Janit Bern, MD  empagliflozin (JARDIANCE) 25 MG TABS tablet Take 25 mg by mouth daily. 01/24/18  Yes [provider]  lisinopril (ZESTRIL) 10 MG tablet Take 1 tablet (10 mg total) by mouth daily. 04/21/20  Yes Danelle Berry, PA-C  Continuous Blood Gluc Sensor (DEXCOM G6 SENSOR) MISC SMARTSIG:1 Each Topical Every 10 Days 04/28/20   [provider]  HYDROcodone-acetaminophen (NORCO/VICODIN) 5-325 MG tablet Take 1 tablet by mouth every 6 (six) hours as needed for moderate pain. Patient not taking: No sig reported 03/30/20   Minna Antis, MD  Insulin NPH,  Human,, Isophane, (HUMULIN N) 100 UNIT/ML Kiwkpen Inject into the skin.    [provider]  Multiple Vitamin (MULTIVITAMIN WITH MINERALS) TABS tablet Take 1 tablet by mouth daily.    [provider]  tadalafil (CIALIS) 20 MG tablet Take 1 tablet (20 mg total) by mouth daily as needed for erectile dysfunction. Patient not taking: Reported on 05/12/2020 04/10/20   Michiel Cowboy A, PA-C  tadalafil (CIALIS) 5 MG tablet Take 1 tablet (5 mg total) by mouth daily as needed for erectile dysfunction. Patient not taking: Reported on 05/12/2020 04/10/20   Michiel Cowboy A, PA-C    Allergies as of 05/12/2020  . (No Known Allergies)    Family History  Problem Relation Age of Onset  . Diabetes Sister        type 1  . Diabetes Mother   . Dementia Maternal Grandmother   . AAA (abdominal aortic aneurysm) Maternal Grandfather   . Hypertension Maternal Grandfather     Social History   Socioeconomic History  . Marital status: Married    Spouse name: Tammy  . Number of children: 1  . Years of education: 86  . Highest education level: Associate degree: academic program  Occupational History  . Not on file  Tobacco Use  . Smoking status: Former Smoker    Packs/day: 1.00    Years: 25.00    Pack years: 25.00    Types: Cigarettes    Start date: 03/29/1989    Quit date:  02/08/2015    Years since quitting: 5.3  . Smokeless tobacco: Never Used  Vaping Use  . Vaping Use: Never used  Substance and Sexual Activity  . Alcohol use: Not Currently  . Drug use: No  . Sexual activity: Yes    Partners: Female  Other Topics Concern  . Not on file  Social History Narrative  . Not on file   Social Determinants of Health   Financial Resource Strain: Not on file  Food Insecurity: Not on file  Transportation Needs: Not on file  Physical Activity: Not on file  Stress: Not on file  Social Connections: Not on file  Intimate Partner Violence: Not on file    Review of Systems: See  HPI, otherwise negative ROS  Physical Exam: BP 126/86   Pulse 72   Temp (!) 96.6 F (35.9 C) (Temporal)   Resp 20   Ht 6\' 1"  (1.854 m)   Wt 113.4 kg   SpO2 99%   BMI 32.98 kg/m  General:   Alert,  pleasant and cooperative in NAD Head:  Normocephalic and atraumatic. Neck:  Supple; no masses or thyromegaly. Lungs:  Clear throughout to auscultation, normal respiratory effort.    Heart:  +S1, +S2, Regular rate and rhythm, No edema. Abdomen:  Soft, nontender and nondistended. Normal bowel sounds, without guarding, and without rebound.   Neurologic:  Alert and  oriented x4;  grossly normal neurologically.  Impression/Plan: is here for a colonoscopy to be performed for average risk screening.  Risks, benefits, limitations, and alternatives regarding  colonoscopy have been reviewed with the patient.  Questions have been answered.  All parties agreeable.   [name], MD  06/13/2020, 7:58 AM

## 2020-06-13 NOTE — Transfer of Care (Signed)
Immediate Anesthesia Transfer of Care Note  Patient: Nathan Rojas  Procedure(s) Performed: COLONOSCOPY WITH PROPOFOL (N/A )  Patient Location: Endoscopy Unit  Anesthesia Type:General  Level of Consciousness: drowsy  Airway & Oxygen Therapy: Patient Spontanous Breathing  Post-op Assessment: Report given to RN and Post -op Vital signs reviewed and stable  Post vital signs: Reviewed  Last Vitals:  Vitals Value Taken Time  BP    Temp    Pulse 75 06/13/20 0826  Resp 15 06/13/20 0826  SpO2 95 % 06/13/20 0826  Vitals shown include unvalidated device data.  Last Pain:  Vitals:   06/13/20 0719  TempSrc: Temporal  PainSc: 0-No pain         Complications: No complications documented.

## 2020-06-13 NOTE — Op Note (Signed)
[facility] Gastroenterology Patient Name: Nathan Rojas Procedure Date: 06/13/2020 7:05 AM MRN: [id number] Account #: [id number] Date of Birth: 09/17/1975 Admit Type: Outpatient Age: 45 Room: [facility] ENDO ROOM 2 Gender: Male Note Status: Finalized Procedure:             Colonoscopy Indications:           Screening for colorectal malignant neoplasm Providers:             Judieth Mckown B. Maximino Greenland MD, MD Referring MD:          Danelle Berry (Referring MD) Medicines:             Monitored Anesthesia Care Complications:         No immediate complications. Procedure:             Pre-Anesthesia Assessment:                        - Prior to the procedure, a History and Physical was                         performed, and patient medications, allergies and                         sensitivities were reviewed. The patient's tolerance                         of previous anesthesia was reviewed.                        - The risks and benefits of the procedure and the                         sedation options and risks were discussed with the                         patient. All questions were answered and informed                         consent was obtained.                        - Patient identification and proposed procedure were                         verified prior to the procedure by the physician, the                         nurse, the anesthetist and the technician. The                         procedure was verified in the pre-procedure area in                         the procedure room in the endoscopy suite.                        - ASA Grade Assessment: II - A patient with mild  systemic disease.                        - After reviewing the risks and benefits, the patient                         was deemed in satisfactory condition to undergo the                         procedure.                        After obtaining informed consent, the  colonoscope was                         passed under direct vision. Throughout the procedure,                         the patient's blood pressure, pulse, and oxygen                         saturations were monitored continuously. The                         Colonoscope was introduced through the anus and                         advanced to the the cecum, identified by appendiceal                         orifice and ileocecal valve. The colonoscopy was                         performed with ease. The patient tolerated the                         procedure well. The quality of the bowel preparation                         was fair except the ascending colon was poor and the                         cecum was poor. Findings:      The perianal and digital rectal examinations were normal.      The rectum, sigmoid colon, descending colon, transverse colon, ascending       colon and cecum appeared normal.      The retroflexed view of the distal rectum and anal verge was normal and       showed no anal or rectal abnormalities. Impression:            - The rectum, sigmoid colon, descending colon,                         transverse colon, ascending colon and cecum are normal.                        - The distal rectum and anal verge are normal on  retroflexion view.                        - No specimens collected.                        - Due to the prep, this was not a satisfactory exam                         for colorectal cancer screening, that is evaluation                         for small or flat lesions or polyps. Patient should                         follow up in clinic after discharge to discuss need                         for future colonoscopy and colorectal cancer screening. Recommendation:        - Discharge patient to home.                        - Resume previous diet.                        - Continue present medications.                        - Repeat  colonoscopy within 3 months, with 2 day prep                         because the bowel preparation was suboptimal.                        - Return to primary care physician as previously                         scheduled.                        - The findings and recommendations were discussed with                         the patient.                        - The findings and recommendations were discussed with                         the patient's family. Procedure Code(s):     --- Professional ---                        [phone], Colonoscopy, flexible; diagnostic, including                         collection of specimen(s) by brushing or washing, when                         performed (separate procedure) Diagnosis Code(s):     --- Professional ---  Z12.11, Encounter for screening for malignant neoplasm                         of colon CPT copyright 2019 American Medical [employer]. All rights reserved. The codes documented in this report are preliminary and upon coder review may  be revised to meet current compliance requirements.  Melodie Bouillon, MD Michel Bickers B. Maximino Greenland MD, MD 06/13/2020 8:28:33 AM This report has been signed electronically. Number of Addenda: 0 Note Initiated On: 06/13/2020 7:05 AM Scope Withdrawal Time: 0 hours 11 minutes 23 seconds  Total Procedure Duration: 0 hours 16 minutes 31 seconds       [facility]

## 2020-06-13 NOTE — Anesthesia Preprocedure Evaluation (Signed)
Anesthesia Evaluation  Patient identified by MRN, date of birth, ID band Patient awake    Reviewed: Allergy & Precautions, H&P , NPO status , Patient's Chart, lab work & pertinent test results  History of Anesthesia Complications Negative for: history of anesthetic complications  Airway Mallampati: II  TM Distance: >3 FB Neck ROM: full    Dental  (+) Missing   Pulmonary neg sleep apnea, neg COPD, former smoker,    breath sounds clear to auscultation       Cardiovascular hypertension, (-) angina(-) Past MI and (-) Cardiac Stents (-) dysrhythmias  Rhythm:regular Rate:Normal     Neuro/Psych negative neurological ROS  negative psych ROS   GI/Hepatic negative GI ROS, Neg liver ROS,   Endo/Other  diabetes  Renal/GU Renal disease (CKD)  negative genitourinary   Musculoskeletal   Abdominal   Peds  Hematology negative hematology ROS (+)   Anesthesia Other Findings Past Medical History: No date: CKD (chronic kidney disease) No date: Hyperlipidemia No date: Hypertension around age 1: MRSA infection     Comment:  left hand wound 01/23/2016: Rheumatoid arthritis, 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HCC) 06/03/2015: Trigeminal neuralgia No date: Type 2 diabetes mellitus, uncontrolled (HCC)  Past Surgical History: No date: APPENDECTOMY 05/06/2017: CHOLECYSTECTOMY; N/A     Comment:  Procedure: LAPAROSCOPIC CHOLECYSTECTOMY;  Surgeon:               Ancil Linsey, MD;  Location: [facility] ORS;  Service:               General;  Laterality: N/A; No date: TONSILLECTOMY No date: TONSILLECTOMY Jan 2015: VASECTOMY  BMI    Body Mass Index: 32.98 kg/m      Reproductive/Obstetrics negative OB ROS                             Anesthesia Physical Anesthesia Plan  ASA: II  Anesthesia Plan: General   Post-op Pain Management:    Induction:   PONV Risk Score and Plan: Propofol infusion and TIVA  Airway Management Planned:  Nasal Cannula  Additional Equipment:   Intra-op Plan:   Post-operative Plan:   Informed Consent: I have reviewed the patients History and Physical, chart, labs and discussed the procedure including the risks, benefits and alternatives for the proposed anesthesia with the patient or authorized representative who has indicated his/her understanding and acceptance.     Dental Advisory Given  Plan Discussed with: Anesthesiologist, CRNA and Surgeon  Anesthesia Plan Comments:         Anesthesia Quick Evaluation

## 2020-06-15 NOTE — Anesthesia Postprocedure Evaluation (Signed)
Anesthesia Post Note  Patient: Nathan Rojas  Procedure(s) Performed: COLONOSCOPY WITH PROPOFOL (N/A )  Patient location during evaluation: PACU Anesthesia Type: General Level of consciousness: awake and alert Pain management: pain level controlled Vital Signs Assessment: post-procedure vital signs reviewed and stable Respiratory status: spontaneous breathing, nonlabored ventilation and respiratory function stable Cardiovascular status: blood pressure returned to baseline and stable Postop Assessment: no apparent nausea or vomiting Anesthetic complications: no   No complications documented.   Last Vitals:  Vitals:   06/13/20 0826 06/13/20 0856  BP: 116/81 (!) 134/96  Pulse:    Resp:    Temp: (!) 35.9 C   SpO2:      Last Pain:  Vitals:   06/13/20 0856  TempSrc:   PainSc: 0-No pain                 Karleen Hampshire

## 2020-07-10 MED ORDER — PEG 3350-KCL-NA BICARB-NACL 420 G PO SOLR: ORAL | 0 refills | Status: DC

## 2020-07-10 NOTE — Telephone Encounter (Signed)
Called patient again and he was able to schedule his colonoscopy for September 19, 2020 at [facility]. Patient was given instructions. I will also send his instructions via MyChart. Patient had no further questions.

## 2020-07-24 DIAGNOSIS — E1169 Type 2 diabetes mellitus with other specified complication: Secondary | ICD-10-CM | POA: Diagnosis not present

## 2020-07-24 DIAGNOSIS — Z794 Long term (current) use of insulin: Secondary | ICD-10-CM | POA: Diagnosis not present

## 2020-07-24 DIAGNOSIS — E669 Obesity, unspecified: Secondary | ICD-10-CM | POA: Diagnosis not present

## 2020-07-24 DIAGNOSIS — E1159 Type 2 diabetes mellitus with other circulatory complications: Secondary | ICD-10-CM | POA: Diagnosis not present

## 2020-07-24 DIAGNOSIS — E1165 Type 2 diabetes mellitus with hyperglycemia: Secondary | ICD-10-CM | POA: Diagnosis not present

## 2020-07-24 LAB — HEMOGLOBIN A1C: Hemoglobin A1C: 7

## 2020-10-03 ENCOUNTER — Other Ambulatory Visit: Payer: Self-pay | Admitting: Family Medicine

## 2020-10-03 DIAGNOSIS — E1165 Type 2 diabetes mellitus with hyperglycemia: Secondary | ICD-10-CM

## 2020-10-03 DIAGNOSIS — E782 Mixed hyperlipidemia: Secondary | ICD-10-CM

## 2020-10-03 DIAGNOSIS — IMO0002 Reserved for concepts with insufficient information to code with codable children: Secondary | ICD-10-CM

## 2020-10-20 ENCOUNTER — Other Ambulatory Visit: Payer: Self-pay | Admitting: Family Medicine

## 2020-10-20 DIAGNOSIS — I10 Essential (primary) hypertension: Secondary | ICD-10-CM

## 2020-10-20 DIAGNOSIS — IMO0002 Reserved for concepts with insufficient information to code with codable children: Secondary | ICD-10-CM

## 2020-10-20 DIAGNOSIS — E1165 Type 2 diabetes mellitus with hyperglycemia: Secondary | ICD-10-CM

## 2020-10-31 ENCOUNTER — Ambulatory Visit
Admission: RE | Admit: 2020-10-31 | Discharge: 2020-10-31 | Disposition: A | Discharge status: Home / Self Care | Payer: BC Managed Care – PPO | Source: Ambulatory Visit | Attending: Family Medicine | Admitting: Family Medicine

## 2020-10-31 ENCOUNTER — Other Ambulatory Visit: Payer: Self-pay

## 2020-10-31 ENCOUNTER — Ambulatory Visit: Payer: BC Managed Care – PPO | Admitting: Family Medicine

## 2020-10-31 ENCOUNTER — Encounter: Payer: Self-pay | Admitting: Family Medicine

## 2020-10-31 ENCOUNTER — Ambulatory Visit
Admission: RE | Admit: 2020-10-31 | Discharge: 2020-10-31 | Disposition: A | Discharge status: Home / Self Care | Payer: BC Managed Care – PPO | Attending: Family Medicine | Admitting: Family Medicine

## 2020-10-31 VITALS — BP 130/82 | HR 85 | Temp 97.7°F | Resp 16 | Ht 73.0 in | Wt 268.8 lb

## 2020-10-31 DIAGNOSIS — R079 Chest pain, unspecified: Secondary | ICD-10-CM

## 2020-10-31 DIAGNOSIS — E1165 Type 2 diabetes mellitus with hyperglycemia: Secondary | ICD-10-CM

## 2020-10-31 DIAGNOSIS — E1169 Type 2 diabetes mellitus with other specified complication: Secondary | ICD-10-CM | POA: Diagnosis not present

## 2020-10-31 DIAGNOSIS — R0600 Dyspnea, unspecified: Secondary | ICD-10-CM

## 2020-10-31 DIAGNOSIS — N529 Male erectile dysfunction, unspecified: Secondary | ICD-10-CM

## 2020-10-31 DIAGNOSIS — Z6835 Body mass index (BMI) 35.0-35.9, adult: Secondary | ICD-10-CM

## 2020-10-31 DIAGNOSIS — E785 Hyperlipidemia, unspecified: Secondary | ICD-10-CM

## 2020-10-31 DIAGNOSIS — R0609 Other forms of dyspnea: Secondary | ICD-10-CM

## 2020-10-31 DIAGNOSIS — Z5181 Encounter for therapeutic drug level monitoring: Secondary | ICD-10-CM

## 2020-10-31 DIAGNOSIS — Z794 Long term (current) use of insulin: Secondary | ICD-10-CM

## 2020-10-31 DIAGNOSIS — I10 Essential (primary) hypertension: Secondary | ICD-10-CM | POA: Diagnosis not present

## 2020-10-31 DIAGNOSIS — I152 Hypertension secondary to endocrine disorders: Secondary | ICD-10-CM | POA: Diagnosis not present

## 2020-10-31 DIAGNOSIS — M059 Rheumatoid arthritis with rheumatoid factor, unspecified: Secondary | ICD-10-CM

## 2020-10-31 DIAGNOSIS — E1159 Type 2 diabetes mellitus with other circulatory complications: Secondary | ICD-10-CM

## 2020-10-31 DIAGNOSIS — R0602 Shortness of breath: Secondary | ICD-10-CM | POA: Diagnosis not present

## 2020-10-31 DIAGNOSIS — E782 Mixed hyperlipidemia: Secondary | ICD-10-CM | POA: Diagnosis not present

## 2020-10-31 DIAGNOSIS — I700 Atherosclerosis of aorta: Secondary | ICD-10-CM

## 2020-10-31 NOTE — Progress Notes (Signed)
Name: Nathan Rojas   MRN: [id number]    DOB: 08-Apr-1975   Date:10/31/2020       Progress Note  Chief Complaint  Patient presents with   Hyperlipidemia   Hypertension     Subjective:   Nathan Rojas is a 45 y.o. male, presents to clinic for routine f/up  DM:   Managing with specialists - 30 units prior to meals and 30 units long acting insulin no highs or lows, fasting glucose is usually 120's Sees [facility], he did miss an appt and rescheduled for next month - on dexcom - waiting for new sensors  Recent pertinent labs: Lab Results  Component Value Date   HGBA1C 7.0 07/24/2020   HGBA1C 8.2 04/17/2020   HGBA1C 9.4 (H) 04/07/2018   Standard of care and health maintenance: Foot exam:  done DM eye exam:  due - ordered ACEI/ARB:  lisinopril Statin:  lipitor   Seeing urology for ED, low T, had ER visit for priapism, is now only being treated with cialis, no injections, and low T did not meet insurance requirement for them to pay for testosterone replacement meds   Hypertension: Currently managed on lisinopril 10 mg daily  Pt reports good med compliance and denies any SE.   Blood pressure today is well controlled. BP Readings from Last 3 Encounters:  10/31/20 130/82  06/13/20 (!) 134/96  04/29/20 134/86   Pt denies CP, SOB, exertional sx, LE edema, palpitation, Ha's, visual disturbances, lightheadedness, hypotension, syncope. He has eaten worse over the past couple months and gained some weight - due to multiple vacations   Hyperlipidemia: Currently treated with lipitor 20 mg, pt reports good med compliance, labs done last OV and lipids returned to goal, continues to take, no SE or concerns, again worse diet/lifestyle lately Last Lipids: Lab Results  Component Value Date   CHOL 132 04/29/2020   HDL 30 (L) 04/29/2020   LDLCALC 79 04/29/2020   TRIG 129 04/29/2020   CHOLHDL 4.4 04/29/2020  - Denies: Chest pain, shortness of breath, myalgias, claudication    No pain  no RA sx Since restarting all his meds he's had alternating shoulder pain -he has a history of RA which in the past has affected d his joints he has not had any injury or strenuous activity to either shoulder, he started all of his medications again at the same time and also has made some recent diet changes He denies any myalgias body aches severe fatigue He did have elevated LFTs with his last labs and is due to recheck that His RA symptoms have been well controlled and he has not been on any immunosuppressive medications for several years    At end of visit pt mentions episodes of CP CP to left lateral side of chest and upper left chest which comes and goes, dull, worse with exertion with associated DOE, feels like he can't breath.  The SOB seems to subside when he rests but CP comes and goes and does not seem to resolve with rest No associated diaphoresis, near syncope, palpitations, orthopnea, some mild LE edema dependent improves overnight, no cough, wheeze Prior ECG 2019 reviewed Pt wants a stress test He denies any hx of lung disease - no asthma, COPD, bronchitis    Current Outpatient Medications:    aspirin EC 81 MG tablet, Take 81 mg by mouth daily., Disp: , Rfl:    atorvastatin (LIPITOR) 20 MG tablet, TAKE 1 TABLET AT BEDTIME, Disp: 90 tablet, Rfl: 3  cholecalciferol (VITAMIN D) 1000 units tablet, Take 1 tablet (1,000 Units total) by mouth daily., Disp: , Rfl:    Continuous Blood Gluc Sensor (DEXCOM G6 SENSOR) MISC, SMARTSIG:1 Each Topical Every 10 Days, Disp: , Rfl:    empagliflozin (JARDIANCE) 25 MG TABS tablet, Take 25 mg by mouth daily., Disp: , Rfl:    Insulin NPH, Human,, Isophane, (HUMULIN N) 100 UNIT/ML Kiwkpen, Inject into the skin., Disp: , Rfl:    lisinopril (ZESTRIL) 10 MG tablet, TAKE 1 TABLET DAILY, Disp: 90 tablet, Rfl: 3   Multiple Vitamin (MULTIVITAMIN WITH MINERALS) TABS tablet, Take 1 tablet by mouth daily., Disp: , Rfl:    tadalafil (CIALIS) 20 MG tablet, Take  1 tablet (20 mg total) by mouth daily as needed for erectile dysfunction. (Patient not taking: Reported on 05/12/2020), Disp: 30 tablet, Rfl: 3   tadalafil (CIALIS) 5 MG tablet, Take 1 tablet (5 mg total) by mouth daily as needed for erectile dysfunction. (Patient not taking: Reported on 05/12/2020), Disp: 30 tablet, Rfl: 3  Patient Active Problem List   Diagnosis Date Noted   Hypertension associated with type 2 diabetes mellitus (HCC) 05/01/2018   Hyperlipidemia associated with type 2 diabetes mellitus (HCC) 05/01/2018   Type 2 diabetes mellitus with hyperglycemia, with long-term current use of insulin (HCC) 05/01/2018   Preventative health care 01/10/2018   Erectile dysfunction 04/27/2017   Encounter for long-term (current) use of high-risk medication 03/05/2016   Rheumatoid arthritis, 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HCC) 01/23/2016   Obesity 04/18/2015   Abdominal aortic atherosclerosis (HCC) 11/16/2014   Hyperlipidemia     Past Surgical History:  Procedure Laterality Date   APPENDECTOMY     CHOLECYSTECTOMY N/A 05/06/2017   Procedure: LAPAROSCOPIC CHOLECYSTECTOMY;  Surgeon: Ancil Linsey, MD;  Location: [facility] ORS;  Service: General;  Laterality: N/A;   COLONOSCOPY WITH PROPOFOL N/A 06/13/2020   Procedure: COLONOSCOPY WITH PROPOFOL;  Surgeon: Pasty Spillers, MD;  Location: [facility] ENDOSCOPY;  Service: Endoscopy;  Laterality: N/A;   TONSILLECTOMY     TONSILLECTOMY     VASECTOMY  Jan 2015    Family History  Problem Relation Age of Onset   Diabetes Sister        type 1   Diabetes Mother    Dementia Maternal Grandmother    AAA (abdominal aortic aneurysm) Maternal Grandfather    Hypertension Maternal Grandfather     Social History   Tobacco Use   Smoking status: Former    Packs/day: 1.00    Years: 25.00    Pack years: 25.00    Types: Cigarettes    Start date: 03/29/1989    Quit date: 02/08/2015    Years since quitting: 5.7   Smokeless tobacco: Never  Vaping Use   Vaping Use: Never used   Substance Use Topics   Alcohol use: Not Currently   Drug use: No     No Known Allergies  Health Maintenance  Topic Date Due   OPHTHALMOLOGY EXAM  05/11/2019   COVID-19 Vaccine (1) 11/16/2020 (Originally 09/08/1978)   INFLUENZA VACCINE  12/10/2020 (Originally 10/27/2020)   TETANUS/TDAP  04/29/2021 (Originally 03/29/2020)   HIV Screening  04/29/2021 (Originally 09/07/1988)   Pneumococcal Vaccine 27-56 Years old (2 - PCV) 10/31/2021 (Originally 09/15/2014)   HEMOGLOBIN A1C  01/23/2021   FOOT EXAM  04/29/2021   COLONOSCOPY (Pts 45-3yrs Insurance coverage will need to be confirmed)  06/14/2030   PNEUMOCOCCAL POLYSACCHARIDE VACCINE AGE 54-64 HIGH RISK  Completed   Hepatitis C Screening  Completed   HPV VACCINES  Aged Out    Chart Review Today: I personally reviewed active problem list, medication list, allergies, family history, social history, health maintenance, notes from last encounter, lab results, imaging with the patient/caregiver today.   Review of Systems  Constitutional: Negative.  Negative for activity change, appetite change, chills, diaphoresis and fatigue.  HENT: Negative.    Eyes: Negative.   Respiratory:  Positive for shortness of breath. Negative for cough, choking, chest tightness and wheezing.   Cardiovascular:  Positive for chest pain. Negative for palpitations and leg swelling.  Gastrointestinal: Negative.  Negative for abdominal pain, diarrhea, nausea and vomiting.  Endocrine: Negative.   Genitourinary: Negative.   Musculoskeletal: Negative.   Skin: Negative.  Negative for color change.  Allergic/Immunologic: Negative.   Neurological: Negative.  Negative for dizziness, tremors, syncope, weakness, light-headedness, numbness and headaches.  Hematological: Negative.   Psychiatric/Behavioral: Negative.    All other systems reviewed and are negative.   Objective:   Vitals:   10/31/20 0853  BP: 130/82  Pulse: 85  Resp: 16  Temp: 97.7 F (36.5 C)  SpO2: 94%   Weight: 268 lb 12.8 oz (121.9 kg)  Height: 6\' 1"  (1.854 m)    Body mass index is 35.46 kg/m.  Physical Exam Vitals and nursing note reviewed.  Constitutional:      General: He is not in acute distress.    Appearance: Normal appearance. He is well-developed. He is obese. He is not ill-appearing, toxic-appearing or diaphoretic.     Interventions: Face mask in place.  HENT:     Head: Normocephalic and atraumatic.     Jaw: No trismus.     Right Ear: External ear normal.     Left Ear: External ear normal.  Eyes:     General: Lids are normal. No scleral icterus.       Right eye: No discharge.        Left eye: No discharge.     Conjunctiva/sclera: Conjunctivae normal.  Neck:     Trachea: Trachea and phonation normal. No tracheal deviation.  Cardiovascular:     Rate and Rhythm: Normal rate and regular rhythm.     Pulses: Normal pulses.          Radial pulses are 2+ on the right side and 2+ on the left side.       Posterior tibial pulses are 2+ on the right side and 2+ on the left side.     Heart sounds: Normal heart sounds. No murmur heard.   No friction rub. No gallop.  Pulmonary:     Effort: Pulmonary effort is normal. No respiratory distress.     Breath sounds: Normal breath sounds. No stridor. No wheezing, rhonchi or rales.  Abdominal:     General: Bowel sounds are normal. There is no distension.     Palpations: Abdomen is soft.     Tenderness: There is no abdominal tenderness. There is no guarding or rebound.  Musculoskeletal:     Right lower leg: No edema.     Left lower leg: No edema.  Skin:    General: Skin is warm and dry.     Capillary Refill: Capillary refill takes less than 2 seconds.     Coloration: Skin is not jaundiced.     Findings: No rash.     Nails: There is no clubbing.  Neurological:     Mental Status: He is alert. Mental status is at baseline.     Cranial Nerves: No dysarthria or facial asymmetry.  Motor: No tremor or abnormal muscle tone.      Gait: Gait normal.  Psychiatric:        Mood and Affect: Mood normal.        Speech: Speech normal.        Behavior: Behavior normal. Behavior is cooperative.    ECG interpretation   Date: 10/31/20  Rate: 61   Rhythm: normal sinus rhythm  QRS Axis: normal  Intervals: normal  ST/T Wave abnormalities: normal  Conduction Disutrbances: none  Narrative Interpretation: sinus rhythm  Old EKG Reviewed: reviewed 04/29/2017 ecg No significant changes noted    Assessment & Plan:   1. Hypertension associated with type 2 diabetes mellitus (HCC) Stable, well controlled BP at goal today   Chemistry      Component Value Date/Time   NA 135 04/29/2020 1056   NA 138 09/05/2015 1026   K 4.5 04/29/2020 1056   CL 98 04/29/2020 1056   CO2 28 04/29/2020 1056   BUN 20 04/29/2020 1056   BUN 21 09/05/2015 1026   CREATININE 0.94 04/29/2020 1056      Component Value Date/Time   CALCIUM 9.9 04/29/2020 1056   ALKPHOS 59 04/29/2017 0807   AST 27 04/29/2020 1056   ALT 44 04/29/2020 1056   BILITOT 1.8 (H) 04/29/2020 1056   BILITOT 1.5 (H) 09/05/2015 1026     Continue lisinopril 10 mg daily and work on improved diet/lifestyle/DASH efforts - COMPLETE METABOLIC PANEL WITH GFR  2. Hyperlipidemia associated with type 2 diabetes mellitus (HCC) Last lipids reviewed from Feb 2022 and improved and at goal, no SE or concerns with getting back onto statin daily, continue meds - will monitor lipid panel annually - COMPLETE METABOLIC PANEL WITH GFR  3. Type 2 diabetes mellitus with hyperglycemia, with long-term current use of insulin (HCC) Per specialists, however he has missed an appt 30 units basal insulin and 30 units mealtime insulin, on dexcom, fasting 120's highs 180's has gained some weight and had worse diet   Lab Results  Component Value Date   HGBA1C 7.0 07/24/2020   HGBA1C 8.2 04/17/2020   HGBA1C 9.4 (H) 04/07/2018   Was recently uncontrolled but quickly improved with reestablishing with  routine f/up care with primary and endo, needs DM eye exam - Ambulatory referral to Ophthalmology - Hemoglobin A1c - COMPLETE METABOLIC PANEL WITH GFR - Hemoglobin A1c  4. Chest pain, unspecified type Notes intermittent CP left side and sometimes to left upper chest with associated SOB - SOB occurs with pain with exertion but CP comes and goes as well randomly, no radiation of pain, no associated diaphoresis, palpitations, near syncope Pt wants stress test - will refer to cardiology for further work up ECG done today NSR, lungs clear, only very mild LE edema and marks from socks from last night, but non pitting edema, orthopnea, pnd palpitations to suggest CHF, pt appears euvolemic today, no pain right now, no need for ER visit - red flags reviewed and printed for pt in AVS and he verbalized ER and 911 precautions - EKG 12-Lead - DG Chest 2 View; Future  Today VSS, ECG reassuring, no concern for ACS or PE Cardiology referral put in   5. DOE (dyspnea on exertion) Lungs clear, no hx of lung disease, CXR to further eval - he wants cardiac w/o, if no cardiac findings may also need to do PFT's - EKG 12-Lead - DG Chest 2 View; Future  6. Abdominal aortic atherosclerosis (HCC) On statin, monitoring  7.  Class 2 severe obesity with serious comorbidity and body mass index (BMI) of 35.0 to 35.9 in adult, unspecified obesity type (HCC) Weight up, encouraged to again work on healthier diet/lifestyle choices - COMPLETE METABOLIC PANEL WITH GFR - Hemoglobin A1c  8. Rheumatoid arthritis, 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HCC) No sx currently, not on meds, monitoring - COMPLETE METABOLIC PANEL WITH GFR  9. Erectile dysfunction, unspecified erectile dysfunction type Per urology, on meds  10. Encounter for medication monitoring  - Hemoglobin A1c - COMPLETE METABOLIC PANEL WITH GFR - Hemoglobin A1c    Return in about 6 months (around 05/03/2021) for Annual Physical, Routine follow-up.   Danelle Berry,  PA-C 10/31/20 11:37 AM

## 2020-10-31 NOTE — Patient Instructions (Signed)
Nonspecific Chest Pain Chest pain can be caused by many different conditions. Some causes of chest pain can be life-threatening. These will require treatment right away. Serious causes of chest pain include: Heart attack. A tear in the body's main blood vessel. Redness and swelling (inflammation) around your heart. Blood clot in your lungs. Other causes of chest pain may not be so serious. These include: Heartburn. Anxiety or stress.  Damage to bones or muscles in your chest. Lung infections. Chest pain can feel like: Pain or discomfort in your chest. Crushing, pressure, aching, or squeezing pain. Burning or tingling. Dull or sharp pain that is worse when you move, cough, or take a deep breath. Pain or discomfort that is also felt in your back, neck, jaw, shoulder, or arm, or pain that spreads to any of these areas. It is hard to know whether your pain is caused by something that is serious or something that is not so serious. So it is important to see your doctor rightaway if you have chest pain. Follow these instructions at home: Medicines Take over-the-counter and prescription medicines only as told by your doctor. If you were prescribed an antibiotic medicine, take it as told by your doctor. Do not stop taking the antibiotic even if you start to feel better. Lifestyle  Rest as told by your doctor. Do not use any products that contain nicotine or tobacco, such as cigarettes, e-cigarettes, and chewing tobacco. If you need help quitting, ask your doctor. Do not drink alcohol. Make lifestyle changes as told by your doctor. These may include: Getting regular exercise. Ask your doctor what activities are safe for you. Eating a heart-healthy diet. A diet and nutrition specialist (dietitian) can help you to learn healthy eating options. Staying at a healthy weight. Treating diabetes or high blood pressure, if needed. Lowering your stress. Activities such as yoga and relaxation techniques  can help.  General instructions Pay attention to any changes in your symptoms. Tell your doctor about them or any new symptoms. Avoid any activities that cause chest pain. Keep all follow-up visits as told by your doctor. This is important. You may need more testing if your chest pain does not go away. Contact a doctor if: Your chest pain does not go away. You feel depressed. You have a fever. Get help right away if: Your chest pain is worse. You have a cough that gets worse, or you cough up blood. You have very bad (severe) pain in your belly (abdomen). You pass out (faint). You have either of these for no clear reason: Sudden chest discomfort. Sudden discomfort in your arms, back, neck, or jaw. You have shortness of breath at any time. You suddenly start to sweat, or your skin gets clammy. You feel sick to your stomach (nauseous). You throw up (vomit). You suddenly feel lightheaded or dizzy. You feel very weak or tired. Your heart starts to beat fast, or it feels like it is skipping beats. These symptoms may be an emergency. Do not wait to see if the symptoms will go away. Get medical help right away. Call your local emergency services (911 in the [place] Do not drive yourself to the hospital. Summary Chest pain can be caused by many different conditions. The cause may be serious and need treatment right away. If you have chest pain, see your doctor right away. Follow your doctor's instructions for taking medicines and making lifestyle changes. Keep all follow-up visits as told by your doctor. This includes visits for any   further testing if your chest pain does not go away. Be sure to know the signs that show that your condition has become worse. Get help right away if you have these symptoms. This information is not intended to replace advice given to you by your health care provider. Make sure you discuss any questions you have with your healthcare provider. Document Revised:  09/15/2017 Document Reviewed: 09/15/2017 Elsevier Patient Education  2022 [employer]  

## 2020-11-01 LAB — COMPLETE METABOLIC PANEL WITH GFR
AG Ratio: 1.8 (calc) (ref 1.0–2.5)
ALT: 31 U/L (ref 9–46)
AST: 23 U/L (ref 10–40)
Albumin: 4.6 g/dL (ref 3.6–5.1)
Alkaline phosphatase (APISO): 51 U/L (ref 36–130)
BUN: 15 mg/dL (ref 7–25)
CO2: 25 mmol/L (ref 20–32)
Calcium: 9.2 mg/dL (ref 8.6–10.3)
Chloride: 102 mmol/L (ref 98–110)
Creat: 0.85 mg/dL (ref 0.60–1.29)
Globulin: 2.6 g/dL (calc) (ref 1.9–3.7)
Glucose, Bld: 177 mg/dL — ABNORMAL HIGH (ref 65–99)
Potassium: 4.2 mmol/L (ref 3.5–5.3)
Sodium: 136 mmol/L (ref 135–146)
Total Bilirubin: 0.9 mg/dL (ref 0.2–1.2)
Total Protein: 7.2 g/dL (ref 6.1–8.1)
eGFR: 108 mL/min/{1.73_m2} (ref >=60)

## 2020-11-01 LAB — HEMOGLOBIN A1C
Hgb A1c MFr Bld: 9.5 % of total Hgb — ABNORMAL HIGH (ref <5.7)
Mean Plasma Glucose: 226 mg/dL
eAG (mmol/L): 12.5 mmol/L

## 2020-12-27 DIAGNOSIS — K760 Fatty (change of) liver, not elsewhere classified: Secondary | ICD-10-CM

## 2020-12-27 HISTORY — DX: Fatty (change of) liver, not elsewhere classified: K76.0

## 2021-01-01 ENCOUNTER — Ambulatory Visit (INDEPENDENT_AMBULATORY_CARE_PROVIDER_SITE_OTHER): Payer: BC Managed Care – PPO | Admitting: Cardiology

## 2021-01-01 ENCOUNTER — Encounter (HOSPITAL_COMMUNITY): Payer: Self-pay

## 2021-01-01 ENCOUNTER — Other Ambulatory Visit: Payer: Self-pay

## 2021-01-01 ENCOUNTER — Encounter: Payer: Self-pay | Admitting: Cardiology

## 2021-01-01 VITALS — BP 120/80 | HR 63 | Ht 73.5 in | Wt 260.0 lb

## 2021-01-01 DIAGNOSIS — R0609 Other forms of dyspnea: Secondary | ICD-10-CM | POA: Insufficient documentation

## 2021-01-01 DIAGNOSIS — I700 Atherosclerosis of aorta: Secondary | ICD-10-CM | POA: Diagnosis not present

## 2021-01-01 DIAGNOSIS — Z794 Long term (current) use of insulin: Secondary | ICD-10-CM

## 2021-01-01 DIAGNOSIS — E6609 Other obesity due to excess calories: Secondary | ICD-10-CM

## 2021-01-01 DIAGNOSIS — E785 Hyperlipidemia, unspecified: Secondary | ICD-10-CM

## 2021-01-01 DIAGNOSIS — E8881 Metabolic syndrome: Secondary | ICD-10-CM

## 2021-01-01 DIAGNOSIS — I152 Hypertension secondary to endocrine disorders: Secondary | ICD-10-CM | POA: Diagnosis not present

## 2021-01-01 DIAGNOSIS — E1159 Type 2 diabetes mellitus with other circulatory complications: Secondary | ICD-10-CM | POA: Diagnosis not present

## 2021-01-01 DIAGNOSIS — R072 Precordial pain: Secondary | ICD-10-CM

## 2021-01-01 DIAGNOSIS — E1169 Type 2 diabetes mellitus with other specified complication: Secondary | ICD-10-CM

## 2021-01-01 DIAGNOSIS — Z6833 Body mass index (BMI) 33.0-33.9, adult: Secondary | ICD-10-CM

## 2021-01-01 DIAGNOSIS — E1165 Type 2 diabetes mellitus with hyperglycemia: Secondary | ICD-10-CM

## 2021-01-01 MED ORDER — METOPROLOL TARTRATE 50 MG PO TABS: ORAL_TABLET | ORAL | 0 refills | Status: DC

## 2021-01-01 NOTE — Patient Instructions (Addendum)
Medication Instructions:  Your physician recommends that you continue on your current medications as directed. Please refer to the Current Medication list given to you today.  *If you need a refill on your cardiac medications before your next appointment, please call your pharmacy*   Lab Work: Lipid and CMP today If you have labs (blood work) drawn today and your tests are completely normal, you will receive your results only by: MyChart Message (if you have MyChart) OR A paper copy in the mail If you have any lab test that is abnormal or we need to change your treatment, we will call you to review the results.   Testing/Procedures: Your physician has requested that you have cardiac CT. Cardiac computed tomography (CT) is a painless test that uses an x-ray machine to take clear, detailed pictures of your heart. For further information please visit [email or web address] Please follow instruction sheet as given.     Follow-Up: At [facility], you and your health needs are our priority.  As part of our continuing mission to provide you with exceptional heart care, we have created designated Provider Care Teams.  These Care Teams include your primary Cardiologist (physician) and Advanced Practice Providers (APPs -  Physician Assistants and Nurse Practitioners) who all work together to provide you with the care you need, when you need it.  We recommend signing up for the patient portal called "MyChart".  Sign up information is provided on this After Visit Summary.  MyChart is used to connect with patients for Virtual Visits (Telemedicine).  Patients are able to view lab/test results, encounter notes, upcoming appointments, etc.  Non-urgent messages can be sent to your provider as well.   To learn more about what you can do with MyChart, go to [email or web address].    Your next appointment:   4 week(s)  The format for your next appointment:   In Person  Provider:   You may see Dr.  Herbie Baltimore  or one of the following Advanced Practice Providers on your designated Care Team:   Nicolasa Ducking, NP Eula Listen, PA-C Marisue Ivan, PA-C Cadence Fransico Michael, [place]   Other Instructions     Your cardiac CT will be scheduled at one of the below locations:   [facility] [address] [place], [place] [address] [phone]  If scheduled at [facility], please arrive at the [facility] main entrance (entrance A) of [facility] 30 minutes prior to test start time. Proceed to the [facility] Radiology Department (first floor) to check-in and test prep.  If scheduled at [facility], please arrive 15 mins early for check-in and test prep.  Please follow these instructions carefully (unless otherwise directed):  Hold all erectile dysfunction medications at least 3 days (72 hrs) prior to test.  On the Night Before the Test: Be sure to Drink plenty of water. Do not consume any caffeinated/decaffeinated beverages or chocolate 12 hours prior to your test.  On the Day of the Test: Drink plenty of water until 1 hour prior to the test. Do not eat any food 4 hours prior to the test. You may take your regular medications prior to the test.  Take metoprolol (Lopressor) two hours prior to test.        After the Test: Drink plenty of water. After receiving IV contrast, you may experience a mild flushed feeling. This is normal. On occasion, you may experience a mild rash up to 24 hours after the  test. This is not dangerous. If this occurs, you can take Benadryl 25 mg and increase your fluid intake. If you experience trouble breathing, this can be serious. If it is severe call 911 IMMEDIATELY. If it is mild, please call our office. If you take any of these medications: Glipizide/Metformin, Avandament, Glucavance, please do not take 48 hours after completing test unless otherwise  instructed.  Please allow 2-4 weeks for scheduling of routine cardiac CTs. Some insurance companies require a pre-authorization which may delay scheduling of this test.   For non-scheduling related questions, please contact the cardiac imaging nurse navigator should you have any questions/concerns: Rockwell Alexandria, Cardiac Imaging Nurse Navigator Larey Brick, Cardiac Imaging Nurse Navigator [facility] Direct Office Dial: [phone]   For scheduling needs, including cancellations and rescheduling, please call [place], [phone].

## 2021-01-01 NOTE — Assessment & Plan Note (Signed)
Being monitored by PCP. Last A1c was 9.5.  Clearly will require adjustment of medications.

## 2021-01-01 NOTE — Assessment & Plan Note (Signed)
His chest pain is somewhat atypical and that is not necessarily located in the left parasternal region, can start in the shoulder radiate to the upper chest or lower chest.  It is also not necessarily associated with exertion.  However there is also combination of exertional dyspnea.  With all the features of metabolic syndrome including obesity, hypertension, hyperlipidemia with high triglycerides, and diabetes, ischemic evaluation is warranted.  Because there is increased risk, would prefer to evaluate with Cardiac CT Angiogram (Cardiac CTA) which allows [place] to evaluate coronary anatomy as well as a potential visualize evaluation with CT FFR.

## 2021-01-01 NOTE — Progress Notes (Signed)
Primary Care Provider: Delsa Grana, PA-C [facility] Cardiologist: None Electrophysiologist: None  Clinic Note: Chief Complaint  Patient presents with   New Patient (Initial Visit)    Ref by Delsa Grana, PA-C for left arm pain and chest tightness. Medications reviewed by the patient verbally. Patient c/o chest pain, shortness of breath with over exertion and left arm pain.     Shortness of Breath    On exertion   Chest Pain    Can occur at rest or with exertion.    ===================================  ASSESSMENT/PLAN   Problem List Items Addressed This Visit       Cardiology Problems   Hypertension associated with type 2 diabetes mellitus (HCC) (Chronic)    Blood pressures well controlled today.  He is appropriately on ACE inhibitor for diabetic.  No change      Relevant Medications   metoprolol tartrate (LOPRESSOR) 50 MG tablet   Other Relevant Orders   EKG 12-Lead   Lipid panel   Abdominal aortic atherosclerosis (HCC) (Chronic)   Relevant Medications   metoprolol tartrate (LOPRESSOR) 50 MG tablet   Other Relevant Orders   Lipid panel   Hyperlipidemia associated with type 2 diabetes mellitus ([place]) (Chronic)    Lipids were checked in February of this year.  He did have an appreciable improvement in his LDL.  He is currently on Lipitor 20 mg daily.  With concern for possible CAD we will recheck lipid panel along with chemistries as part of his preprocedural labs for Cardiac CT Angiogram (cardiac CTA).  With him adjusting his diet, and being on statin, will get a new baseline for lipids.  Also depending on results of Cardiac CTA-we may want to adjust LDL target  He is on insulin along with Jardiance for diabetes.  Jardiance, appropriate for cardiovascular benefit.      Relevant Medications   metoprolol tartrate (LOPRESSOR) 50 MG tablet     Other   Precordial pain - atypical angina    His chest pain is somewhat atypical and that is not necessarily located in  the left parasternal region, can start in the shoulder radiate to the upper chest or lower chest.  It is also not necessarily associated with exertion.  However there is also combination of exertional dyspnea.  With all the features of metabolic syndrome including obesity, hypertension, hyperlipidemia with high triglycerides, and diabetes, ischemic evaluation is warranted.  Because there is increased risk, would prefer to evaluate with Cardiac CT Angiogram (Cardiac CTA) which allows [place] to evaluate coronary anatomy as well as a potential visualize evaluation with CT FFR.      Relevant Orders   EKG 12-Lead   CT CORONARY MORPH W/CTA COR W/SCORE W/CA W/CM &/OR WO/CM   Comp Met (CMET)   Metabolic syndrome    Presence of hypertension, diabetes, obesity and hyperlipidemia meets criteria for metabolic syndrome.  This puts him at increased risk for cardiovascular disease.  At a minimum would want to do screening with a coronary calcium score, but now that he has had onset of symptoms of chest pain and exertional dyspnea, full ischemic evaluation is warranted.  Plan: Cardiac CT Angiogram for both anatomic and physiologic evaluation.      Obesity (Chronic)   Type 2 diabetes mellitus with hyperglycemia, with long-term current use of insulin (HCC) (Chronic)    Being monitored by PCP. Last A1c was 9.5.  Clearly will require adjustment of medications.      DOE (dyspnea on exertion) - Primary (Chronic)  With the combination of exertional dyspnea and chest pain and metabolic syndrome, would like to exclude ischemic CAD at first.  Will Order Coronary CT Angiogram, pending that result, may or may not check 2D echo.       Relevant Orders   EKG 12-Lead   CT CORONARY MORPH W/CTA COR W/SCORE W/CA W/CM &/OR WO/CM    ===================================  HPI:    Nathan Rojas is a 45 y.o. male with history of Hypertension, Hyperlipidemia, Diabetes Mellitus-2 is being seen today for the evaluation  of CHEST TIGHTNESS AND LEFT ARM PAIN at the request of Delsa Grana, PA-C.  Nathan Rojas was last seen on October 31, 2020 for routine clinic follow-up by Delsa Grana, PA-C.  He did note intermittent episodes of left-sided chest pain in the left upper chest described as dull, worse with exertion associated with exertional dyspnea.  Feels like he cannot breathe.  Tends to get better with rest. => He was referred to cardiology for evaluation  Recent Hospitalizations: None  Reviewed  CV studies:    The following studies were reviewed today: (if available, images/films reviewed: From Epic Chart or Care Everywhere) None:   Interval History:   Nathan Rojas presents here today for evaluation of really 2 separate symptoms that have been going on for about the last 7 months.  He prefaces by saying that he has been trying to be more active, and adjusting his diet with hopes to try to lose weight, albeit not all that successful.  The initial symptoms began as left shoulder and arm discomfort noted as a dull aching pain.  This discomfort has migrated from the left arm to the left upper chest and on occasion the left lower chest.  He describes a dull aching pain but he says that it can come and go randomly and is not necessarily exertional in nature.  When it occurs, it can last hours to days.  He can also go several days without having any.  He does not think that it is worse with exertion.  While he is try to come more active, he is started noticing that when he "overdoes it or does more than regular exertion, he will get very prominent dyspnea sensation where he feels a also burning tightness in his chest but not the same chest pain described above.  This symptom goes away with rest.  But he is quick to point out that is with "overexertion".  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    He denies any resting dyspnea and does not have dyspnea associated with the resting chest pain.  No PND orthopnea or edema.  CV Review of Symptoms (Summary) Cardiovascular ROS: positive for - chest pain, dyspnea on exertion, and least 2 symptoms not necessarily associated with each other.  Chest pain can occur at rest or with exertion, usually starts off with left arm pain.  This can last hours to days whereas the dyspnea on exertion occurs with more vigorous exertion such as dragging a deer back to his truck negative for - edema, irregular heartbeat, orthopnea, palpitations, paroxysmal nocturnal dyspnea, rapid heart rate, shortness of breath, or lightheadedness or dizziness (less associated with dyspnea), TIA/amaurosis fugax, claudication  REVIEWED OF SYSTEMS   Review of Systems  Constitutional:  Negative for malaise/fatigue and weight loss (Although he is trying to lose  weight.  Adjusting his diet, but is actually gained weight since March.).  HENT:  Negative for congestion and nosebleeds.   Respiratory:  Positive for shortness of breath (With overexertion). Negative for cough.   Cardiovascular:  Negative for claudication and leg swelling.  Gastrointestinal:  Negative for abdominal pain, blood in stool, diarrhea and melena.  Genitourinary:  Negative for hematuria.  Musculoskeletal:  Negative for joint pain and myalgias.  Neurological:  Negative for dizziness, speech change and focal weakness.  Psychiatric/Behavioral: Negative.      I have reviewed and (if needed) personally updated the patient's problem list, medications, allergies, past medical and surgical history, social and family history.   PAST MEDICAL HISTORY   Past Medical History:  Diagnosis Date   Chronic pain of multiple joints 01/23/2016   CKD (chronic kidney disease)    CRP elevated 01/23/2016   Flexor tenosynovitis of thumb 05/27/2016   Hyperlipidemia    Hypertension    Kidney stone 11/22/2014   MRSA  infection around age 43   left hand wound   Rheumatoid arthritis, 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[place]) 01/23/2016   Trigeminal neuralgia 06/03/2015   Type 2 diabetes mellitus, uncontrolled    Vitamin D deficiency 07/09/2016    PAST SURGICAL HISTORY   Past Surgical History:  Procedure Laterality Date   APPENDECTOMY     CHOLECYSTECTOMY N/A 05/06/2017   Procedure: LAPAROSCOPIC CHOLECYSTECTOMY;  Surgeon: Vickie Epley, MD;  Location: [facility] ORS;  Service: General;  Laterality: N/A;   COLONOSCOPY WITH PROPOFOL N/A 06/13/2020   Procedure: COLONOSCOPY WITH PROPOFOL;  Surgeon: Virgel Manifold, MD;  Location: [facility] ENDOSCOPY;  Service: Endoscopy;  Laterality: N/A;   TONSILLECTOMY     TONSILLECTOMY     VASECTOMY  Jan 2015    Immunization History  Administered Date(s) Administered   Influenza,inj,Quad PF,6+ Mos 04/18/2015, 01/09/2016, 01/04/2018   Influenza-Unspecified 12/21/2013   Pneumococcal Polysaccharide-23 09/14/2013   Td 03/29/2010    MEDICATIONS/ALLERGIES   Current Meds  Medication Sig   aspirin EC 81 MG tablet Take 81 mg by mouth daily.   atorvastatin (LIPITOR) 20 MG tablet TAKE 1 TABLET AT BEDTIME   cholecalciferol (VITAMIN D) 1000 units tablet Take 1 tablet (1,000 Units total) by mouth daily.   Continuous Blood Gluc Sensor (DEXCOM G6 SENSOR) MISC SMARTSIG:1 Each Topical Every 10 Days   empagliflozin (JARDIANCE) 25 MG TABS tablet Take 25 mg by mouth daily.   Insulin NPH, Human,, Isophane, (HUMULIN N) 100 UNIT/ML Kiwkpen Inject into the skin.   lisinopril (ZESTRIL) 10 MG tablet TAKE 1 TABLET DAILY   metoprolol tartrate (LOPRESSOR) 50 MG tablet Take 1 tablet (50 mg) 2 hours prior to Cardiac CTA   Multiple Vitamin (MULTIVITAMIN WITH MINERALS) TABS tablet Take 1 tablet by mouth daily.   tadalafil (CIALIS) 20 MG tablet Take 1 tablet (20 mg total) by mouth daily as needed for erectile dysfunction.   tadalafil (CIALIS) 5 MG tablet Take 1 tablet (5 mg total) by mouth daily as needed for erectile  dysfunction.    No Known Allergies  SOCIAL HISTORY/FAMILY HISTORY   Reviewed in Epic:   Social History   Tobacco Use   Smoking status: Former    Packs/day: 1.00    Years: 25.00    Pack years: 25.00    Types: Cigarettes    Start date: 03/29/1989    Quit date: 02/08/2015    Years since quitting: 5.9   Smokeless tobacco: Never  Vaping Use   Vaping Use: Never used  Substance Use Topics  Alcohol use: Not Currently   Drug use: No   Social History   Social History Narrative   Not on file   Family History  Problem Relation Age of Onset   Diabetes type II Mother        resolved after GOP   Diabetes type I Sister        type 1   Dementia Maternal Grandmother    AAA (abdominal aortic aneurysm) Maternal Grandfather    Hypertension Maternal Grandfather     OBJCTIVE -PE, EKG, labs   Wt Readings from Last 3 Encounters:  01/01/21 260 lb (117.9 kg)  10/31/20 268 lb 12.8 oz (121.9 kg)  06/13/20 250 lb (113.4 kg)    Physical Exam: BP 120/80 (BP Location: Right Arm, Patient Position: Sitting, Cuff Size: Large)   Pulse 63   Ht 6' 1.5" (1.867 m)   Wt 260 lb (117.9 kg)   BMI 33.84 kg/m  Physical Exam Vitals reviewed.  Constitutional:      General: He is not in acute distress.    Appearance: Normal appearance. He is obese. He is not ill-appearing or toxic-appearing.  HENT:     Head: Normocephalic and atraumatic.  Eyes:     Extraocular Movements: Extraocular movements intact.     Pupils: Pupils are equal, round, and reactive to light.  Neck:     Vascular: No carotid bruit.  Cardiovascular:     Rate and Rhythm: Normal rate and regular rhythm. No extrasystoles are present.    Chest Wall: PMI is not displaced.     Pulses: Normal pulses.     Heart sounds: Normal heart sounds. No murmur heard.   No friction rub. No gallop.  Pulmonary:     Effort: Pulmonary effort is normal. No respiratory distress.     Breath sounds: Normal breath sounds.  Chest:     Chest wall: No  tenderness.  Abdominal:     General: Bowel sounds are normal. There is no distension.     Palpations: Abdomen is soft. There is no mass.     Tenderness: There is no abdominal tenderness.     Comments: Obese.  No HSM or bruit.  Musculoskeletal:        General: No swelling. Normal range of motion.     Cervical back: Normal range of motion and neck supple.  Skin:    General: Skin is warm and dry.  Neurological:     General: No focal deficit present.     Mental Status: He is alert and oriented to person, place, and time.     Cranial Nerves: No cranial nerve deficit.     Gait: Gait normal.  Psychiatric:        Mood and Affect: Mood normal.        Behavior: Behavior normal.        Thought Content: Thought content normal.        Judgment: Judgment normal.    Adult ECG Report   Name: Nathan Rojas  Age: 45 y.o.  Gender: male    Rate: 63  Rhythm: normal sinus rhythm  QRS Axis: 22  PR Interval: 146  QRS Duration: 88  QTc: 403  Voltages: Normal  Conduction Disturbances: none  Other Abnormalities: none   Narrative Interpretation: Normal EKG  Rate: 63 ;  Rhythm: normal sinus rhythm and normal axis, intervals & durations ;   Narrative Interpretation: Normal EKG   Recent Labs:  reviewed  Lab Results  Component Value Date  CHOL 132 04/29/2020   HDL 30 (L) 04/29/2020   LDLCALC 79 04/29/2020   TRIG 129 04/29/2020   CHOLHDL 4.4 04/29/2020   Lab Results  Component Value Date   CREATININE 0.85 10/31/2020   BUN 15 10/31/2020   NA 136 10/31/2020   K 4.2 10/31/2020   CL 102 10/31/2020   CO2 25 10/31/2020   CBC Latest Ref Rng & Units 01/22/2020 10/12/2019 01/04/2018  WBC 3.8 - 10.8 Thousand/uL 5.7 6.8 4.9  Hemoglobin 13.2 - 17.1 g/dL 15.2 14.8 15.3  Hematocrit 38.5 - 50.0 % 44.4 42.7 45.3  Platelets 140 - 400 Thousand/uL 244 252 259    Lab Results  Component Value Date   HGBA1C 9.5 (H) 10/31/2020   Lab Results  Component Value Date   TSH 1.50 01/04/2018     ==================================================  COVID-19 Education: The signs and symptoms of COVID-19 were discussed with the patient and how to seek care for testing (follow up with PCP or arrange E-visit).    I spent a total of 26 minutes with the patient spent in direct patient consultation.  Additional time spent with chart review  / charting (studies, outside notes, etc): 20 min Total Time: 46 min  Current medicines are reviewed at length with the patient today.  (+/- concerns) none  This visit occurred during the SARS-CoV-2 public health emergency.  Safety protocols were in place, including screening questions prior to the visit, additional usage of staff PPE, and extensive cleaning of exam room while observing appropriate contact time as indicated for disinfecting solutions.  Notice: This dictation was prepared with Dragon dictation along with smart phrase technology. Any transcriptional errors that result from this process are unintentional and may not be corrected upon review.   Studies Ordered:  Orders Placed This Encounter  Procedures   CT CORONARY MORPH W/CTA COR W/SCORE W/CA W/CM &/OR WO/CM   Lipid panel   Comp Met (CMET)   EKG 12-Lead     Patient Instructions / Medication Changes & Studies & Tests Ordered   Patient Instructions  Medication Instructions:  Your physician recommends that you continue on your current medications as directed. Please refer to the Current Medication list given to you today.  *If you need a refill on your cardiac medications before your next appointment, please call your pharmacy*   Lab Work: Lipid and CMP today If you have labs (blood work) drawn today and your tests are completely normal, you will receive your results only by: [place] (if you have MyChart) OR A paper copy in the mail If you have any lab test that is abnormal or we need to change your treatment, we will call you to review the  results.   Testing/Procedures: Your physician has requested that you have cardiac CT. Cardiac computed tomography (CT) is a painless test that uses an x-ray machine to take clear, detailed pictures of your heart. For further information please visit [email or web address][email or web address]. Please follow instruction sheet as given.  Follow-Up: At [facility], you and your health needs are our priority.  As part of our continuing mission to provide you with exceptional heart care, we have created designated Provider Care Teams.  These Care Teams include your primary Cardiologist (physician) and Advanced Practice Providers (APPs -  Physician Assistants and Nurse Practitioners) who all work together to provide you with the care you need, when you need it.  Your next appointment:   4 week(s)  The format for your next appointment:   In Person  Provider:   You may see Dr. Ellyn Hack  or one of the following Advanced Practice Providers on your designated Care Team:   Murray Hodgkins, NP Christell Faith, PA-C Marrianne Mood, PA-C Cadence Kathlen Mody, [place]   Other Instructions Instructions for CT provided.   Glenetta Hew, M.D., M.S. Interventional Cardiologist   Pager # [phone] Phone # [phone] [address] [place], [place] [address]   Thank you for choosing Heartcare at [facility]!!

## 2021-01-01 NOTE — Assessment & Plan Note (Signed)
Blood pressures well controlled today.  He is appropriately on ACE inhibitor for diabetic.  No change

## 2021-01-01 NOTE — Assessment & Plan Note (Signed)
Presence of hypertension, diabetes, obesity and hyperlipidemia meets criteria for metabolic syndrome.  This puts him at increased risk for cardiovascular disease.  At a minimum would want to do screening with a coronary calcium score, but now that he has had onset of symptoms of chest pain and exertional dyspnea, full ischemic evaluation is warranted.  Plan: Cardiac CT Angiogram for both anatomic and physiologic evaluation.

## 2021-01-01 NOTE — Assessment & Plan Note (Signed)
With the combination of exertional dyspnea and chest pain and metabolic syndrome, would like to exclude ischemic CAD at first.  Will Order Coronary CT Angiogram, pending that result, may or may not check 2D echo.

## 2021-01-01 NOTE — Assessment & Plan Note (Addendum)
Lipids were checked in February of this year.  He did have an appreciable improvement in his LDL.  He is currently on Lipitor 20 mg daily.  With concern for possible CAD we will recheck lipid panel along with chemistries as part of his preprocedural labs for Cardiac CT Angiogram (cardiac CTA).  With him adjusting his diet, and being on statin, will get a new baseline for lipids.  Also depending on results of Cardiac CTA-we may want to adjust LDL target  He is on insulin along with Jardiance for diabetes.  Jardiance, appropriate for cardiovascular benefit.

## 2021-01-02 LAB — LIPID PANEL
Chol/HDL Ratio: 8 ratio — ABNORMAL HIGH (ref 0.0–5.0)
Cholesterol, Total: 199 mg/dL (ref 100–199)
HDL: 25 mg/dL — ABNORMAL LOW (ref >39)
LDL Chol Calc (NIH): 116 mg/dL — ABNORMAL HIGH (ref 0–99)
Triglycerides: 331 mg/dL — ABNORMAL HIGH (ref 0–149)
VLDL Cholesterol Cal: 58 mg/dL — ABNORMAL HIGH (ref 5–40)

## 2021-01-02 LAB — COMPREHENSIVE METABOLIC PANEL
ALT: 42 IU/L (ref 0–44)
AST: 20 IU/L (ref 0–40)
Albumin/Globulin Ratio: 1.7 (ref 1.2–2.2)
Albumin: 5 g/dL (ref 4.0–5.0)
Alkaline Phosphatase: 74 IU/L (ref 44–121)
BUN/Creatinine Ratio: 19 (ref 9–20)
BUN: 18 mg/dL (ref 6–24)
Bilirubin Total: 1.2 mg/dL (ref 0.0–1.2)
CO2: 18 mmol/L — ABNORMAL LOW (ref 20–29)
Calcium: 9.8 mg/dL (ref 8.7–10.2)
Chloride: 99 mmol/L (ref 96–106)
Creatinine, Ser: 0.94 mg/dL (ref 0.76–1.27)
Globulin, Total: 2.9 g/dL (ref 1.5–4.5)
Glucose: 324 mg/dL — ABNORMAL HIGH (ref 70–99)
Potassium: 4.8 mmol/L (ref 3.5–5.2)
Sodium: 136 mmol/L (ref 134–144)
Total Protein: 7.9 g/dL (ref 6.0–8.5)
eGFR: 101 mL/min/{1.73_m2} (ref >59)

## 2021-01-06 ENCOUNTER — Telehealth (HOSPITAL_COMMUNITY): Payer: Self-pay | Admitting: *Deleted

## 2021-01-06 NOTE — Telephone Encounter (Signed)
Attempted to call patient regarding upcoming cardiac CT appointment. °Left message on voicemail with name and callback number ° °Scarlettrose Costilow RN Navigator Cardiac Imaging °[facility] °[phone] Office °[phone] Cell ° °

## 2021-01-07 ENCOUNTER — Telehealth (HOSPITAL_COMMUNITY): Payer: Self-pay | Admitting: *Deleted

## 2021-01-07 NOTE — Telephone Encounter (Signed)
Reaching out to patient to offer assistance regarding upcoming cardiac imaging study; pt verbalizes understanding of appt date/time, parking situation and where to check in, pre-test NPO status and medications ordered, and verified current allergies; name and call back number provided for further questions should they arise  Shaine Mount RN Navigator Cardiac Imaging [facility] [phone] office [phone] cell  Patient to take 50mg metoprolol tartrate two hours prior to cardiac CT scan. 

## 2021-01-08 ENCOUNTER — Other Ambulatory Visit: Payer: Self-pay

## 2021-01-08 ENCOUNTER — Ambulatory Visit
Admission: RE | Admit: 2021-01-08 | Discharge: 2021-01-08 | Disposition: A | Discharge status: Home / Self Care | Payer: BC Managed Care – PPO | Source: Ambulatory Visit | Attending: Cardiology | Admitting: Cardiology

## 2021-01-08 DIAGNOSIS — R072 Precordial pain: Secondary | ICD-10-CM | POA: Diagnosis not present

## 2021-01-08 DIAGNOSIS — R0609 Other forms of dyspnea: Secondary | ICD-10-CM | POA: Diagnosis not present

## 2021-01-08 MED ORDER — METOPROLOL TARTRATE 5 MG/5ML IV SOLN
10.0000 mg | Freq: Once | INTRAVENOUS | Status: AC
  Administered 2021-01-08: 10 mg via INTRAVENOUS

## 2021-01-08 MED ORDER — IOHEXOL 350 MG/ML SOLN
100.0000 mL | Freq: Once | INTRAVENOUS | Status: AC | PRN
  Administered 2021-01-08: 100 mL via INTRAVENOUS

## 2021-01-08 MED ORDER — NITROGLYCERIN 0.4 MG SL SUBL
0.8000 mg | SUBLINGUAL_TABLET | Freq: Once | SUBLINGUAL | Status: AC
  Administered 2021-01-08: 0.8 mg via SUBLINGUAL

## 2021-01-08 NOTE — Progress Notes (Signed)
Patient tolerated CT well. Drank water after. Vital signs stable encourage to drink water throughout day.Reasons explained and verbalized understanding. Ambulated steady gait.  

## 2021-01-13 ENCOUNTER — Telehealth: Payer: Self-pay

## 2021-01-13 DIAGNOSIS — E1169 Type 2 diabetes mellitus with other specified complication: Secondary | ICD-10-CM

## 2021-01-13 NOTE — Telephone Encounter (Signed)
Called patient and LVM to call back so I can give him the result note as documented below.

## 2021-01-13 NOTE — Telephone Encounter (Signed)
Will also give the patient the following result note when he calls:  Coronary CTA results:  Mildly elevated coronary calcium score of 91.8.  Mild (25 to 50%) stenosis in the mid LAD, and minimal (<25%) plaque in the RCA and LCx. => No flow-limiting lesion (cholesterol plaque blockage).  Consider noncoronary artery disease related chest pain.   Recommendation is continued risk factor modification and preventative therapy.  Good news.   Bryan Lemma, MD

## 2021-01-13 NOTE — Telephone Encounter (Signed)
-----   Message from David W Harding, MD sent at 01/12/2021  7:34 PM EDT ----- Cholesterol levels: Total cholesterol 199, triglycerides 331, HDL 25 (low with LDL of 116.  Based on coronary calcium score of 91, would like to target LDL level less than 100 and trying to bring the HDL up above 35.   Lets see what the cholesterol levels look like after about 3 months of being on the atorvastatin.  If were not getting to goal, then would need to adjust and probably increase dose.  Would like to recheck BMP and fasting lipid panel in January 2023.  David Harding, MD    

## 2021-01-16 NOTE — Telephone Encounter (Signed)
Spoke with patient and reviewed lab and test results. He verbalized understanding of all information we reviewed with no questions at this time. Confirmed upcoming appointment and orders placed for repeat labs in January per provider request. Patient was agreeable with plan with no further needs at this time.

## 2021-01-16 NOTE — Telephone Encounter (Signed)
-----   Message from Marykay Lex, MD sent at 01/12/2021  7:34 PM EDT ----- Cholesterol levels: Total cholesterol 199, triglycerides 331, HDL 25 (low with LDL of 116.  Based on coronary calcium score of 91, would like to target LDL level less than 100 and trying to bring the HDL up above 35.   Lets see what the cholesterol levels look like after about 3 months of being on the atorvastatin.  If were not getting to goal, then would need to adjust and probably increase dose.  Would like to recheck BMP and fasting lipid panel in January 2023.  Bryan Lemma, MD

## 2021-01-29 ENCOUNTER — Encounter: Payer: Self-pay | Admitting: Cardiology

## 2021-01-29 ENCOUNTER — Ambulatory Visit (INDEPENDENT_AMBULATORY_CARE_PROVIDER_SITE_OTHER): Payer: BC Managed Care – PPO | Admitting: Cardiology

## 2021-01-29 ENCOUNTER — Other Ambulatory Visit: Payer: Self-pay

## 2021-01-29 VITALS — BP 122/80 | HR 71 | Ht 74.0 in | Wt 268.0 lb

## 2021-01-29 DIAGNOSIS — I2510 Atherosclerotic heart disease of native coronary artery without angina pectoris: Secondary | ICD-10-CM | POA: Diagnosis not present

## 2021-01-29 DIAGNOSIS — E1169 Type 2 diabetes mellitus with other specified complication: Secondary | ICD-10-CM | POA: Diagnosis not present

## 2021-01-29 DIAGNOSIS — R918 Other nonspecific abnormal finding of lung field: Secondary | ICD-10-CM | POA: Insufficient documentation

## 2021-01-29 DIAGNOSIS — E1159 Type 2 diabetes mellitus with other circulatory complications: Secondary | ICD-10-CM

## 2021-01-29 DIAGNOSIS — E8881 Metabolic syndrome: Secondary | ICD-10-CM | POA: Diagnosis not present

## 2021-01-29 DIAGNOSIS — R0609 Other forms of dyspnea: Secondary | ICD-10-CM | POA: Diagnosis not present

## 2021-01-29 DIAGNOSIS — I152 Hypertension secondary to endocrine disorders: Secondary | ICD-10-CM

## 2021-01-29 DIAGNOSIS — R072 Precordial pain: Secondary | ICD-10-CM

## 2021-01-29 DIAGNOSIS — E785 Hyperlipidemia, unspecified: Secondary | ICD-10-CM

## 2021-01-29 MED ORDER — ATORVASTATIN CALCIUM 80 MG PO TABS: 80.0000 mg | ORAL_TABLET | Freq: Every day | ORAL | 3 refills | Status: DC

## 2021-01-29 NOTE — Patient Instructions (Signed)
Medication Instructions:  Your physician has recommended you make the following change in your medication:   INCREASE Atorvastatin to 40 mg once a day   *If you need a refill on your cardiac medications before your next appointment, please call your pharmacy*   Lab Work: None  If you have labs (blood work) drawn today and your tests are completely normal, you will receive your results only by: MyChart Message (if you have MyChart) OR A paper copy in the mail If you have any lab test that is abnormal or we need to change your treatment, we will call you to review the results.   Testing/Procedures: None    Follow-Up: At [facility], you and your health needs are our priority.  As part of our continuing mission to provide you with exceptional heart care, we have created designated Provider Care Teams.  These Care Teams include your primary Cardiologist (physician) and Advanced Practice Providers (APPs -  Physician Assistants and Nurse Practitioners) who all work together to provide you with the care you need, when you need it.   Your next appointment:   1 year(s)  The format for your next appointment:   In Person  Provider:   You may see Dr. Bryan Lemma or one of the following Advanced Practice Providers on your designated Care Team:   Nicolasa Ducking, NP Eula Listen, PA-C Marisue Ivan, PA-C Cadence Marlow, [place]

## 2021-01-29 NOTE — Assessment & Plan Note (Signed)
He has a history of long-term smoker but quit 6 years ago.  Not unreasonable to comply with radiologist recommendations for annual follow-up noncontrast CT.  Will defer to PCP.

## 2021-01-29 NOTE — Assessment & Plan Note (Signed)
LDL goal should be less than 100 and not closer to 70 based on metabolic syndrome (diabetes, hyperlipidemia, obesity and hypertension).  Coronary CTA thankfully showed coronary calcium score less than 100 with only mild LAD disease, however, the fact that his diabetes is not adequately controlled puts him at higher risk.  Plan:  Target LDL below 70 for long-term management)-increase atorvastatin to 40 mg daily.  Agree with 81 mg aspirin.

## 2021-01-29 NOTE — Assessment & Plan Note (Signed)
Thankfully, no significant CAD noted.  LAD had 25 to 49% stenoses.  At this point, would treat as if he has obstructive disease to avoid progression.  He does have the significant risk factors of diabetes, hyperlipidemia and hypertension along with obesity (metabolic syndrome)....   Plan: Agree with 81 mg aspirin, increase atorvastatin to 40 mg.  With resting heart rate roughly 70, normal blood pressures and no active symptoms, will hold off on standing dose beta-blocker.  Continue ACE inhibitor.

## 2021-01-29 NOTE — Assessment & Plan Note (Signed)
Still has some exertional dyspnea, but not associate with chest tightness or pressure.  Likely multifactorial-obesity and deconditioning most prominent.  Not likely coronary artery disease related based on coronary CT results.

## 2021-01-29 NOTE — Assessment & Plan Note (Signed)
Blood pressure well controlled on ACE inhibitor.

## 2021-01-29 NOTE — Progress Notes (Signed)
Primary Care Provider: Danelle Berry, PA-C [facility] Cardiologist: None Electrophysiologist: None  Clinic Note: Chief Complaint  Patient presents with   PHQ-9 4 Week Follow-up    "Doing well." Medications reviewed by the patient verbally.  Test results   ===================================  ASSESSMENT/PLAN   Problem List Items Addressed This Visit       Cardiology Problems   Hypertension associated with type 2 diabetes mellitus (HCC) - Primary (Chronic)    Blood pressure well controlled on ACE inhibitor.      Relevant Medications   atorvastatin (LIPITOR) 80 MG tablet   Other Relevant Orders   EKG 12-Lead   Coronary artery disease, non-occlusive (Chronic)    Thankfully, no significant CAD noted.  LAD had 25 to 49% stenoses.  At this point, would treat as if he has obstructive disease to avoid progression.  He does have the significant risk factors of diabetes, hyperlipidemia and hypertension along with obesity (metabolic syndrome)....   Plan: Agree with 81 mg aspirin, increase atorvastatin to 40 mg. With resting heart rate roughly 70, normal blood pressures and no active symptoms, will hold off on standing dose beta-blocker. Continue ACE inhibitor.      Relevant Medications   atorvastatin (LIPITOR) 80 MG tablet   Hyperlipidemia associated with type 2 diabetes mellitus (HCC) (Chronic)    LDL goal should be less than 100 and not closer to 70 based on metabolic syndrome (diabetes, hyperlipidemia, obesity and hypertension).  Coronary CTA thankfully showed coronary calcium score less than 100 with only mild LAD disease, however, the fact that his diabetes is not adequately controlled puts him at higher risk.  Plan: Target LDL below 70 for long-term management)-increase atorvastatin to 40 mg daily. Agree with 81 mg aspirin.      Relevant Medications   atorvastatin (LIPITOR) 80 MG tablet   Other Relevant Orders   EKG 12-Lead     Other   Pulmonary nodules (Chronic)     He has a history of long-term smoker but quit 6 years ago.  Not unreasonable to comply with radiologist recommendations for annual follow-up noncontrast CT.  Will defer to PCP.      Precordial pain - atypical angina    No further episodes of chest pain now.      Relevant Orders   EKG 12-Lead   Metabolic syndrome   Relevant Orders   EKG 12-Lead   DOE (dyspnea on exertion) (Chronic)    Still has some exertional dyspnea, but not associate with chest tightness or pressure.  Likely multifactorial-obesity and deconditioning most prominent.  Not likely coronary artery disease related based on coronary CT results.      Relevant Orders   EKG 12-Lead   ===================================  HPI:    Nathan Rojas is a 45 y.o. male with history of Hypertension, Hyperlipidemia, Diabetes Mellitus-2 who presents for follow-up after initial consultation for the evaluation of CHEST TIGHTNESS AND LEFT ARM PAIN at the request of Danelle Berry, PA-C.  Nathan Rojas was seen on October 6 for consultation to evaluate 2 separate episodes of chest discomfort as well as exertional dyspnea.  He had been in the process of trying to lose weight with becoming more exercise savvy and watching his diet.  He was having couple episodes of random chest pain that were not necessarily exertional, but was also noticing some exertional dyspnea.  There is a different type of chest tightness that occurred with his dyspnea.  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  Recent Hospitalizations: None  Reviewed  CV studies:    The following studies were reviewed today: (if available, images/films reviewed: From Epic Chart or Care Everywhere) Coronary CT Angiogram 01/08/2021: Coronary Calcium Score 92.  CAD RADS 2 Mild nonobstructive disease in the LAD 25 to 49%.  Minimal disease in the RCA and LCx.   Right dominant system with normal origins. Small, nonspecific pulmonary nodules.-Consider repeat follow-up noncontrast CT in 12 months.   Hepatic steatosis  Interval History:   Nathan Rojas presents here today for follow-up after evaluation with coronary CTA. He has not had any further episodes of chest pain or pressure with rest or exertion.  He still short of breath, but not limiting.  Not having the tightness in his chest when he exerts himself anymore. No irregular heartbeats palpitations.  No PND, orthopnea or edema.   CV Review of Symptoms (Summary) Cardiovascular ROS: positive for - dyspnea on exertion negative for - chest pain, edema, irregular heartbeat, orthopnea, palpitations, paroxysmal nocturnal dyspnea, rapid heart rate, shortness of breath, or lightheadedness, dizziness or wooziness, syncope/near syncope or TIA/amaurosis fugax, claudication  REVIEWED OF SYSTEMS   Review of Systems  Constitutional:  Negative for malaise/fatigue and weight loss (His weight has been going up and down over the last several months.  Every time he wakes he is losing weight, he gained it back.).  HENT:  Negative for congestion and nosebleeds.   Respiratory:  Positive for shortness of breath (If he overexerts). Negative for cough.   Cardiovascular:  Negative for claudication and leg swelling.  Genitourinary: Negative.   Musculoskeletal: Negative.   Neurological: Negative.   Psychiatric/Behavioral: Negative.      I have reviewed and (if needed) personally updated the patient's problem list, medications, allergies, past medical and surgical history, social and family history.   PAST MEDICAL HISTORY   Past Medical History:  Diagnosis Date   Chronic pain of multiple joints 01/23/2016   CKD (chronic kidney disease)    Coronary artery disease, non-occlusive 01/09/2012   Coronary CT Angiogram: Coronary Calcium Score 92.  CAD RADS 2.  Mild-nonobstructive disease in the LAD (25-49%).  Minimal  (<25%) disease in RCA and LCx.  Right dominant system normal origins.   CRP elevated 01/23/2016   Flexor tenosynovitis of thumb 05/27/2016   Hepatic steatosis 12/2020   noted on chest CT.   Hyperlipidemia    Hypertension    Kidney stone 11/22/2014   MRSA infection around age 75   left hand wound   Rheumatoid arthritis, 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HCC) 01/23/2016   Trigeminal neuralgia 06/03/2015   Type 2 diabetes mellitus, uncontrolled    Vitamin D deficiency 07/09/2016    PAST SURGICAL HISTORY   Past Surgical History:  Procedure Laterality Date   APPENDECTOMY     CHOLECYSTECTOMY N/A 05/06/2017   Procedure: LAPAROSCOPIC CHOLECYSTECTOMY;  Surgeon: Ancil Linsey, MD;  Location: [facility] ORS;  Service: General;  Laterality: N/A;   COLONOSCOPY WITH PROPOFOL N/A 06/13/2020   Procedure: COLONOSCOPY WITH PROPOFOL;  Surgeon: Pasty Spillers, MD;  Location: [facility] ENDOSCOPY;  Service: Endoscopy;  Laterality: N/A;   TONSILLECTOMY     TONSILLECTOMY     VASECTOMY  Jan 2015    Immunization History  Administered Date(s) Administered   Influenza,inj,Quad PF,6+ Mos 04/18/2015, 01/09/2016, 01/04/2018   Influenza-Unspecified 12/21/2013   Pneumococcal Polysaccharide-23 09/14/2013   Td 03/29/2010    MEDICATIONS/ALLERGIES  Current Meds  Medication Sig   aspirin EC 81 MG tablet Take 81 mg by mouth daily.   atorvastatin (LIPITOR) 80 MG tablet Take 1 tablet (80 mg total) by mouth daily.   cholecalciferol (VITAMIN D) 1000 units tablet Take 1 tablet (1,000 Units total) by mouth daily.   Continuous Blood Gluc Sensor (DEXCOM G6 SENSOR) MISC SMARTSIG:1 Each Topical Every 10 Days   empagliflozin (JARDIANCE) 25 MG TABS tablet Take 25 mg by mouth daily.   Insulin NPH, Human,, Isophane, (HUMULIN N) 100 UNIT/ML Kiwkpen Inject into the skin.   lisinopril (ZESTRIL) 10 MG tablet TAKE 1 TABLET DAILY   Multiple Vitamin (MULTIVITAMIN WITH MINERALS) TABS tablet Take 1 tablet by mouth daily.   tadalafil (CIALIS) 20 MG  tablet Take 1 tablet (20 mg total) by mouth daily as needed for erectile dysfunction.   tadalafil (CIALIS) 5 MG tablet Take 1 tablet (5 mg total) by mouth daily as needed for erectile dysfunction.   [DISCONTINUED] atorvastatin (LIPITOR) 20 MG tablet TAKE 1 TABLET AT BEDTIME    No Known Allergies  SOCIAL HISTORY/FAMILY HISTORY   Reviewed in Epic:   Social History   Tobacco Use   Smoking status: Former    Packs/day: 1.00    Years: 25.00    Pack years: 25.00    Types: Cigarettes    Start date: 03/29/1989    Quit date: 02/08/2015    Years since quitting: 5.9   Smokeless tobacco: Never  Vaping Use   Vaping Use: Never used  Substance Use Topics   Alcohol use: Not Currently   Drug use: No   Social History   Social History Narrative   Not on file   Family History  Problem Relation Age of Onset   Diabetes type II Mother        resolved after GOP   Diabetes type I Sister        type 1   Dementia Maternal Grandmother    AAA (abdominal aortic aneurysm) Maternal Grandfather    Hypertension Maternal Grandfather     OBJCTIVE -PE, EKG, labs   Wt Readings from Last 3 Encounters:  01/29/21 268 lb (121.6 kg)  01/01/21 260 lb (117.9 kg)  10/31/20 268 lb 12.8 oz (121.9 kg)    Physical Exam: BP 122/80 (BP Location: Left Arm, Patient Position: Sitting, Cuff Size: Normal)   Pulse 71   Ht 6\' 2"  (1.88 m)   Wt 268 lb (121.6 kg)   SpO2 98%   BMI 34.41 kg/m  Physical Exam Constitutional:      General: He is not in acute distress.    Appearance: Normal appearance. He is obese. He is not toxic-appearing.  HENT:     Head: Normocephalic and atraumatic.  Neck:     Vascular: No carotid bruit.  Cardiovascular:     Rate and Rhythm: Normal rate and regular rhythm. No extrasystoles are present.    Chest Wall: PMI is not displaced.     Pulses: Normal pulses.     Heart sounds: S1 normal and S2 normal. Heart sounds are distant. No murmur heard.   No friction rub. No gallop.  Pulmonary:      Effort: Pulmonary effort is normal. No respiratory distress.     Breath sounds: Normal breath sounds. No wheezing, rhonchi or rales.  Musculoskeletal:     Cervical back: Normal range of motion and neck supple.  Skin:    General: Skin is warm and dry.  Neurological:     General:  No focal deficit present.     Mental Status: He is alert and oriented to person, place, and time.  Psychiatric:        Mood and Affect: Mood normal.        Behavior: Behavior normal.        Thought Content: Thought content normal.        Judgment: Judgment normal.    Adult ECG Report   Narrative Interpretation: Normal EKG  Rate: 71;  Rhythm: normal sinus rhythm and normal axis, intervals & durations ;   Narrative Interpretation: Normal EKG   Recent Labs:  reviewed  Lab Results  Component Value Date   CHOL 199 01/01/2021   HDL 25 (L) 01/01/2021   LDLCALC 116 (H) 01/01/2021   TRIG 331 (H) 01/01/2021   CHOLHDL 8.0 (H) 01/01/2021   Lab Results  Component Value Date   CREATININE 0.94 01/01/2021   BUN 18 01/01/2021   NA 136 01/01/2021   K 4.8 01/01/2021   CL 99 01/01/2021   CO2 18 (L) 01/01/2021   CBC Latest Ref Rng & Units 01/22/2020 10/12/2019 01/04/2018  WBC 3.8 - 10.8 Thousand/uL 5.7 6.8 4.9  Hemoglobin 13.2 - 17.1 g/dL 15.2 14.8 15.3  Hematocrit 38.5 - 50.0 % 44.4 42.7 45.3  Platelets 140 - 400 Thousand/uL 244 252 259    Lab Results  Component Value Date   HGBA1C 9.5 (H) 10/31/2020   Lab Results  Component Value Date   TSH 1.50 01/04/2018    ==================================================  COVID-19 Education: The signs and symptoms of COVID-19 were discussed with the patient and how to seek care for testing (follow up with PCP or arrange E-visit).    I spent a total of 16  minutes with the patient spent in direct patient consultation.  Additional time spent with chart review  / charting (studies, outside notes, etc): 15 min Total Time: 31 min  Current medicines are reviewed  at length with the patient today.  (+/- concerns) none  This visit occurred during the SARS-CoV-2 public health emergency.  Safety protocols were in place, including screening questions prior to the visit, additional usage of staff PPE, and extensive cleaning of exam room while observing appropriate contact time as indicated for disinfecting solutions.  Notice: This dictation was prepared with Dragon dictation along with smart phrase technology. Any transcriptional errors that result from this process are unintentional and may not be corrected upon review.   Studies Ordered:  Orders Placed This Encounter  Procedures   EKG 12-Lead      Patient Instructions / Medication Changes & Studies & Tests Ordered   Patient Instructions  Medication Instructions:  Your physician has recommended you make the following change in your medication:   INCREASE Atorvastatin to 40 mg once a day   *If you need a refill on your cardiac medications before your next appointment, please call your pharmacy*   Lab Work: None  If you have labs (blood work) drawn today and your tests are completely normal, you will receive your results only by: [place] (if you have MyChart) OR A paper copy in the mail If you have any lab test that is abnormal or we need to change your treatment, we will call you to review the results.   Testing/Procedures: None    Follow-Up: At [facility], you and your health needs are our priority.  As part of our continuing mission to provide you with exceptional heart care, we have created designated Provider Care Teams.  These Care Teams include your primary Cardiologist (physician) and Advanced Practice Providers (APPs -  Physician Assistants and Nurse Practitioners) who all work together to provide you with the care you need, when you need it.   Your next appointment:   1 year(s)  The format for your next appointment:   In Person  Provider:   You may see Dr.  Glenetta Hew or one of the following Advanced Practice Providers on your designated Care Team:   Murray Hodgkins, NP Christell Faith, PA-C Marrianne Mood, PA-C [place], [place]   Glenetta Hew, M.D., M.S. Interventional Cardiologist   Pager # [phone] Phone # [phone] [address] [place],  [address]   Thank you for choosing Heartcare at [facility]!!

## 2021-01-29 NOTE — Assessment & Plan Note (Signed)
No further episodes of chest pain now.

## 2021-02-24 DIAGNOSIS — J111 Influenza due to unidentified influenza virus with other respiratory manifestations: Secondary | ICD-10-CM | POA: Diagnosis not present

## 2021-02-24 DIAGNOSIS — Z20822 Contact with and (suspected) exposure to covid-19: Secondary | ICD-10-CM | POA: Diagnosis not present

## 2021-02-24 DIAGNOSIS — R059 Cough, unspecified: Secondary | ICD-10-CM | POA: Diagnosis not present

## 2021-04-06 ENCOUNTER — Other Ambulatory Visit (INDEPENDENT_AMBULATORY_CARE_PROVIDER_SITE_OTHER): Payer: BC Managed Care – PPO

## 2021-04-06 ENCOUNTER — Other Ambulatory Visit: Payer: Self-pay

## 2021-04-06 DIAGNOSIS — E785 Hyperlipidemia, unspecified: Secondary | ICD-10-CM | POA: Diagnosis not present

## 2021-04-06 DIAGNOSIS — E1169 Type 2 diabetes mellitus with other specified complication: Secondary | ICD-10-CM | POA: Diagnosis not present

## 2021-04-07 LAB — BASIC METABOLIC PANEL
BUN/Creatinine Ratio: 15 (ref 9–20)
BUN: 15 mg/dL (ref 6–24)
CO2: 21 mmol/L (ref 20–29)
Calcium: 9 mg/dL (ref 8.7–10.2)
Chloride: 103 mmol/L (ref 96–106)
Creatinine, Ser: 0.98 mg/dL (ref 0.76–1.27)
Glucose: 193 mg/dL — ABNORMAL HIGH (ref 70–99)
Potassium: 4.5 mmol/L (ref 3.5–5.2)
Sodium: 139 mmol/L (ref 134–144)
eGFR: 96 mL/min/{1.73_m2} (ref >59)

## 2021-04-07 LAB — LIPID PANEL
Chol/HDL Ratio: 6.2 ratio — ABNORMAL HIGH (ref 0.0–5.0)
Cholesterol, Total: 160 mg/dL (ref 100–199)
HDL: 26 mg/dL — ABNORMAL LOW (ref >39)
LDL Chol Calc (NIH): 105 mg/dL — ABNORMAL HIGH (ref 0–99)
Triglycerides: 163 mg/dL — ABNORMAL HIGH (ref 0–149)
VLDL Cholesterol Cal: 29 mg/dL (ref 5–40)

## 2021-04-13 ENCOUNTER — Telehealth: Payer: Self-pay | Admitting: Cardiology

## 2021-04-13 DIAGNOSIS — Z79899 Other long term (current) drug therapy: Secondary | ICD-10-CM

## 2021-04-13 DIAGNOSIS — E785 Hyperlipidemia, unspecified: Secondary | ICD-10-CM

## 2021-04-13 DIAGNOSIS — E1169 Type 2 diabetes mellitus with other specified complication: Secondary | ICD-10-CM

## 2021-04-13 DIAGNOSIS — I2510 Atherosclerotic heart disease of native coronary artery without angina pectoris: Secondary | ICD-10-CM

## 2021-04-13 NOTE — Telephone Encounter (Signed)
Reviewed the patient's chart.  He saw Dr. Herbie Baltimore on 01/29/21. Atorvastatin 80 mg once daily was reported on his intake medication list.  However, per MD note from 01/29/21: LDL goal should be less than 100 and not closer to 70 based on metabolic syndrome (diabetes, hyperlipidemia, obesity and hypertension).   Coronary CTA thankfully showed coronary calcium score less than 100 with only mild LAD disease, however, the fact that his diabetes is not adequately controlled puts him at higher risk.   Plan: Target LDL below 70 for long-term management)-increase atorvastatin to 40 mg daily. Agree with 81 mg aspirin.  AVS noted for the patient to increase Atorvastatin to 40 mg once daily, but a RX was sent to the pharmacy on 01/29/21 for Atorvastatin 80 mg once daily.   I called the patient and notified him of his lab results. He voices understanding of all of these results.  I asked him to please clarify how he is taking his atorvastatin or if he is taking this consistently. Per the patient: 1) He is not taking this consistently due to his work schedule (he will miss a week here and there, and then miss a couple of days) 2) He told Dr. Herbie Baltimore at his last appointment that he had 20 mg tablets of atorvastatin at home and per the patient, Dr. Herbie Baltimore advised him to take 2 tablets (40 mg) once daily. - the patient confirms he has been taking atorvastatin 20 mg- 2 tablets (40 mg) once daily when he takes this  The patient advised he has a "back log" of medications from not always taking them like he should, but did voice that he will be taking his medication more consistently.  I have advised the patient that we will: 1) Confirm with Dr. Herbie Baltimore if he should continue atorvastatin 40  mg once daily on a consistent basis, then repeat his labs  2) or does he want to increase atorvastatin to 80 mg once daily then repeat labs  The patient is aware we will call him back with further MD recommendations once  received. He voices understanding of all of the above and is agreeable. He was appreciative of the call.   I have corrected the patient's medication list to reflect how is currently taking his atorvastatin.

## 2021-04-13 NOTE — Telephone Encounter (Signed)
Leonie Man, MD  04/10/2021  6:04 PM EST     Overall, there is only a nominal reduction in cholesterol levels.   LDL only dropped to 105 from 116.   Chemistry panel is stable.  Normal kidney function.   Need to confirm what dose of atorvastatin he is taking.  My note is a 40 mg, but the prescription was written maybe for 80 mg.   If he is truly taking 80 mg of atorvastatin, then we may not really get there with oral medications alone.   For now would like to add Zetia 10 mg daily.  We can reassess labs in about 3 to 4 months.-LFTs and lipid panel.   Glenetta Hew, MD

## 2021-04-13 NOTE — Telephone Encounter (Signed)
Okay I was not sure what dose he is actually taking.  Yes I do want him to take 40 mg tablets.  As long as he can take it every day, if he misses a day he can double up the next day.  Will then recheck labs in about 4 months.   ,Bryan Lemma, MD

## 2021-04-14 NOTE — Telephone Encounter (Signed)
I spoke with the patient. I have advised him of Dr. Elissa Hefty recommendations to take atorvastatin 40 mg once daily. He is aware they he should take this on a daily basis, but if he misses a day, he may 80 mg the next day, then resume 40 mg once daily. He is aware we will need to repeat FASTING lipid panel in 4 months and that we will be in touch with him closer to that time to schedule as our lab schedule is not currently out that far.   The patient voices understanding of all of the above recommendations and is agreeable. He confirms he does have enough atorvastatin at home to take 40 mg once daily. He will call the office back if he needs to have this refilled.

## 2021-05-08 ENCOUNTER — Encounter: Payer: Self-pay | Admitting: Nurse Practitioner

## 2021-05-08 ENCOUNTER — Ambulatory Visit (INDEPENDENT_AMBULATORY_CARE_PROVIDER_SITE_OTHER): Payer: BC Managed Care – PPO | Admitting: Nurse Practitioner

## 2021-05-08 ENCOUNTER — Other Ambulatory Visit: Payer: Self-pay

## 2021-05-08 VITALS — BP 124/78 | HR 92 | Temp 97.9°F | Resp 18 | Ht 73.0 in | Wt 278.8 lb

## 2021-05-08 DIAGNOSIS — Z794 Long term (current) use of insulin: Secondary | ICD-10-CM | POA: Diagnosis not present

## 2021-05-08 DIAGNOSIS — I2510 Atherosclerotic heart disease of native coronary artery without angina pectoris: Secondary | ICD-10-CM

## 2021-05-08 DIAGNOSIS — L989 Disorder of the skin and subcutaneous tissue, unspecified: Secondary | ICD-10-CM

## 2021-05-08 DIAGNOSIS — I152 Hypertension secondary to endocrine disorders: Secondary | ICD-10-CM

## 2021-05-08 DIAGNOSIS — E1169 Type 2 diabetes mellitus with other specified complication: Secondary | ICD-10-CM | POA: Diagnosis not present

## 2021-05-08 DIAGNOSIS — E1165 Type 2 diabetes mellitus with hyperglycemia: Secondary | ICD-10-CM | POA: Diagnosis not present

## 2021-05-08 DIAGNOSIS — Z6835 Body mass index (BMI) 35.0-35.9, adult: Secondary | ICD-10-CM

## 2021-05-08 DIAGNOSIS — E785 Hyperlipidemia, unspecified: Secondary | ICD-10-CM

## 2021-05-08 DIAGNOSIS — E1159 Type 2 diabetes mellitus with other circulatory complications: Secondary | ICD-10-CM | POA: Diagnosis not present

## 2021-05-08 MED ORDER — EMPAGLIFLOZIN 25 MG PO TABS: 25.0000 mg | ORAL_TABLET | Freq: Every day | ORAL | 1 refills | Status: DC

## 2021-05-08 NOTE — Progress Notes (Signed)
BP 124/78    Pulse 92    Temp 97.9 F (36.6 C) (Oral)    Resp 18    Ht '6\' 1"'  (1.854 m)    Wt 278 lb 12.8 oz (126.5 kg)    SpO2 97%    BMI 36.78 kg/m    Subjective:    Patient ID: Nathan Rojas, male    DOB: 1975/11/27, 46 y.o.   MRN: [id number]  HPI: Nathan Rojas is a 46 y.o. male, here alone  Chief Complaint  Patient presents with   Diabetes   Hypertension   Hyperlipidemia    6 month follow up   Diabetes: Last A1C was 9.5 on 10/31/2020. Fasting glucose usually around 120s. He says he takes Insulin 30-40 units long acting. He says he buys his insulin over the counter at [employer]. He says he also takes jardiance 25 mg daily.  He was seeing Dr. Pasty Arch endocrinology but she has left the practice.  He says he needs to be set up with a new one.  Discussed it would be best to call [facility] end[facility]ology that he is established with and get established with a provider there.  Discussed long wait time for [facility].  He is going to call [place] and update [place] if he has any problems. He denies any polyuria, polydipsia or polyphagia. He is due for eye exam ordered at last visit.    Hyperlipidemia: His last LDL was 105 last month. Cardiology already addressed by increasing his atorvastatin to 40 mg daily.  He was not taking it regularly due to work schedule. He says he is taking it everyday now. He denies any myalgia.  He is going to have lipid panel done again in 4 months per cardiology.   Hypertension: He does not check his blood pressure at home. His blood pressure was 124/78   Obesity: His current weight is 278 lbs with a BMI of 36.78. 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   CAD: Last Saw cardiology on 01/29/2021.  He sees Dr. Ellyn Hack.  Coronary CTA showed coronary calcium score less than 100 and LAD had 25 to 49% stenoses. Plan is ASA 81 mg, increase atorvastatin to 40 mg daily, and continue ace inhibitor. He has labs ordered for four months to  recheck LDL.   Skin lesions: He has multiple lesions on his face, he says he has had them for awhile but would like to get them checked out.  Referral placed to dermatology.   Relevant past medical, surgical, family and social history reviewed and updated as indicated. Interim medical history since our last visit reviewed. Allergies and medications reviewed and updated.  Review of Systems  Constitutional: Negative for fever or weight change.  Respiratory: Negative for cough and shortness of breath.   Cardiovascular: Negative for chest pain or palpitations.  Gastrointestinal: Negative for abdominal pain, no bowel changes.  Musculoskeletal: Negative for gait problem or joint swelling.  Skin: Negative for rash. Positive for lesions on face Neurological: Negative for dizziness or headache.  No other specific complaints in a complete review of systems (except as listed in HPI above).      Objective:    BP 124/78    Pulse 92    Temp 97.9 F (36.6 C) (Oral)    Resp 18    Ht '6\' 1"'  (1.854 m)    Wt 278 lb 12.8 oz (126.5 kg)    SpO2 97%  BMI 36.78 kg/m   Wt Readings from Last 3 Encounters:  05/08/21 278 lb 12.8 oz (126.5 kg)  01/29/21 268 lb (121.6 kg)  01/01/21 260 lb (117.9 kg)    Physical Exam  Constitutional: Patient appears well-developed and well-nourished. Obese  No distress.  HEENT: head atraumatic, normocephalic, pupils equal and reactive to light,  neck supple Cardiovascular: Normal rate, regular rhythm and normal heart sounds.  No murmur heard. No BLE edema. Pulmonary/Chest: Effort normal and breath sounds normal. No respiratory distress. Abdominal: Soft.  There is no tenderness. Skin: multiple dark lesions to face Psychiatric: Patient has a normal mood and affect. behavior is normal. Judgment and thought content normal.   Results for orders placed or performed in visit on 04/06/21  Lipid panel  Result Value Ref Range   Cholesterol, Total 160 100 - 199 mg/dL    Triglycerides 163 (H) 0 - 149 mg/dL   HDL 26 (L) >39 mg/dL   VLDL Cholesterol Cal 29 5 - 40 mg/dL   LDL Chol Calc (NIH) 105 (H) 0 - 99 mg/dL   Chol/HDL Ratio 6.2 (H) 0.0 - 5.0 ratio  Basic metabolic panel  Result Value Ref Range   Glucose 193 (H) 70 - 99 mg/dL   BUN 15 6 - 24 mg/dL   Creatinine, Ser 0.98 0.76 - 1.27 mg/dL   eGFR 96 >59 mL/min/1.73   BUN/Creatinine Ratio 15 9 - 20   Sodium 139 134 - 144 mmol/L   Potassium 4.5 3.5 - 5.2 mmol/L   Chloride 103 96 - 106 mmol/L   CO2 21 20 - 29 mmol/L   Calcium 9.0 8.7 - 10.2 mg/dL      Assessment & Plan:   1. Type 2 diabetes mellitus with hyperglycemia, with long-term current use of insulin (HCC) -reach out to [facility] to reestablish with new provider - Hemoglobin A1c - empagliflozin (JARDIANCE) 25 MG TABS tablet; Take 1 tablet (25 mg total) by mouth daily.  Dispense: 90 tablet; Refill: 1  2. Hyperlipidemia associated with type 2 diabetes mellitus ([place]) -continue current treatment  3. Hypertension associated with type 2 diabetes mellitus ([place]) -continue current treatment, at goal  4. Class 2 severe obesity with serious comorbidity and body mass index (BMI) of 35.0 to 35.9 in adult, unspecified obesity type (HCC) -increase physical activity -monitor portion control and healthy food choices  5. Coronary artery disease, non-occlusive -keep appointments with cardiology -continue current treatment  6. Skin lesions  - Ambulatory referral to Dermatology   Follow up plan: Return in about 6 months (around 11/05/2021) for follow up.

## 2021-05-09 LAB — HEMOGLOBIN A1C
Hgb A1c MFr Bld: 9.8 % of total Hgb — ABNORMAL HIGH (ref <5.7)
Mean Plasma Glucose: 235 mg/dL
eAG (mmol/L): 13 mmol/L

## 2021-05-11 ENCOUNTER — Encounter: Payer: Self-pay | Admitting: Nurse Practitioner

## 2021-05-13 ENCOUNTER — Other Ambulatory Visit: Payer: Self-pay | Admitting: Nurse Practitioner

## 2021-05-13 ENCOUNTER — Telehealth: Payer: Self-pay

## 2021-05-13 DIAGNOSIS — E1165 Type 2 diabetes mellitus with hyperglycemia: Secondary | ICD-10-CM

## 2021-05-13 DIAGNOSIS — Z794 Long term (current) use of insulin: Secondary | ICD-10-CM

## 2021-05-13 MED ORDER — OZEMPIC (0.25 OR 0.5 MG/DOSE) 2 MG/1.5ML ~~LOC~~ SOPN: 0.2500 mg | PEN_INJECTOR | SUBCUTANEOUS | 1 refills | Status: DC

## 2021-05-13 NOTE — Telephone Encounter (Signed)
Copied from [place] [id number]. Topic: Quick Communication - Rx Refill/Question >> May 13, 2021 12:06 PM Pawlus, Brayton Layman A wrote: [facility] stating they do not have Semaglutide,0.25 or 0.5MG /DOS, (OZEMPIC, 0.25 OR 0.5 MG/DOSE,) 2 MG/1.5ML SOPN, caller requested this Rx be sent to a different pharmacy that can make this compound, please advise.

## 2021-05-13 NOTE — Telephone Encounter (Signed)
Pt would like for it to be send over to [facility] in [place], please and Thank you1

## 2021-06-18 ENCOUNTER — Encounter: Payer: Self-pay | Admitting: Emergency Medicine

## 2021-06-18 ENCOUNTER — Ambulatory Visit
Admission: EM | Admit: 2021-06-18 | Discharge: 2021-06-18 | Disposition: A | Discharge status: Home / Self Care | Payer: BC Managed Care – PPO | Attending: Emergency Medicine | Admitting: Emergency Medicine

## 2021-06-18 ENCOUNTER — Other Ambulatory Visit: Payer: Self-pay

## 2021-06-18 DIAGNOSIS — H938X1 Other specified disorders of right ear: Secondary | ICD-10-CM

## 2021-06-18 MED ORDER — FLUTICASONE PROPIONATE 50 MCG/ACT NA SUSP: 2.0000 | Freq: Every day | NASAL | 2 refills | Status: DC

## 2021-06-18 NOTE — ED Provider Notes (Signed)
?[facility] ? ? ? ?CSN: [id number] ?Arrival date & time: 06/18/21  1050 ? ? ?  ? ?History   ?Chief Complaint ?Chief Complaint  ?Patient presents with  ? Ear Fullness  ? ? ?HPI ?Nathan Rojas is a 46 y.o. male. Pt presents with right ear popping x 1 month.  Reports it feels like increased pressure and then ear popping like when on an airplane.  Denies seasonal allergies, allergic rhinitis, nasal congestion or rhinorrhea, postnasal drainage.  Denies pain.  Denies fever or chills.  Popping/pressure does not happen every day but has been increasing in frequency over the last month.  Does not happen in left ear only and right.  Does not impact his hearing. ? ? ?Ear Fullness ? ? ?Past Medical History:  ?Diagnosis Date  ? Chronic pain of multiple joints 01/23/2016  ? CKD (chronic kidney disease)   ? Coronary artery disease, non-occlusive 01/09/2012  ? Coronary CT Angiogram: Coronary Calcium Score 92.  CAD RADS 2.  Mild-nonobstructive disease in the LAD (25-49%).  Minimal (<25%) disease in RCA and LCx.  Right dominant system normal origins.  ? CRP elevated 01/23/2016  ? Flexor tenosynovitis of thumb 05/27/2016  ? Hepatic steatosis 12/2020  ? noted on chest CT.  ? Hyperlipidemia   ? Hypertension   ? Kidney stone 11/22/2014  ? MRSA infection around age 89  ? left hand wound  ? Rheumatoid arthritis, 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[place]) 01/23/2016  ? Trigeminal neuralgia 06/03/2015  ? Type 2 diabetes mellitus, uncontrolled   ? Vitamin D deficiency 07/09/2016  ? ? ?Patient Active Problem List  ? Diagnosis Date Noted  ? Pulmonary nodules 01/29/2021  ? DOE (dyspnea on exertion) 01/01/2021  ? Precordial pain - atypical angina 01/01/2021  ? Metabolic syndrome [id number]  ? Hypertension associated with type 2 diabetes mellitus ([place]) 05/01/2018  ? Type 2 diabetes mellitus with hyperglycemia, with long-term current use of insulin ([place]) 05/01/2018  ? Preventative health care 01/10/2018  ? Erectile dysfunction 04/27/2017  ? Encounter for  long-term (current) use of high-risk medication 03/05/2016  ? Rheumatoid arthritis, 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[place]) 01/23/2016  ? Obesity 04/18/2015  ? Abdominal aortic atherosclerosis ([place]) 11/16/2014  ? Hyperlipidemia associated with type 2 diabetes mellitus ([place])   ? Coronary artery disease, non-occlusive 01/09/2012  ? ? ?Past Surgical History:  ?Procedure Laterality Date  ? APPENDECTOMY    ? CHOLECYSTECTOMY N/A 05/06/2017  ? Procedure: LAPAROSCOPIC CHOLECYSTECTOMY;  Surgeon: Vickie Epley, MD;  Location: [facility] ORS;  Service: General;  Laterality: N/A;  ? COLONOSCOPY WITH PROPOFOL N/A 06/13/2020  ? Procedure: COLONOSCOPY WITH PROPOFOL;  Surgeon: Virgel Manifold, MD;  Location: [facility] ENDOSCOPY;  Service: Endoscopy;  Laterality: N/A;  ? TONSILLECTOMY    ? TONSILLECTOMY    ? VASECTOMY  Jan 2015  ? ? ? ? ? ?Home Medications   ? ?Prior to Admission medications   ?Medication Sig Start Date End Date Taking? Authorizing Provider  ?fluticasone (FLONASE) 50 MCG/ACT nasal spray Place 2 sprays into both nostrils daily. 06/18/21  Yes Carvel Getting, NP  ?aspirin EC 81 MG tablet Take 81 mg by mouth daily.    [provider]  ?atorvastatin (LIPITOR) 20 MG tablet Take 2 tablets (40 mg) by mouth once daily    [provider]  ?cholecalciferol (VITAMIN D) 1000 units tablet Take 1 tablet (1,000 Units total) by mouth daily. 01/04/18   Arnetha Courser, MD  ?empagliflozin (JARDIANCE) 25 MG TABS tablet Take 1 tablet (25 mg total) by mouth  daily. 05/08/21   Bo Merino, FNP  ?Insulin NPH, Human,, Isophane, (HUMULIN N) 100 UNIT/ML Kiwkpen Inject into the skin.    [provider]  ?lisinopril (ZESTRIL) 10 MG tablet TAKE 1 TABLET DAILY 10/20/20   Delsa Grana, PA-C  ?Multiple Vitamin (MULTIVITAMIN WITH MINERALS) TABS tablet Take 1 tablet by mouth daily.    [provider]  ?Semaglutide,0.25 or 0.5MG /DOS, (OZEMPIC, 0.25 OR 0.5 MG/DOSE,) 2 MG/1.5ML SOPN Inject 0.25-0.5 mg into the skin once a week. Start at 0.25  mg weekly for four weeks, then increase to 0.5mg  weekly 05/13/21   Bo Merino, FNP  ?tadalafil (CIALIS) 20 MG tablet Take 1 tablet (20 mg total) by mouth daily as needed for erectile dysfunction. 04/10/20   Zara Council A, PA-C  ?tadalafil (CIALIS) 5 MG tablet Take 1 tablet (5 mg total) by mouth daily as needed for erectile dysfunction. 04/10/20   Nori Riis, PA-C  ? ? ?Family History ?Family History  ?Problem Relation Age of Onset  ? Diabetes type II Mother   ?     resolved after GOP  ? Diabetes type I Sister   ?     type 1  ? Dementia Maternal Grandmother   ? AAA (abdominal aortic aneurysm) Maternal Grandfather   ? Hypertension Maternal Grandfather   ? ? ?Social History ?Social History  ? ?Tobacco Use  ? Smoking status: Former  ?  Packs/day: 1.00  ?  Years: 25.00  ?  Pack years: 25.00  ?  Types: Cigarettes  ?  Start date: 03/29/1989  ?  Quit date: 02/08/2015  ?  Years since quitting: 6.3  ? Smokeless tobacco: Never  ?Vaping Use  ? Vaping Use: Never used  ?Substance Use Topics  ? Alcohol use: Not Currently  ? Drug use: No  ? ? ? ?Allergies   ?Patient has no known allergies. ? ? ?Review of Systems ?Review of Systems ? ? ?Physical Exam ?Triage Vital Signs ?ED Triage Vitals  ?Enc Vitals Group  ?   BP 06/18/21 1058 115/79  ?   Pulse Rate 06/18/21 1058 86  ?   Resp 06/18/21 1058 16  ?   Temp 06/18/21 1058 98.3 ?F (36.8 ?C)  ?   Temp Source 06/18/21 1058 Oral  ?   SpO2 06/18/21 1058 95 %  ?   Weight --   ?   Height --   ?   Head Circumference --   ?   Peak Flow --   ?   Pain Score 06/18/21 1057 0  ?   Pain Loc --   ?   Pain Edu? --   ?   Excl. in [place]? --   ? ?No data found. ? ?Updated Vital Signs ?BP 115/79 (BP Location: Right Arm)   Pulse 86   Temp 98.3 ?F (36.8 ?C) (Oral)   Resp 16   SpO2 95%  ? ?Visual Acuity ?Right Eye Distance:   ?Left Eye Distance:   ?Bilateral Distance:   ? ?Right Eye Near:   ?Left Eye Near:    ?Bilateral Near:    ? ?Physical Exam ?Constitutional:   ?   Appearance: Normal  appearance. He is not ill-appearing.  ?HENT:  ?   Right Ear: Tympanic membrane, ear canal and external ear normal.  ?   Left Ear: Tympanic membrane, ear canal and external ear normal.  ?   Nose: Nose normal.  ?Pulmonary:  ?   Effort: Pulmonary effort is normal.  ?Neurological:  ?  Mental Status: He is alert.  ? ? ? ?UC Treatments / Results  ?Labs ?(all labs ordered are listed, but only abnormal results are displayed) ?Labs Reviewed - No data to display ? ?EKG ? ? ?Radiology ?No results found. ? ?Procedures ?Procedures (including critical care time) ? ?Medications Ordered in UC ?Medications - No data to display ? ?Initial Impression / Assessment and Plan / UC Course  ?I have reviewed the triage vital signs and the nursing notes. ? ?Pertinent labs & imaging results that were available during my care of the patient were reviewed by me and considered in my medical decision making (see chart for details). ? ?  ?Not sure what is causing his symptoms.  We discussed treatment options and settled on trying Flonase nasal spray to see if that helps make a difference.  Patient also to follow-up with ENT. ? ?Final Clinical Impressions(s) / UC Diagnoses  ? ?Final diagnoses:  ?Ear fullness, right  ? ? ? ?Discharge Instructions   ? ?  ?Use flonase 2 sprays every day. Follow up with [facility] (or check with your insurance for an in-network provider if [facility] is out of network) ? ? ?ED Prescriptions   ? ? Medication Sig Dispense Auth. Provider  ? fluticasone (FLONASE) 50 MCG/ACT nasal spray Place 2 sprays into both nostrils daily. 16 g Carvel Getting, NP  ? ?  ? ?PDMP not reviewed this encounter. ?  ?Carvel Getting, NP ?06/18/21 1136 ? ?

## 2021-06-18 NOTE — ED Triage Notes (Signed)
Pt presents with right ear popping and muffled x 1 month.  ?

## 2021-06-18 NOTE — Discharge Instructions (Signed)
Use flonase 2 sprays every day. Follow up with [facility] (or check with your insurance for an in-network provider if [facility] is out of network) ?

## 2021-06-28 ENCOUNTER — Other Ambulatory Visit: Payer: Self-pay | Admitting: Nurse Practitioner

## 2021-06-28 DIAGNOSIS — E1165 Type 2 diabetes mellitus with hyperglycemia: Secondary | ICD-10-CM

## 2021-06-30 NOTE — Telephone Encounter (Signed)
Requested medication (s) are due for refill today: yes ? ?Requested medication (s) are on the active medication list: yes ? ?Last refill:  05/13/21 #1.68mL/1 ? ?Future visit scheduled: no ? ?Notes to clinic:   ?Pharmacy comment: Alternative Requested:NEED NEW RX FOR NEW FORMULARY. 3 ML.  ?   ? ? ? ?  ?Requested Prescriptions  ?Pending Prescriptions Disp Refills  ? OZEMPIC, 0.25 OR 0.5 MG/DOSE, 2 MG/3ML SOPN [Pharmacy Med Name: OZEMPIC 0.25-0.5 MG/DOSE PEN]  0  ?  ? There is no refill protocol information for this order  ?  ? ?

## 2021-07-20 DIAGNOSIS — E1169 Type 2 diabetes mellitus with other specified complication: Secondary | ICD-10-CM | POA: Diagnosis not present

## 2021-07-20 DIAGNOSIS — E785 Hyperlipidemia, unspecified: Secondary | ICD-10-CM | POA: Diagnosis not present

## 2021-07-20 DIAGNOSIS — E1165 Type 2 diabetes mellitus with hyperglycemia: Secondary | ICD-10-CM | POA: Diagnosis not present

## 2021-07-24 DIAGNOSIS — E291 Testicular hypofunction: Secondary | ICD-10-CM | POA: Diagnosis not present

## 2021-07-24 DIAGNOSIS — Z125 Encounter for screening for malignant neoplasm of prostate: Secondary | ICD-10-CM | POA: Diagnosis not present

## 2021-08-03 DIAGNOSIS — I10 Essential (primary) hypertension: Secondary | ICD-10-CM | POA: Diagnosis not present

## 2021-08-03 DIAGNOSIS — E119 Type 2 diabetes mellitus without complications: Secondary | ICD-10-CM | POA: Diagnosis not present

## 2021-08-03 DIAGNOSIS — E7800 Pure hypercholesterolemia, unspecified: Secondary | ICD-10-CM | POA: Diagnosis not present

## 2021-08-03 DIAGNOSIS — E291 Testicular hypofunction: Secondary | ICD-10-CM | POA: Diagnosis not present

## 2021-08-25 ENCOUNTER — Other Ambulatory Visit: Payer: Self-pay | Admitting: Nurse Practitioner

## 2021-08-25 DIAGNOSIS — Z794 Long term (current) use of insulin: Secondary | ICD-10-CM

## 2021-08-25 DIAGNOSIS — E1165 Type 2 diabetes mellitus with hyperglycemia: Secondary | ICD-10-CM

## 2021-08-26 NOTE — Telephone Encounter (Signed)
Requested medication (s) are due for refill today: yes  Requested medication (s) are on the active medication list: yes  Last refill:  07/01/21 #3 ml 1 refill  Future visit scheduled: no  Notes to clinic:  last OV note 05/08/21 documents patient to reach out to [place] [facility] to reestablish with new provider. Do you want to refill Rx?     Requested Prescriptions  Pending Prescriptions Disp Refills   OZEMPIC, 0.25 OR 0.5 MG/DOSE, 2 MG/3ML SOPN [Pharmacy Med Name: OZEMPIC 0.25-0.5 MG/DOSE PEN]  1    Sig: Inject 0.5 mg into the skin once a week.     There is no refill protocol information for this order

## 2021-09-03 DIAGNOSIS — Z79890 Hormone replacement therapy (postmenopausal): Secondary | ICD-10-CM | POA: Diagnosis not present

## 2021-09-03 DIAGNOSIS — E291 Testicular hypofunction: Secondary | ICD-10-CM | POA: Diagnosis not present

## 2021-09-14 DIAGNOSIS — E291 Testicular hypofunction: Secondary | ICD-10-CM | POA: Diagnosis not present

## 2021-09-14 DIAGNOSIS — R6882 Decreased libido: Secondary | ICD-10-CM | POA: Diagnosis not present

## 2021-09-14 DIAGNOSIS — I10 Essential (primary) hypertension: Secondary | ICD-10-CM | POA: Diagnosis not present

## 2021-09-14 DIAGNOSIS — Z6834 Body mass index (BMI) 34.0-34.9, adult: Secondary | ICD-10-CM | POA: Diagnosis not present

## 2021-09-18 ENCOUNTER — Telehealth: Payer: Self-pay | Admitting: Family Medicine

## 2021-09-22 ENCOUNTER — Other Ambulatory Visit: Payer: Self-pay | Admitting: Family Medicine

## 2021-09-22 ENCOUNTER — Ambulatory Visit (INDEPENDENT_AMBULATORY_CARE_PROVIDER_SITE_OTHER): Payer: BC Managed Care – PPO | Admitting: Urology

## 2021-09-22 ENCOUNTER — Encounter: Payer: Self-pay | Admitting: Urology

## 2021-09-22 ENCOUNTER — Ambulatory Visit: Payer: BC Managed Care – PPO | Admitting: Urology

## 2021-09-22 VITALS — BP 133/88 | HR 80 | Ht 73.0 in | Wt 274.0 lb

## 2021-09-22 DIAGNOSIS — N5201 Erectile dysfunction due to arterial insufficiency: Secondary | ICD-10-CM

## 2021-09-22 DIAGNOSIS — E1165 Type 2 diabetes mellitus with hyperglycemia: Secondary | ICD-10-CM

## 2021-09-22 MED ORDER — TADALAFIL 5 MG PO TABS: 5.0000 mg | ORAL_TABLET | Freq: Every day | ORAL | 3 refills | Status: DC | PRN

## 2021-09-22 MED ORDER — TADALAFIL 20 MG PO TABS: 20.0000 mg | ORAL_TABLET | Freq: Every day | ORAL | 3 refills | Status: DC | PRN

## 2021-09-22 NOTE — Progress Notes (Signed)
09/22/21 8:37 AM   Nathan Rojas [birth year] [id number]  Referring provider:  Danelle Berry, PA-C [address] [place],  [place] [address]  Urological history  1. 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 - Detumescence was able to be achieved after Winter shunt was completed.  2. Nephrolithiasis  -spontaneous passage of left UVJ, 2016   3. Undesired fertility  -vasectomy, 2015   4. Scrotal mass  -scrotal US, 2004 -2.4 X 2.4 X 2.3 CM IN DIAMETER COMPLICATED MASS/NODULE INFERIOR TO THE RIGHT TESTIS  -resolved  5. Hydroceles  -scrotal US, 2004 0- SMALL BILATERAL HYDROCELES   6. Epididymal cyst  -scrotal US, 2004 - TINY SPERMATOCELE VERSUS EPIDIDYMAL CYST RIGHT EPIDIDYMAL HEAD.      Chief Complaint  Patient presents with   Erectile Dysfunction     HPI: Nathan Rojas is a 46 y.o.male who presents today for a 1 year follow-up.   He is having satisfactory erections more than not on the tadalafil 5 mg daily with supplementing with the tadalafil 20 mg, on-demand-dosing.  Patient is not having spontaneous erections.  He denies any pain or curvature with erections.    He has no issues with urination at this time.  Patient denies any modifying or aggravating factors.  Patient denies any gross hematuria, dysuria or suprapubic/flank pain.  Patient denies any fevers, chills,  nausea or vomiting.    SHIM     Row Name 09/22/21 0817         SHIM: Over the last 6 months:   How do you rate your confidence that you could get and keep an erection? Moderate     When you had erections with sexual stimulation, how often were your erections hard enough for penetration (entering your partner)? Most Times (much more than half the time)     During sexual intercourse, how often were you able to maintain your erection after you had penetrated (entered) your partner? A Few Times (much less than half the time)     During sexual intercourse, how difficult was it to maintain your erection to completion of intercourse? Difficult     When you attempted sexual intercourse, how often was it satisfactory for you? A Few Times (much less than half the time)       SHIM Total Score   SHIM 14              Score: 1-7 Severe ED 8-11 Moderate ED 12-16 Mild-Moderate ED 17-21 Mild ED 22-25 No ED    PMH: Past Medical History:  Diagnosis Date   Chronic pain of multiple joints 01/23/2016   CKD (chronic kidney disease)    Coronary artery disease, non-occlusive 01/09/2012   Coronary CT Angiogram: Coronary Calcium Score 92.  CAD RADS 2.  Mild-nonobstructive disease in the LAD (25-49%).  Minimal (<25%) disease in RCA and LCx.  Right dominant system normal origins.   CRP elevated 01/23/2016   Flexor  tenosynovitis of thumb 05/27/2016   Hepatic steatosis 12/2020   noted on chest CT.   Hyperlipidemia    Hypertension    Kidney stone 11/22/2014   MRSA infection around age 17   left hand wound   Rheumatoid arthritis, 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HCC) 01/23/2016   Trigeminal neuralgia 06/03/2015   Type 2 diabetes mellitus, uncontrolled    Vitamin D deficiency 07/09/2016    Surgical History: Past Surgical History:  Procedure Laterality Date   APPENDECTOMY     CHOLECYSTECTOMY N/A 05/06/2017   Procedure: LAPAROSCOPIC CHOLECYSTECTOMY;  Surgeon: Ancil Linsey, MD;  Location: [facility] ORS;  Service:  General;  Laterality: N/A;   COLONOSCOPY WITH PROPOFOL N/A 06/13/2020   Procedure: COLONOSCOPY WITH PROPOFOL;  Surgeon: Pasty Spillers, MD;  Location: [facility] ENDOSCOPY;  Service: Endoscopy;  Laterality: N/A;   TONSILLECTOMY     TONSILLECTOMY     VASECTOMY  Jan 2015    Home Medications:  Allergies as of 09/22/2021   No Known Allergies      Medication List        Accurate as of September 22, 2021  8:37 AM. If you have any questions, ask your nurse or doctor.          anastrozole 1 MG tablet Commonly known as: ARIMIDEX Take 1 mg by mouth once a week.   aspirin EC 81 MG tablet Take 81 mg by mouth daily.   atorvastatin 20 MG tablet Commonly known as: LIPITOR Take 2 tablets (40 mg) by mouth once daily   cholecalciferol 25 MCG (1000 UNIT) tablet Commonly known as: VITAMIN D Take 1 tablet (1,000 Units total) by mouth daily.   empagliflozin 25 MG Tabs tablet Commonly known as: JARDIANCE Take 1 tablet (25 mg total) by mouth daily.   fluticasone 50 MCG/ACT nasal spray Commonly known as: FLONASE Place 2 sprays into both nostrils daily.   Insulin NPH (Human) (Isophane) 100 UNIT/ML Kiwkpen Commonly known as: HUMULIN N Inject into the skin.   lisinopril 10 MG tablet Commonly known as: ZESTRIL TAKE 1 TABLET DAILY   multivitamin with minerals Tabs tablet Take 1 tablet by mouth daily.   Ozempic (0.25 or 0.5 MG/DOSE) 2 MG/3ML Sopn Generic drug: Semaglutide(0.25 or 0.5MG /DOS) INJECT 0.5 MG INTO THE SKIN ONCE A WEEK.   tadalafil 5 MG tablet Commonly known as: Cialis Take 1 tablet (5 mg total) by mouth daily as needed for erectile dysfunction.   tadalafil 20 MG tablet Commonly known as: CIALIS Take 1 tablet (20 mg total) by mouth daily as needed for erectile dysfunction.   Evaristo Bury FlexTouch 200 UNIT/ML FlexTouch Pen Generic drug: insulin degludec SMARTSIG:60 Unit(s) SUB-Q Every Night        Allergies:  No Known Allergies  Family History: Family History   Problem Relation Age of Onset   Diabetes type II Mother        resolved after GOP   Diabetes type I Sister        type 1   Dementia Maternal Grandmother    AAA (abdominal aortic aneurysm) Maternal Grandfather    Hypertension Maternal Grandfather     Social History:  reports that he quit smoking about 6 years ago. His smoking use included cigarettes. He started smoking about 32 years ago. He has a 25.00 pack-year smoking history. He has never used smokeless tobacco. He reports that he does not currently use alcohol. He reports that he does not use drugs.   Physical Exam: BP 133/88   Pulse 80   Ht  6\' 1"  (1.854 m)   Wt 274 lb (124.3 kg)   BMI 36.15 kg/m   Constitutional:  Well nourished. Alert and oriented, No acute distress. HEENT: [place] AT, moist mucus membranes.  Trachea midline Cardiovascular: No clubbing, cyanosis, or edema. Respiratory: Normal respiratory effort, no increased work of breathing. Neurologic: Grossly intact, no focal deficits, moving all 4 extremities. Psychiatric: Normal mood and affect.   Laboratory Data: Lab Results  Component Value Date   CREATININE 0.98 04/06/2021   Lab Results  Component Value Date   HGBA1C 9.8 (H) 05/08/2021   Component     Latest Ref Rng 04/06/2021  HDL Cholesterol     >39 mg/dL 26 (L)   Triglycerides     0 - 149 mg/dL 063 (H)   Total CHOL/HDL Ratio     0.0 - 5.0 ratio 6.2 (H)   Cholesterol, Total     100 - 199 mg/dL 016   VLDL Cholesterol Cal     5 - 40 mg/dL 29   LDL Chol Calc (NIH)     0 - 99 mg/dL 010 (H)     Legend: (L) Low (H) High  Component     Latest Ref Rng 04/06/2021  Glucose     70 - 99 mg/dL 932 (H)   BUN     6 - 24 mg/dL 15   Creatinine     3.55 - 1.27 mg/dL 7.32   BUN/Creatinine Ratio     9 - 20  15   Sodium     134 - 144 mmol/L 139   Potassium     3.5 - 5.2 mmol/L 4.5   Chloride     96 - 106 mmol/L 103   CO2     20 - 29 mmol/L 21   Calcium     8.7 - 10.2 mg/dL 9.0   eGFR     >20  UR/KYH/0.62 96     Legend: (H) High I have reviewed the labs.    Pertinent Imaging: N/A  Assessment & Plan:    1. 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 as he is interested in the pellets for testosterone replacement therapy because it is worked well with his father -I explained that we also do Testopel pellets in this clinic, but he would have to meet criteria for low testosterone to qualify for pellet insertion and his testosterone level was normal in 2021 -He stated he would like to continue forward with [facility] regarding his testosterone replacement therapy -He will continue tadalafil 5 mg daily with supplementation of 20 mg on-demand Cialis on days for intercourse -He is not interested in pursuing ICI with his history of priapism  Return in about 1 year (around 09/23/2022) for SHIM .  [facility] [address], [address] [place], [place] [address] [phone]  I, Nemiah Commander, acting as a Neurosurgeon for [facility], PA-C.,have documented all relevant documentation on the behalf of Kutler Vanvranken, PA-C,as directed by  [facility], PA-C while in the presence of Paitlyn Mcclatchey, PA-C.  I have reviewed the above documentation for accuracy and completeness, and I agree with the above.  Michiel Cowboy, PA-C   I spent 30 minutes on the day of the encounter to include pre-visit record review, face-to-face time with the patient, and post-visit ordering of tests.

## 2021-09-26 ENCOUNTER — Other Ambulatory Visit: Payer: Self-pay | Admitting: Family Medicine

## 2021-09-26 DIAGNOSIS — E1165 Type 2 diabetes mellitus with hyperglycemia: Secondary | ICD-10-CM

## 2021-10-01 ENCOUNTER — Other Ambulatory Visit: Payer: Self-pay | Admitting: Nurse Practitioner

## 2021-10-01 DIAGNOSIS — E1165 Type 2 diabetes mellitus with hyperglycemia: Secondary | ICD-10-CM

## 2021-10-01 MED ORDER — SEMAGLUTIDE (1 MG/DOSE) 4 MG/3ML ~~LOC~~ SOPN: 1.0000 mg | PEN_INJECTOR | SUBCUTANEOUS | 0 refills | Status: DC

## 2021-10-07 ENCOUNTER — Telehealth: Payer: Self-pay | Admitting: Family Medicine

## 2021-10-07 DIAGNOSIS — E1165 Type 2 diabetes mellitus with hyperglycemia: Secondary | ICD-10-CM

## 2021-10-07 NOTE — Telephone Encounter (Signed)
Discontinued 10/01/21 Requested Prescriptions  Pending Prescriptions Disp Refills  . OZEMPIC, 0.25 OR 0.5 MG/DOSE, 2 MG/3ML SOPN [Pharmacy Med Name: OZEMPIC 0.25-0.5 MG/DOSE PEN]      Sig: INJECT 0.5 MG INTO THE SKIN ONCE A WEEK.     There is no refill protocol information for this order

## 2021-10-15 DIAGNOSIS — E1165 Type 2 diabetes mellitus with hyperglycemia: Secondary | ICD-10-CM | POA: Diagnosis not present

## 2021-10-15 LAB — PROTEIN / CREATININE RATIO, URINE
Albumin, U: <7
Creatinine, Urine: 84.6

## 2021-10-15 LAB — MICROALBUMIN / CREATININE URINE RATIO: Microalb Creat Ratio: 8.3

## 2021-10-15 LAB — HEMOGLOBIN A1C: Hemoglobin A1C: 6.4

## 2021-10-19 ENCOUNTER — Other Ambulatory Visit: Payer: Self-pay

## 2021-10-19 DIAGNOSIS — E1165 Type 2 diabetes mellitus with hyperglycemia: Secondary | ICD-10-CM

## 2021-10-19 DIAGNOSIS — M654 Radial styloid tenosynovitis [de Quervain]: Secondary | ICD-10-CM | POA: Diagnosis not present

## 2021-10-19 MED ORDER — SEMAGLUTIDE (1 MG/DOSE) 4 MG/3ML ~~LOC~~ SOPN: 1.0000 mg | PEN_INJECTOR | SUBCUTANEOUS | 0 refills | Status: DC

## 2021-10-19 NOTE — Telephone Encounter (Signed)
Pt called to get refill for his OZEMPIC three weeks ago and never received anything / please advise/ pt mentioned a new dose / please call pt asap to advise   Semaglutide, 1 MG/DOSE, 4 MG/3ML SOPN

## 2021-10-21 ENCOUNTER — Encounter: Payer: Self-pay | Admitting: Dermatology

## 2021-10-21 ENCOUNTER — Ambulatory Visit (INDEPENDENT_AMBULATORY_CARE_PROVIDER_SITE_OTHER): Payer: BC Managed Care – PPO | Admitting: Dermatology

## 2021-10-21 DIAGNOSIS — L820 Inflamed seborrheic keratosis: Secondary | ICD-10-CM

## 2021-10-21 DIAGNOSIS — L720 Epidermal cyst: Secondary | ICD-10-CM

## 2021-10-21 DIAGNOSIS — L578 Other skin changes due to chronic exposure to nonionizing radiation: Secondary | ICD-10-CM | POA: Diagnosis not present

## 2021-10-21 NOTE — Patient Instructions (Addendum)
Cryotherapy Aftercare  Wash gently with soap and water everyday.   Apply Vaseline and Band-Aid daily until healed.    Face/clogged pores: Recommend using OTC Differin 0.1% gel. Apply to face at bedtime, wash off in morning  Topical retinoid medications like tretinoin/Retin-A, adapalene/Differin, tazarotene/Fabior, and Epiduo/Epiduo Forte can cause dryness and irritation when first started. Only apply a pea-sized amount to the entire affected area. Avoid applying it around the eyes, edges of mouth and creases at the nose. If you experience irritation, use a good moisturizer first and/or apply the medicine less often. If you are doing well with the medicine, you can increase how often you use it until you are applying every night. Be careful with sun protection while using this medication as it can make you sensitive to the sun. This medicine should not be used by pregnant women.      Recommend daily broad spectrum sunscreen SPF 30+ to sun-exposed areas, reapply every 2 hours as needed. Call for new or changing lesions.  Staying in the shade or wearing long sleeves, sun glasses (UVA+UVB protection) and wide brim hats (4-inch brim around the entire circumference of the hat) are also recommended for sun protection.    Seborrheic Keratosis  What causes seborrheic keratoses? Seborrheic keratoses are harmless, common skin growths that first appear during adult life.  As time goes by, more growths appear.  Some people may develop a large number of them.  Seborrheic keratoses appear on both covered and uncovered body parts.  They are not caused by sunlight.  The tendency to develop seborrheic keratoses can be inherited.  They vary in color from skin-colored to gray, Stene, or even black.  They can be either smooth or have a rough, warty surface.   Seborrheic keratoses are superficial and look as if they were stuck on the skin.  Under the microscope this type of keratosis looks like layers upon layers of  skin.  That is why at times the top layer may seem to fall off, but the rest of the growth remains and re-grows.    Treatment Seborrheic keratoses do not need to be treated, but can easily be removed in the office.  Seborrheic keratoses often cause symptoms when they rub on clothing or jewelry.  Lesions can be in the way of shaving.  If they become inflamed, they can cause itching, soreness, or burning.  Removal of a seborrheic keratosis can be accomplished by freezing, burning, or surgery. If any spot bleeds, scabs, or grows rapidly, please return to have it checked, as these can be an indication of a skin cancer.   Due to recent changes in healthcare laws, you may see results of your pathology and/or laboratory studies on MyChart before the doctors have had a chance to review them. We understand that in some cases there may be results that are confusing or concerning to you. Please understand that not all results are received at the same time and often the doctors may need to interpret multiple results in order to provide you with the best plan of care or course of treatment. Therefore, we ask that you please give [place] 2 business days to thoroughly review all your results before contacting the office for clarification. Should we see a critical lab result, you will be contacted sooner.   If You Need Anything After Your Visit  If you have any questions or concerns for your doctor, please call our main line at [phone] and press option 4 to reach your  doctor's Engineer, site. If no one answers, please leave a voicemail as directed and we will return your call as soon as possible. Messages left after 4 pm will be answered the following business day.   You may also send [place] a message via MyChart. We typically respond to MyChart messages within 1-2 business days.  For prescription refills, please ask your pharmacy to contact our office. Our fax number is [phone].  If you have an urgent issue  when the clinic is closed that cannot wait until the next business day, you can page your doctor at the number below.    Please note that while we do our best to be available for urgent issues outside of office hours, we are not available 24/7.   If you have an urgent issue and are unable to reach [place], you may choose to seek medical care at your doctor's office, retail clinic, urgent care center, or emergency room.  If you have a medical emergency, please immediately call 911 or go to the emergency department.  Pager Numbers  - Dr. Gwen Pounds: [phone]  - Dr. Neale Burly: [phone]  - Dr. Roseanne Reno: [phone]  In the event of inclement weather, please call our main line at [phone] for an update on the status of any delays or closures.  Dermatology Medication Tips: Please keep the boxes that topical medications come in in order to help keep track of the instructions about where and how to use these. Pharmacies typically print the medication instructions only on the boxes and not directly on the medication tubes.   If your medication is too expensive, please contact our office at [phone] option 4 or send [place] a message through MyChart.   We are unable to tell what your co-pay for medications will be in advance as this is different depending on your insurance coverage. However, we may be able to find a substitute medication at lower cost or fill out paperwork to get insurance to cover a needed medication.   If a prior authorization is required to get your medication covered by your insurance company, please allow [place] 1-2 business days to complete this process.  Drug prices often vary depending on where the prescription is filled and some pharmacies may offer cheaper prices.  The website www.goodrx.com contains coupons for medications through different pharmacies. The prices here do not account for what the cost may be with help from insurance (it may be cheaper with your insurance),  but the website can give you the price if you did not use any insurance.  - You can print the associated coupon and take it with your prescription to the pharmacy.  - You may also stop by our office during regular business hours and pick up a GoodRx coupon card.  - If you need your prescription sent electronically to a different pharmacy, notify our office through [facility] or by phone at [phone] option 4.     Si Usted Necesita Algo Despus de Su Visita  Tambin puede enviarnos un mensaje a travs de Clinical cytogeneticist. Por lo general respondemos a los mensajes de MyChart en el transcurso de 1 a 2 das hbiles.  Para renovar recetas, por favor pida a su farmacia que se ponga en contacto con nuestra oficina. Annie Sable de fax es [place] [phone].  Si tiene un asunto urgente cuando la clnica est cerrada y que no puede esperar hasta el siguiente da hbil, puede llamar/localizar a su doctor(a) al nmero que aparece a continuacin.  Por favor, tenga en cuenta que aunque hacemos todo lo posible para estar disponibles para asuntos urgentes fuera del horario de [place], no estamos disponibles las 24 horas del da, los 7 [address]  [address] de la [place].   Si tiene un problema urgente y no puede comunicarse con nosotros, puede optar por buscar atencin mdica  en el consultorio de su doctor(a), en una clnica privada, en un centro de atencin urgente o en una sala de emergencias.  Si tiene Engineer, drilling, por favor llame inmediatamente al 911 o vaya a la sala de emergencias.  Nmeros de bper  - Dr. Gwen Pounds: [phone]  - Dra. Moye: [phone]  - Dra. Roseanne Reno: [phone]  En caso de inclemencias del [place], por favor llame a Lacy Duverney principal al [phone] para una actualizacin sobre el [place] de cualquier retraso o cierre.  Consejos para la medicacin en dermatologa: Por favor, guarde las cajas en las que vienen los medicamentos de uso tpico para ayudarle a seguir las  instrucciones sobre dnde y cmo usarlos. Las farmacias generalmente imprimen las instrucciones del medicamento slo en las cajas y no directamente en los tubos del [place].   Si su medicamento es muy caro, por favor, pngase en contacto con Rolm Gala llamando al [phone] y presione la opcin 4 o envenos un mensaje a travs de Clinical cytogeneticist.   No podemos decirle cul ser su copago por los medicamentos por adelantado ya que esto es diferente dependiendo de la cobertura de su seguro. Sin embargo, es posible que podamos encontrar un medicamento sustituto a Audiological scientist un formulario para que el seguro cubra el medicamento que se considera necesario.   Si se requiere una autorizacin previa para que su compaa de seguros [place] su medicamento, por favor permtanos de 1 a 2 das hbiles para completar [address]  Los precios de los medicamentos varan con frecuencia dependiendo del Environmental consultant de dnde se surte la receta y alguna farmacias pueden ofrecer precios ms baratos.  El sitio web www.goodrx.com tiene cupones para medicamentos de Health and safety inspector. Los precios aqu no tienen en cuenta lo que podra costar con la ayuda del seguro (puede ser ms barato con su seguro), pero el sitio web puede darle el precio si no utiliz Tourist information centre manager.  - Puede imprimir el cupn correspondiente y llevarlo con su receta a la farmacia.  - Tambin puede pasar por nuestra oficina durante el horario de atencin regular y Education officer, museum una tarjeta de cupones de GoodRx.  - Si necesita que su receta se enve electrnicamente a una farmacia diferente, informe a nuestra oficina a travs de MyChart de [place] o por telfono llamando al [phone] y presione la opcin 4.

## 2021-10-21 NOTE — Progress Notes (Signed)
   New Patient Visit  Subjective  Nathan Rojas is a 46 y.o. male who presents for the following: lesions (Face. Patient c/o Brownlee spots on face. Raised, itching. Hits when shaving). The patient has spots, moles and lesions to be evaluated, some may be new or changing and the patient has concerns that these could be cancer.  Review of Systems: No other skin or systemic complaints except as noted in HPI or Assessment and Plan.  Objective  Well appearing patient in no apparent distress; mood and affect are within normal limits.  A focused examination was performed including head, including the scalp, face, neck, nose, ears, eyelids, and lips. Relevant physical exam findings are noted in the Assessment and Plan.  right prearuricular x1, right temple x1, right preauricular/sideburn x2 (4) Erythematous keratotic or waxy stuck-on papule or plaque.  forehead, temples Smooth white papule(s).    Assessment & Plan  Inflamed seborrheic keratosis (4) right prearuricular x1, right temple x1, right preauricular/sideburn x2 Symptomatic, irritating, patient would like treated. Destruction of lesion - right prearuricular x1, right temple x1, right preauricular/sideburn x2 Complexity: simple   Destruction method: cryotherapy   Informed consent: discussed and consent obtained   Timeout:  patient name, date of birth, surgical site, and procedure verified Lesion destroyed using liquid nitrogen: Yes   Region frozen until ice ball extended beyond lesion: Yes   Outcome: patient tolerated procedure well with no complications   Post-procedure details: wound care instructions given   Additional details:  Prior to procedure, discussed risks of blister formation, small wound, skin dyspigmentation, or rare scar following cryotherapy. Recommend Vaseline ointment to treated areas while healing.  [place] forehead, temples Recommend using OTC Differin 0.1% gel. Apply to face at bedtime, wash off in morning.    Actinic Damage - chronic, secondary to cumulative UV radiation exposure/sun exposure over time - diffuse scaly erythematous macules with underlying dyspigmentation - Recommend daily broad spectrum sunscreen SPF 30+ to sun-exposed areas, reapply every 2 hours as needed.  - Recommend staying in the shade or wearing long sleeves, sun glasses (UVA+UVB protection) and wide brim hats (4-inch brim around the entire circumference of the hat). - Call for new or changing lesions.  Return if symptoms worsen or fail to improve.  I, Lawson Radar, CMA, am acting as scribe for Armida Sans, MD. Documentation: I have reviewed the above documentation for accuracy and completeness, and I agree with the above.  Armida Sans, MD

## 2021-10-22 DIAGNOSIS — E669 Obesity, unspecified: Secondary | ICD-10-CM | POA: Diagnosis not present

## 2021-10-22 DIAGNOSIS — Z794 Long term (current) use of insulin: Secondary | ICD-10-CM | POA: Diagnosis not present

## 2021-10-22 DIAGNOSIS — E1165 Type 2 diabetes mellitus with hyperglycemia: Secondary | ICD-10-CM | POA: Diagnosis not present

## 2021-10-22 DIAGNOSIS — E1159 Type 2 diabetes mellitus with other circulatory complications: Secondary | ICD-10-CM | POA: Diagnosis not present

## 2021-10-22 DIAGNOSIS — E1169 Type 2 diabetes mellitus with other specified complication: Secondary | ICD-10-CM | POA: Diagnosis not present

## 2021-10-25 ENCOUNTER — Encounter: Payer: Self-pay | Admitting: Dermatology

## 2021-10-27 DIAGNOSIS — M7541 Impingement syndrome of right shoulder: Secondary | ICD-10-CM | POA: Diagnosis not present

## 2021-10-27 DIAGNOSIS — M7521 Bicipital tendinitis, right shoulder: Secondary | ICD-10-CM | POA: Diagnosis not present

## 2021-10-27 DIAGNOSIS — M25512 Pain in left shoulder: Secondary | ICD-10-CM | POA: Diagnosis not present

## 2021-10-27 DIAGNOSIS — M25511 Pain in right shoulder: Secondary | ICD-10-CM | POA: Diagnosis not present

## 2021-10-27 DIAGNOSIS — M7522 Bicipital tendinitis, left shoulder: Secondary | ICD-10-CM | POA: Diagnosis not present

## 2021-10-27 DIAGNOSIS — M7542 Impingement syndrome of left shoulder: Secondary | ICD-10-CM | POA: Diagnosis not present

## 2021-11-11 LAB — HM DIABETES EYE EXAM

## 2021-12-08 ENCOUNTER — Other Ambulatory Visit: Payer: Self-pay | Admitting: Family Medicine

## 2021-12-08 DIAGNOSIS — I10 Essential (primary) hypertension: Secondary | ICD-10-CM

## 2021-12-08 NOTE — Telephone Encounter (Signed)
Pt needs f/u appt

## 2021-12-09 DIAGNOSIS — Z1329 Encounter for screening for other suspected endocrine disorder: Secondary | ICD-10-CM | POA: Diagnosis not present

## 2021-12-09 DIAGNOSIS — E291 Testicular hypofunction: Secondary | ICD-10-CM | POA: Diagnosis not present

## 2021-12-09 DIAGNOSIS — Z79890 Hormone replacement therapy (postmenopausal): Secondary | ICD-10-CM | POA: Diagnosis not present

## 2021-12-11 ENCOUNTER — Other Ambulatory Visit: Payer: Self-pay | Admitting: Nurse Practitioner

## 2021-12-11 DIAGNOSIS — E291 Testicular hypofunction: Secondary | ICD-10-CM | POA: Diagnosis not present

## 2021-12-11 DIAGNOSIS — R6882 Decreased libido: Secondary | ICD-10-CM | POA: Diagnosis not present

## 2021-12-11 DIAGNOSIS — E1165 Type 2 diabetes mellitus with hyperglycemia: Secondary | ICD-10-CM

## 2021-12-11 DIAGNOSIS — M2550 Pain in unspecified joint: Secondary | ICD-10-CM | POA: Diagnosis not present

## 2021-12-11 DIAGNOSIS — Z6833 Body mass index (BMI) 33.0-33.9, adult: Secondary | ICD-10-CM | POA: Diagnosis not present

## 2021-12-11 NOTE — Telephone Encounter (Signed)
Called patient to schedule future appt for medication refills. No answer, LVMTCB

## 2021-12-11 NOTE — Telephone Encounter (Signed)
Courtesy refill. Called patient to schedule future appt for medication refills. No answer, LVMTCB. Requested Prescriptions  Pending Prescriptions Disp Refills  . OZEMPIC, 1 MG/DOSE, 4 MG/3ML SOPN [Pharmacy Med Name: OZEMPIC 1 MG/DOSE (4 MG/3 ML)] 3 mL 0    Sig: DIAL AND INJECT UNDER THE SKIN 1 MG WEEKLY     Endocrinology:  Diabetes - GLP-1 Receptor Agonists - semaglutide Failed - 12/11/2021  6:22 AM      Failed - HBA1C in normal range and within 180 days    Hemoglobin A1C  Date Value Ref Range Status  07/24/2020 7.0  Final   Hgb A1c MFr Bld  Date Value Ref Range Status  05/08/2021 9.8 (H) <5.7 % of total Hgb Final    Comment:    For someone without known diabetes, a hemoglobin A1c value of 6.5% or greater indicates that they may have  diabetes and this should be confirmed with a follow-up  test. . For someone with known diabetes, a value <7% indicates  that their diabetes is well controlled and a value  greater than or equal to 7% indicates suboptimal  control. A1c targets should be individualized based on  duration of diabetes, age, comorbid conditions, and  other considerations. . Currently, no consensus exists regarding use of hemoglobin A1c for diagnosis of diabetes for children. .          Failed - Valid encounter within last 6 months    Recent Outpatient Visits          7 months ago Type 2 diabetes mellitus with hyperglycemia, with long-term current use of insulin [facility])   [facility] Della Goo F, FNP   1 year ago Hypertension associated with type 2 diabetes mellitus [facility])   [facility] Danelle Berry, PA-C   1 year ago Mixed hyperlipidemia   [facility] Danelle Berry, PA-C   1 year ago Mixed hyperlipidemia   [facility] Danelle Berry, PA-C   3 years ago Mixed hyperlipidemia   [facility] Danelle Berry, [place]      Future Appointments            In 9 months McGowan,  Elana Al[facility]           Passed - Cr in normal range and within 360 days    Creat  Date Value Ref Range Status  10/31/2020 0.85 0.60 - 1.29 mg/dL Final   Creatinine, Ser  Date Value Ref Range Status  04/06/2021 0.98 0.76 - 1.27 mg/dL Final   Creatinine, Urine  Date Value Ref Range Status  01/04/2018 128 20 - 320 mg/dL Final

## 2021-12-31 ENCOUNTER — Encounter: Payer: Self-pay | Admitting: Family Medicine

## 2021-12-31 ENCOUNTER — Other Ambulatory Visit: Payer: Self-pay | Admitting: Family Medicine

## 2021-12-31 ENCOUNTER — Ambulatory Visit (INDEPENDENT_AMBULATORY_CARE_PROVIDER_SITE_OTHER): Payer: BC Managed Care – PPO | Admitting: Family Medicine

## 2021-12-31 VITALS — BP 116/74 | HR 79 | Temp 98.1°F | Resp 16 | Ht 73.5 in | Wt 254.9 lb

## 2021-12-31 DIAGNOSIS — Z794 Long term (current) use of insulin: Secondary | ICD-10-CM

## 2021-12-31 DIAGNOSIS — E1159 Type 2 diabetes mellitus with other circulatory complications: Secondary | ICD-10-CM | POA: Diagnosis not present

## 2021-12-31 DIAGNOSIS — Z114 Encounter for screening for human immunodeficiency virus [HIV]: Secondary | ICD-10-CM

## 2021-12-31 DIAGNOSIS — D582 Other hemoglobinopathies: Secondary | ICD-10-CM

## 2021-12-31 DIAGNOSIS — I152 Hypertension secondary to endocrine disorders: Secondary | ICD-10-CM

## 2021-12-31 DIAGNOSIS — Z23 Encounter for immunization: Secondary | ICD-10-CM

## 2021-12-31 DIAGNOSIS — E1165 Type 2 diabetes mellitus with hyperglycemia: Secondary | ICD-10-CM | POA: Diagnosis not present

## 2021-12-31 DIAGNOSIS — E1169 Type 2 diabetes mellitus with other specified complication: Secondary | ICD-10-CM

## 2021-12-31 DIAGNOSIS — N529 Male erectile dysfunction, unspecified: Secondary | ICD-10-CM

## 2021-12-31 DIAGNOSIS — I700 Atherosclerosis of aorta: Secondary | ICD-10-CM

## 2021-12-31 DIAGNOSIS — E785 Hyperlipidemia, unspecified: Secondary | ICD-10-CM

## 2021-12-31 MED ORDER — ATORVASTATIN CALCIUM 40 MG PO TABS: 40.0000 mg | ORAL_TABLET | Freq: Every day | ORAL | 3 refills | Status: DC

## 2021-12-31 MED ORDER — EMPAGLIFLOZIN 25 MG PO TABS: 25.0000 mg | ORAL_TABLET | Freq: Every day | ORAL | 3 refills | Status: DC

## 2021-12-31 NOTE — Assessment & Plan Note (Signed)
Hypertension:  Currently managed on lisinopril Pt reports good med compliance and denies any SE.   Blood pressure today is well controlled. BP Readings from Last 3 Encounters:  12/31/21 116/74  09/22/21 133/88  06/18/21 115/79   Pt denies CP, SOB, exertional sx, LE edema, palpitation, Ha's, visual disturbances, lightheadedness, hypotension, syncope. Dietary efforts for BP?  Working on [employer], decreased food portions with ozempic and lost some weight

## 2021-12-31 NOTE — Progress Notes (Signed)
Name: TYCEN STAIB   MRN: [id number]    DOB: 12-22-1975   Date:12/31/2021       Progress Note  Chief Complaint  Patient presents with   Follow-up   Hypertension   Hyperlipidemia   Diabetes     Subjective:   Nathan Rojas is a 46 y.o. male, presents to clinic for routine follow up  He is going to [facility] in [place] for management of erectile dysfunction and low testosterone he states he had testosterone pellets implanted he has labs from [facility] on his phone it shows testosterone and hemoglobin and hematocrit elevated over the past couple months Testosterone and H/H high   LDL high on lipitor 20, his dose was increased to 40 mg but he has run out of pills Good statin compliance and he has tolerated this in the past Lab Results  Component Value Date   CHOL 160 04/06/2021   HDL 26 (L) 04/06/2021   LDLCALC 105 (H) 04/06/2021   TRIG 163 (H) 04/06/2021   CHOLHDL 6.2 (H) 04/06/2021  Due for recheck  DM well controlled - per [facility] endocrinology Managed with [place] 60 units at bedtime, Ozempic, he is rarely needing short acting insulin, he is on lisinopril and statin Diabetic foot exam done today, per care everywhere he did have all appropriate kidney and urine microalbumin screening done     Current Outpatient Medications:    aspirin EC 81 MG tablet, Take 81 mg by mouth daily., Disp: , Rfl:    atorvastatin (LIPITOR) 20 MG tablet, Take 2 tablets (40 mg) by mouth once daily, Disp: , Rfl:    cholecalciferol (VITAMIN D) 1000 units tablet, Take 1 tablet (1,000 Units total) by mouth daily., Disp: , Rfl:    empagliflozin (JARDIANCE) 25 MG TABS tablet, Take 1 tablet (25 mg total) by mouth daily., Disp: 90 tablet, Rfl: 1   lisinopril (ZESTRIL) 10 MG tablet, TAKE 1 TABLET DAILY, Disp: 90 tablet, Rfl: 3   Multiple Vitamin (MULTIVITAMIN WITH MINERALS) TABS tablet, Take 1 tablet by mouth daily., Disp: , Rfl:    OZEMPIC, 1 MG/DOSE, 4 MG/3ML SOPN, DIAL AND INJECT UNDER  THE SKIN 1 MG WEEKLY, Disp: 3 mL, Rfl: 0   tadalafil (CIALIS) 20 MG tablet, Take 1 tablet (20 mg total) by mouth daily as needed for erectile dysfunction., Disp: 30 tablet, Rfl: 3   tadalafil (CIALIS) 5 MG tablet, Take 1 tablet (5 mg total) by mouth daily as needed for erectile dysfunction., Disp: 90 tablet, Rfl: 3   TRESIBA FLEXTOUCH 200 UNIT/ML FlexTouch Pen, SMARTSIG:60 Unit(s) SUB-Q Every Night, Disp: , Rfl:    anastrozole (ARIMIDEX) 1 MG tablet, Take 1 mg by mouth once a week. (Patient not taking: Reported on 12/31/2021), Disp: , Rfl:    fluticasone (FLONASE) 50 MCG/ACT nasal spray, Place 2 sprays into both nostrils daily. (Patient not taking: Reported on 12/31/2021), Disp: 16 g, Rfl: 2   Insulin NPH, Human,, Isophane, (HUMULIN N) 100 UNIT/ML Kiwkpen, Inject into the skin. (Patient not taking: Reported on 12/31/2021), Disp: , Rfl:   Patient Active Problem List   Diagnosis Date Noted   Pulmonary nodules 01/29/2021   DOE (dyspnea on exertion) 01/01/2021   Precordial pain - atypical angina [id number]   Metabolic syndrome [id number]   Hypertension associated with type 2 diabetes mellitus ([place]) 05/01/2018   Type 2 diabetes mellitus with hyperglycemia, with long-term current use of insulin ([place]) 05/01/2018   Preventative health care 01/10/2018   Erectile dysfunction 04/27/2017  Encounter for long-term (current) use of high-risk medication 03/05/2016   Rheumatoid arthritis, 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[place]) 01/23/2016   Obesity 04/18/2015   Abdominal aortic atherosclerosis ([place]) 11/16/2014   Hyperlipidemia associated with type 2 diabetes mellitus ([place])    Coronary artery disease, non-occlusive 01/09/2012    Past Surgical History:  Procedure Laterality Date   APPENDECTOMY     CHOLECYSTECTOMY N/A 05/06/2017   Procedure: LAPAROSCOPIC CHOLECYSTECTOMY;  Surgeon: Vickie Epley, MD;  Location: [facility] ORS;  Service: General;  Laterality: N/A;   COLONOSCOPY WITH PROPOFOL N/A 06/13/2020   Procedure: COLONOSCOPY  WITH PROPOFOL;  Surgeon: Virgel Manifold, MD;  Location: [facility] ENDOSCOPY;  Service: Endoscopy;  Laterality: N/A;   TONSILLECTOMY     TONSILLECTOMY     VASECTOMY  Jan 2015    Family History  Problem Relation Age of Onset   Diabetes type II Mother        resolved after GOP   Diabetes type I Sister        type 1   Dementia Maternal Grandmother    AAA (abdominal aortic aneurysm) Maternal Grandfather    Hypertension Maternal Grandfather     Social History   Tobacco Use   Smoking status: Former    Packs/day: 1.00    Years: 25.00    Total pack years: 25.00    Types: Cigarettes    Start date: 03/29/1989    Quit date: 02/08/2015    Years since quitting: 6.8   Smokeless tobacco: Never  Vaping Use   Vaping Use: Never used  Substance Use Topics   Alcohol use: Not Currently   Drug use: No     No Known Allergies  Health Maintenance  Topic Date Due   FOOT EXAM  04/29/2021   Diabetic kidney evaluation - GFR measurement  04/06/2022   HEMOGLOBIN A1C  04/17/2022   Diabetic kidney evaluation - Urine ACR  10/16/2022   OPHTHALMOLOGY EXAM  11/12/2022   COLONOSCOPY (Pts 45-18yrs Insurance coverage will need to be confirmed)  06/14/2030   Hepatitis C Screening  Completed   HIV Screening  Completed   HPV VACCINES  Aged Out   INFLUENZA VACCINE  Discontinued   TETANUS/TDAP  Discontinued   COVID-19 Vaccine  Discontinued    Chart Review Today: I personally reviewed active problem list, medication list, allergies, family history, social history, health maintenance, notes from last encounter, lab results, imaging with the patient/caregiver today.   Review of Systems  Constitutional: Negative.   HENT: Negative.    Eyes: Negative.   Respiratory: Negative.    Cardiovascular: Negative.   Gastrointestinal: Negative.   Endocrine: Negative.   Genitourinary: Negative.   Musculoskeletal: Negative.   Skin: Negative.   Allergic/Immunologic: Negative.   Neurological: Negative.    Hematological: Negative.   Psychiatric/Behavioral: Negative.    All other systems reviewed and are negative.    Objective:   Vitals:   12/31/21 1001  BP: 116/74  Pulse: 79  Resp: 16  Temp: 98.1 F (36.7 C)  TempSrc: Oral  SpO2: 97%  Weight: 254 lb 14.4 oz (115.6 kg)  Height: 6' 1.5" (1.867 m)    Body mass index is 33.17 kg/m.  Physical Exam Vitals and nursing note reviewed.  Constitutional:      General: He is not in acute distress.    Appearance: Normal appearance. He is well-developed and well-groomed. He is obese. He is not ill-appearing, toxic-appearing or diaphoretic.  HENT:     Head: Normocephalic and atraumatic.     Nose:  Nose normal.  Eyes:     General:        Right eye: No discharge.        Left eye: No discharge.     Conjunctiva/sclera: Conjunctivae normal.  Neck:     Trachea: No tracheal deviation.  Cardiovascular:     Rate and Rhythm: Normal rate and regular rhythm.     Pulses: Normal pulses.     Heart sounds: Normal heart sounds. No murmur heard.    No friction rub. No gallop.  Pulmonary:     Effort: Pulmonary effort is normal. No respiratory distress.     Breath sounds: Normal breath sounds. No stridor.  Abdominal:     General: Bowel sounds are normal.     Palpations: Abdomen is soft.  Musculoskeletal:        General: Normal range of motion.  Skin:    General: Skin is warm and dry.     Findings: No rash.  Neurological:     Mental Status: He is alert.     Motor: No abnormal muscle tone.     Coordination: Coordination normal.  Psychiatric:        Behavior: Behavior normal. Behavior is cooperative.         Assessment & Plan:   Problem List Items Addressed This Visit       Cardiovascular and Mediastinum   Hypertension associated with type 2 diabetes mellitus ([place]) (Chronic)    Hypertension:  Currently managed on lisinopril Pt reports good med compliance and denies any SE.   Blood pressure today is well controlled. BP Readings  from Last 3 Encounters:  12/31/21 116/74  09/22/21 133/88  06/18/21 115/79  Pt denies CP, SOB, exertional sx, LE edema, palpitation, Ha's, visual disturbances, lightheadedness, hypotension, syncope. Dietary efforts for BP?  Working on [employer], decreased food portions with ozempic and lost some weight         Relevant Medications   empagliflozin (JARDIANCE) 25 MG TABS tablet   atorvastatin (LIPITOR) 40 MG tablet   Other Relevant Orders   Comprehensive metabolic panel   Abdominal aortic atherosclerosis (HCC) (Chronic)   Relevant Medications   atorvastatin (LIPITOR) 40 MG tablet   Other Relevant Orders   Comprehensive metabolic panel   Lipid panel     Endocrine   Hyperlipidemia associated with type 2 diabetes mellitus (HCC) (Chronic)    Last lipids not at goal, lipitor was increased from 20 to 40 which he is tolerating Recheck labs Dose changed and refilled      Relevant Medications   empagliflozin (JARDIANCE) 25 MG TABS tablet   atorvastatin (LIPITOR) 40 MG tablet   Other Relevant Orders   Comprehensive metabolic panel   Lipid panel   Type 2 diabetes mellitus with hyperglycemia, with long-term current use of insulin (HCC) - Primary (Chronic)    DM:   Pt managing DM with 60 units tresiba ozempic  Reports good med compliance Pt has no SE from meds. Blood sugars 90  Denies: Polyuria, polydipsia, vision changes, neuropathy, hypoglycemia Recent pertinent labs: Lab Results  Component Value Date   HGBA1C 6.4 10/15/2021   HGBA1C 9.8 (H) 05/08/2021   HGBA1C 9.5 (H) 10/31/2020   Lab Results  Component Value Date   MICROALBUR 1.1 01/04/2018   LDLCALC 105 (H) 04/06/2021   CREATININE 0.98 04/06/2021   Standard of care and health maintenance: Urine Microalbumin:  Due annually Foot exam:  Done  DM eye exam:  Done my eye dr ACEI/ARB:  yes Statin:  yes        Relevant Medications   empagliflozin (JARDIANCE) 25 MG TABS tablet   atorvastatin (LIPITOR) 40 MG tablet    Other Relevant Orders   Comprehensive metabolic panel   Lipid panel   Microalbumin / creatinine urine ratio (Completed)   Protein / creatinine ratio, urine (Completed)     Other   Erectile dysfunction    Improved symptoms with treatment of low testosterone and daily Cialis, rarely needing to use higher dose prior to sexual activity      Other Visit Diagnoses     Screening for HIV without presence of risk factors       Relevant Orders   HIV Antibody (routine testing w rflx)   Need for influenza vaccination       Patient declined   Need for tetanus, diphtheria, and acellular pertussis (Tdap) vaccine       Patient declined   Elevated hemoglobin ([place])       Per [facility] labs on phone explained risk to patient, he was advised to donate blood, will recheck counts   Relevant Orders   CBC with Differential/Platelet        Return in about 6 months (around 07/02/2022) for Annual Physical.   Delsa Grana, PA-C 12/31/21 10:13 AM

## 2021-12-31 NOTE — Assessment & Plan Note (Signed)
DM:   Pt managing DM with 60 units tresiba ozempic  Reports good med compliance Pt has no SE from meds. Blood sugars 90  Denies: Polyuria, polydipsia, vision changes, neuropathy, hypoglycemia Recent pertinent labs: Lab Results  Component Value Date   HGBA1C 6.4 10/15/2021   HGBA1C 9.8 (H) 05/08/2021   HGBA1C 9.5 (H) 10/31/2020   Lab Results  Component Value Date   MICROALBUR 1.1 01/04/2018   LDLCALC 105 (H) 04/06/2021   CREATININE 0.98 04/06/2021   Standard of care and health maintenance: Urine Microalbumin:  Due annually Foot exam:  Done  DM eye exam:  Done my eye dr ACEI/ARB:  yes Statin:  yes

## 2022-01-01 MED ORDER — ATORVASTATIN CALCIUM 40 MG PO TABS: 40.0000 mg | ORAL_TABLET | Freq: Every day | ORAL | 3 refills | Status: DC

## 2022-01-01 NOTE — Assessment & Plan Note (Signed)
Improved symptoms with treatment of low testosterone and daily Cialis, rarely needing to use higher dose prior to sexual activity

## 2022-01-01 NOTE — Assessment & Plan Note (Signed)
Last lipids not at goal, lipitor was increased from 20 to 40 which he is tolerating Recheck labs Dose changed and refilled

## 2022-01-08 ENCOUNTER — Other Ambulatory Visit: Payer: Self-pay | Admitting: Nurse Practitioner

## 2022-01-08 DIAGNOSIS — Z6832 Body mass index (BMI) 32.0-32.9, adult: Secondary | ICD-10-CM | POA: Diagnosis not present

## 2022-01-08 DIAGNOSIS — E1165 Type 2 diabetes mellitus with hyperglycemia: Secondary | ICD-10-CM

## 2022-01-08 DIAGNOSIS — E291 Testicular hypofunction: Secondary | ICD-10-CM | POA: Diagnosis not present

## 2022-01-08 NOTE — Telephone Encounter (Signed)
Requested Prescriptions  Pending Prescriptions Disp Refills  . OZEMPIC, 1 MG/DOSE, 4 MG/3ML SOPN [Pharmacy Med Name: OZEMPIC 1 MG/DOSE (4 MG/3 ML)] 3 mL 0    Sig: DIAL AND INJECT UNDER THE SKIN 1 MG WEEKLY     Endocrinology:  Diabetes - GLP-1 Receptor Agonists - semaglutide Passed - 01/08/2022  6:22 AM      Passed - HBA1C in normal range and within 180 days    Hemoglobin A1C  Date Value Ref Range Status  10/15/2021 6.4  Final    Comment:    Nathan Rojas         Passed - Cr in normal range and within 360 days    Creat  Date Value Ref Range Status  10/31/2020 0.85 0.60 - 1.29 mg/dL Final   Creatinine, Ser  Date Value Ref Range Status  04/06/2021 0.98 0.76 - 1.27 mg/dL Final   Creatinine, Urine  Date Value Ref Range Status  10/15/2021 84.6  Final         Passed - Valid encounter within last 6 months    Recent Outpatient Visits          1 week ago Type 2 diabetes mellitus with hyperglycemia, with long-term current use of insulin [facility])   [facility] Delsa Grana, PA-C   8 months ago Type 2 diabetes mellitus with hyperglycemia, with long-term current use of insulin [facility])   [facility] Serafina Royals F, FNP   1 year ago Hypertension associated with type 2 diabetes mellitus [facility])   [facility] Delsa Grana, PA-C   1 year ago Mixed hyperlipidemia   [facility] Delsa Grana, PA-C   1 year ago Mixed hyperlipidemia   [facility] Delsa Grana, PA-C      Future Appointments            In 5 months Delsa Grana, PA-C [facility], [place]   In 8 months McGowan, Shannon A, [place]

## 2022-01-12 ENCOUNTER — Telehealth: Payer: Self-pay | Admitting: Family Medicine

## 2022-01-12 DIAGNOSIS — E1165 Type 2 diabetes mellitus with hyperglycemia: Secondary | ICD-10-CM

## 2022-01-12 MED ORDER — OZEMPIC (1 MG/DOSE) 4 MG/3ML ~~LOC~~ SOPN: PEN_INJECTOR | SUBCUTANEOUS | 2 refills | Status: DC

## 2022-01-12 NOTE — Telephone Encounter (Signed)
Requested Prescriptions  Pending Prescriptions Disp Refills  . Semaglutide, 1 MG/DOSE, (OZEMPIC, 1 MG/DOSE,) 4 MG/3ML SOPN 3 mL 2    Sig: DIAL AND INJECT UNDER THE SKIN 1 MG WEEKLY     Endocrinology:  Diabetes - GLP-1 Receptor Agonists - semaglutide Passed - 01/12/2022 11:09 AM      Passed - HBA1C in normal range and within 180 days    Hemoglobin A1C  Date Value Ref Range Status  10/15/2021 6.4  Final    Comment:    [employer]         Passed - Cr in normal range and within 360 days    Creat  Date Value Ref Range Status  10/31/2020 0.85 0.60 - 1.29 mg/dL Final   Creatinine, Ser  Date Value Ref Range Status  04/06/2021 0.98 0.76 - 1.27 mg/dL Final   Creatinine, Urine  Date Value Ref Range Status  10/15/2021 84.6  Final         Passed - Valid encounter within last 6 months    Recent Outpatient Visits          1 week ago Type 2 diabetes mellitus with hyperglycemia, with long-term current use of insulin [facility])   [facility] Delsa Grana, PA-C   8 months ago Type 2 diabetes mellitus with hyperglycemia, with long-term current use of insulin [facility])   [facility] Serafina Royals F, FNP   1 year ago Hypertension associated with type 2 diabetes mellitus [facility])   [facility] Delsa Grana, PA-C   1 year ago Mixed hyperlipidemia   [facility] Delsa Grana, PA-C   1 year ago Mixed hyperlipidemia   [facility] Delsa Grana, PA-C      Future Appointments            In 5 months Delsa Grana, PA-C [facility], [place]   In 8 months McGowan, Shannon A, [place]

## 2022-01-12 NOTE — Telephone Encounter (Signed)
Copied from [place] [id number]. Topic: General - Other >> Jan 12, 2022  9:15 AM Everette C wrote: Reason for CRM: Medication Refill - Medication: OZEMPIC, 1 MG/DOSE, 4 MG/3ML SOPN [[id number]]   Has the patient contacted their pharmacy? Yes.  The patient has been directed to contact their PCP and request a 3 month supply (Agent: If no, request that the patient contact the pharmacy for the refill. If patient does not wish to contact the pharmacy document the reason why and proceed with request.) (Agent: If yes, when and what did the pharmacy advise?)  Preferred Pharmacy (with phone number or street name): [facility] [id number], [place] [address] Phone: [phone] Fax: [phone] Hours: Not open 24 hours   Has the patient been seen for an appointment in the last year OR does the patient have an upcoming appointment? Yes.    Agent: Please be advised that RX refills may take up to 3 business days. We ask that you follow-up with your pharmacy.

## 2022-01-19 ENCOUNTER — Telehealth: Payer: Self-pay | Admitting: Family Medicine

## 2022-01-19 DIAGNOSIS — E1165 Type 2 diabetes mellitus with hyperglycemia: Secondary | ICD-10-CM

## 2022-01-19 MED ORDER — OZEMPIC (1 MG/DOSE) 4 MG/3ML ~~LOC~~ SOPN: 1.0000 mg | PEN_INJECTOR | SUBCUTANEOUS | 1 refills | Status: DC

## 2022-01-19 NOTE — Telephone Encounter (Signed)
Per DPR ok to leave detailed vm.

## 2022-01-19 NOTE — Telephone Encounter (Signed)
Med dose changed by other provider in clinic Apparently not covered by insurance w/o 90 d supply Per endo note pt tolerating higher dose just time and advised to continue per PCP Will adjust the Rx for ozempic for 3 months supply and send to pharmacy in note Pt can reach out if there are still issues with rx coverage.  1. Type 2 diabetes mellitus with hyperglycemia, with long-term current use of insulin (HCC) - Semaglutide, 1 MG/DOSE, (OZEMPIC, 1 MG/DOSE,) 4 MG/3ML SOPN; Inject 1 mg into the skin once a week.  Dispense: 9 mL; Refill: 1   Delsa Grana, PA-C

## 2022-01-19 NOTE — Telephone Encounter (Signed)
The patient states he contacted his pharmacy and the prescription for Semaglutide, 1 MG/DOSE, (OZEMPIC, 1 MG/DOSE,) 4 MG/3ML SOPN wasn't written right so that the insurance will cover it. He states the pharmacy told him it was called in with refills but he needed it to be sent in as a 3 month supply. He uses  [id number] - [address], [place] Phone:  [phone]  Fax:  [phone]     Please assist patient further

## 2022-01-20 ENCOUNTER — Other Ambulatory Visit: Payer: Self-pay

## 2022-01-20 DIAGNOSIS — E1165 Type 2 diabetes mellitus with hyperglycemia: Secondary | ICD-10-CM

## 2022-01-20 MED ORDER — OZEMPIC (1 MG/DOSE) 4 MG/3ML ~~LOC~~ SOPN: 1.0000 mg | PEN_INJECTOR | SUBCUTANEOUS | 0 refills | Status: DC

## 2022-01-20 NOTE — Telephone Encounter (Signed)
Pt requesting a 90 day supply per insurance to cover at better price.

## 2022-01-26 DIAGNOSIS — Z794 Long term (current) use of insulin: Secondary | ICD-10-CM | POA: Diagnosis not present

## 2022-01-26 DIAGNOSIS — E1159 Type 2 diabetes mellitus with other circulatory complications: Secondary | ICD-10-CM | POA: Diagnosis not present

## 2022-01-26 DIAGNOSIS — E1165 Type 2 diabetes mellitus with hyperglycemia: Secondary | ICD-10-CM | POA: Diagnosis not present

## 2022-01-26 DIAGNOSIS — E1169 Type 2 diabetes mellitus with other specified complication: Secondary | ICD-10-CM | POA: Diagnosis not present

## 2022-04-04 ENCOUNTER — Other Ambulatory Visit: Payer: Self-pay | Admitting: Family Medicine

## 2022-04-04 DIAGNOSIS — E1165 Type 2 diabetes mellitus with hyperglycemia: Secondary | ICD-10-CM

## 2022-06-10 LAB — HEMOGLOBIN A1C: Hemoglobin A1C: 7.1

## 2022-06-10 LAB — MICROALBUMIN / CREATININE URINE RATIO: Microalb Creat Ratio: 7.6

## 2022-06-25 ENCOUNTER — Ambulatory Visit
Admission: EM | Admit: 2022-06-25 | Discharge: 2022-06-25 | Disposition: A | Discharge status: Home / Self Care | Payer: BC Managed Care – PPO | Attending: Urgent Care | Admitting: Urgent Care

## 2022-06-25 DIAGNOSIS — R112 Nausea with vomiting, unspecified: Secondary | ICD-10-CM

## 2022-06-25 DIAGNOSIS — R42 Dizziness and giddiness: Secondary | ICD-10-CM | POA: Diagnosis not present

## 2022-06-25 MED ORDER — MECLIZINE HCL 12.5 MG PO TABS: 12.5000 mg | ORAL_TABLET | Freq: Three times a day (TID) | ORAL | 0 refills | Status: DC | PRN

## 2022-06-25 NOTE — ED Triage Notes (Signed)
Pt states he woke up Wednesday and had ear pressure with decreased hearing. Then started having dizziness Wednesday afternoon. Pt states he was vomiting and had nausea Thursday with the dizziness. This morning does feel better but still having some dizziness. Pt states he has been watching blood sugar levels and blood pressure and they have been good all week.

## 2022-06-25 NOTE — Discharge Instructions (Signed)
Follow up with your primary care provider if your symptoms are worsening or not improving.    

## 2022-06-25 NOTE — ED Provider Notes (Signed)
[facility]    CSN: [id number] Arrival date & time: 06/25/22  1116      History   Chief Complaint Chief Complaint  Patient presents with   Dizziness    HPI Nathan Rojas is a 47 y.o. male.    Dizziness   Awoke feeling his ears were "stopped up", endorses Muffled hearing. Denies popping. Reports dizziness (world spinning - "vibrating" when he got to work (3rd shift) - difficulty looking at his phone.  Vomiting when he returned home.   He reports well controlled BG throughout. Denies elevated BPs .  Denies vision changes.  Feels better today but continues dizziness. Ears no longer feel "full".  Denies any recent medication changes.  Past Medical History:  Diagnosis Date   Chronic pain of multiple joints 01/23/2016   CKD (chronic kidney disease)    Coronary artery disease, non-occlusive 01/09/2012   Coronary CT Angiogram: Coronary Calcium Score 92.  CAD RADS 2.  Mild-nonobstructive disease in the LAD (25-49%).  Minimal (<25%) disease in RCA and LCx.  Right dominant system normal origins.   CRP elevated 01/23/2016   Flexor tenosynovitis of thumb 05/27/2016   Hepatic steatosis 12/2020   noted on chest CT.   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[place]) 01/23/2016   Trigeminal neuralgia 06/03/2015   Type 2 diabetes mellitus, uncontrolled    Vitamin D deficiency 07/09/2016    Patient Active Problem List   Diagnosis Date Noted   Pulmonary nodules 01/29/2021   Hypertension associated with type 2 diabetes mellitus ([place]) 05/01/2018   Type 2 diabetes mellitus with hyperglycemia, with long-term current use of insulin ([place]) 05/01/2018   Erectile dysfunction 04/27/2017   Encounter for long-term (current) use of high-risk medication 03/05/2016   Rheumatoid arthritis, 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[place]) 01/23/2016   Obesity 04/18/2015   Abdominal aortic atherosclerosis  ([place]) 11/16/2014   Hyperlipidemia associated with type 2 diabetes mellitus ([place])    Coronary artery disease, non-occlusive 01/09/2012    Past Surgical History:  Procedure Laterality Date   APPENDECTOMY     CHOLECYSTECTOMY N/A 05/06/2017   Procedure: LAPAROSCOPIC CHOLECYSTECTOMY;  Surgeon: Vickie Epley, MD;  Location: [facility] ORS;  Service: General;  Laterality: N/A;   COLONOSCOPY WITH PROPOFOL N/A 06/13/2020   Procedure: COLONOSCOPY WITH PROPOFOL;  Surgeon: Virgel Manifold, MD;  Location: [facility] ENDOSCOPY;  Service: Endoscopy;  Laterality: N/A;   TONSILLECTOMY     TONSILLECTOMY     VASECTOMY  Jan 2015       Home Medications    Prior to Admission medications   Medication Sig Start Date End Date Taking? Authorizing Provider  aspirin EC 81 MG tablet Take 81 mg by mouth daily.   Yes [provider]  atorvastatin (LIPITOR) 40 MG tablet Take 1 tablet (40 mg total) by mouth daily. 01/01/22  Yes Delsa Grana, PA-C  cholecalciferol (VITAMIN D) 1000 units tablet Take 1 tablet (1,000 Units total) by mouth daily. 01/04/18  Yes Lada, Satira Anis, MD  empagliflozin (JARDIANCE) 25 MG TABS tablet Take 1 tablet (25 mg total) by mouth daily. 12/31/21  Yes Delsa Grana, PA-C  Insulin NPH, Human,, Isophane, (HUMULIN N) 100 UNIT/ML Kiwkpen Inject into the skin.   Yes [provider]  lisinopril (ZESTRIL) 10 MG tablet TAKE 1 TABLET DAILY 10/20/20  Yes Delsa Grana, PA-C  Multiple Vitamin (MULTIVITAMIN WITH MINERALS) TABS tablet Take 1 tablet  by mouth daily.   Yes [provider]  OZEMPIC, 1 MG/DOSE, 4 MG/3ML SOPN INJECT 1MG  SUBCUTANEOUSLY  ONCE WEEKLY (EVERY 7 DAYS) 04/05/22  Yes Teodora Medici, DO  tadalafil (CIALIS) 5 MG tablet Take 1 tablet (5 mg total) by mouth daily as needed for erectile dysfunction. 09/22/21  Yes McGowan, Larene [place] A, PA-C  TRESIBA FLEXTOUCH 200 UNIT/ML FlexTouch Pen SMARTSIG:60 Unit(s) SUB-Q Every Night 09/17/21  Yes [provider]  anastrozole  (ARIMIDEX) 1 MG tablet Take 1 mg by mouth once a week. 09/14/21   [provider]  fluticasone (FLONASE) 50 MCG/ACT nasal spray Place 2 sprays into both nostrils daily. Patient not taking: Reported on 12/31/2021 06/18/21   Carvel Getting, NP  tadalafil (CIALIS) 20 MG tablet Take 1 tablet (20 mg total) by mouth daily as needed for erectile dysfunction. 09/22/21   Nori Riis, PA-C    Family History Family History  Problem Relation Age of Onset   Diabetes type II Mother        resolved after GOP   Diabetes type I Sister        type 1   Dementia Maternal Grandmother    AAA (abdominal aortic aneurysm) Maternal Grandfather    Hypertension Maternal Grandfather     Social History Social History   Tobacco Use   Smoking status: Former    Packs/day: 1.00    Years: 25.00    Additional pack years: 0.00    Total pack years: 25.00    Types: Cigarettes    Start date: 03/29/1989    Quit date: 02/08/2015    Years since quitting: 7.3   Smokeless tobacco: Never  Vaping Use   Vaping Use: Never used  Substance Use Topics   Alcohol use: Not Currently   Drug use: No     Allergies   Patient has no known allergies.   Review of Systems Review of Systems  Neurological:  Positive for dizziness.     Physical Exam Triage Vital Signs ED Triage Vitals  Enc Vitals Group     BP 06/25/22 1221 108/83     Pulse Rate 06/25/22 1221 82     Resp 06/25/22 1221 18     Temp 06/25/22 1221 98.2 F (36.8 C)     Temp Source 06/25/22 1221 Oral     SpO2 06/25/22 1221 98 %     Weight --      Height --      Head Circumference --      Peak Flow --      Pain Score 06/25/22 1238 0     Pain Loc --      Pain Edu? --      Excl. in [place]? --    Orthostatic VS for the past 24 hrs:  BP- Lying Pulse- Lying BP- Sitting Pulse- Sitting BP- Standing at 0 minutes Pulse- Standing at 0 minutes  06/25/22 1248 132/86 72 (!) 125/94 83 138/87 83    Updated Vital Signs BP 108/83 (BP Location: Left Arm)    Pulse 82   Temp 98.2 F (36.8 C) (Oral)   Resp 18   SpO2 98%   Visual Acuity Right Eye Distance:   Left Eye Distance:   Bilateral Distance:    Right Eye Near:   Left Eye Near:    Bilateral Near:     Physical Exam Vitals reviewed.  Constitutional:      Appearance: Normal appearance. He is not ill-appearing.  HENT:  Ears:     Comments: TMs are non-erythematous bilaterally. R TM is somewhat cloudy appearing. Middle ear structures are visible. Skin:    General: Skin is warm and dry.  Neurological:     General: No focal deficit present.     Mental Status: He is alert and oriented to person, place, and time.  Psychiatric:        Mood and Affect: Mood normal.        Behavior: Behavior normal.      UC Treatments / Results  Labs (all labs ordered are listed, but only abnormal results are displayed) Labs Reviewed - No data to display  EKG   Radiology No results found.  Procedures Procedures (including critical care time)  Medications Ordered in UC Medications - No data to display  Initial Impression / Assessment and Plan / UC Course  I have reviewed the triage vital signs and the nursing notes.  Pertinent labs & imaging results that were available during my care of the patient were reviewed by me and considered in my medical decision making (see chart for details).   Vertigo like reaction with unknown etiology.  Will prescribe meclizine for symptom control.  Referring patient to his primary care for possible need of referral to ENT.   Final Clinical Impressions(s) / UC Diagnoses   Final diagnoses:  None   Discharge Instructions   None    ED Prescriptions   None    PDMP not reviewed this encounter.   Rose Phi, [place] 06/25/22 1316

## 2022-07-02 ENCOUNTER — Encounter: Payer: BC Managed Care – PPO | Admitting: Family Medicine

## 2022-07-08 NOTE — Patient Instructions (Signed)

## 2022-07-09 ENCOUNTER — Ambulatory Visit (INDEPENDENT_AMBULATORY_CARE_PROVIDER_SITE_OTHER): Payer: BC Managed Care – PPO | Admitting: Family Medicine

## 2022-07-09 ENCOUNTER — Encounter: Payer: Self-pay | Admitting: Family Medicine

## 2022-07-09 VITALS — BP 110/76 | HR 85 | Temp 97.8°F | Resp 16 | Ht 72.5 in | Wt 240.5 lb

## 2022-07-09 DIAGNOSIS — E1159 Type 2 diabetes mellitus with other circulatory complications: Secondary | ICD-10-CM | POA: Diagnosis not present

## 2022-07-09 DIAGNOSIS — Z6832 Body mass index (BMI) 32.0-32.9, adult: Secondary | ICD-10-CM | POA: Diagnosis not present

## 2022-07-09 DIAGNOSIS — E1165 Type 2 diabetes mellitus with hyperglycemia: Secondary | ICD-10-CM

## 2022-07-09 DIAGNOSIS — E1169 Type 2 diabetes mellitus with other specified complication: Secondary | ICD-10-CM | POA: Diagnosis not present

## 2022-07-09 DIAGNOSIS — Z794 Long term (current) use of insulin: Secondary | ICD-10-CM

## 2022-07-09 DIAGNOSIS — E6609 Other obesity due to excess calories: Secondary | ICD-10-CM | POA: Diagnosis not present

## 2022-07-09 DIAGNOSIS — Z1211 Encounter for screening for malignant neoplasm of colon: Secondary | ICD-10-CM | POA: Diagnosis not present

## 2022-07-09 DIAGNOSIS — R7989 Other specified abnormal findings of blood chemistry: Secondary | ICD-10-CM | POA: Diagnosis not present

## 2022-07-09 DIAGNOSIS — I152 Hypertension secondary to endocrine disorders: Secondary | ICD-10-CM

## 2022-07-09 DIAGNOSIS — Z0000 Encounter for general adult medical examination without abnormal findings: Secondary | ICD-10-CM

## 2022-07-09 DIAGNOSIS — Z23 Encounter for immunization: Secondary | ICD-10-CM

## 2022-07-09 DIAGNOSIS — N529 Male erectile dysfunction, unspecified: Secondary | ICD-10-CM

## 2022-07-09 DIAGNOSIS — E785 Hyperlipidemia, unspecified: Secondary | ICD-10-CM

## 2022-07-09 DIAGNOSIS — E66811 Obesity, class 1: Secondary | ICD-10-CM

## 2022-07-09 LAB — CBC WITH DIFFERENTIAL/PLATELET
Absolute Monocytes: 426 cells/uL (ref 200–950)
Basophils Absolute: 49 cells/uL (ref 0–200)
Basophils Relative: 0.6 %
Eosinophils Absolute: 90 cells/uL (ref 15–500)
Eosinophils Relative: 1.1 %
HCT: 51.2 % — ABNORMAL HIGH (ref 38.5–50.0)
Hemoglobin: 16.8 g/dL (ref 13.2–17.1)
Lymphs Abs: 1763 cells/uL (ref 850–3900)
MCH: 28.9 pg (ref 27.0–33.0)
MCHC: 32.8 g/dL (ref 32.0–36.0)
MCV: 88.1 fL (ref 80.0–100.0)
MPV: 10.4 fL (ref 7.5–12.5)
Monocytes Relative: 5.2 %
Neutro Abs: 5871 cells/uL (ref 1500–7800)
Neutrophils Relative %: 71.6 %
Platelets: 299 10*3/uL (ref 140–400)
RBC: 5.81 10*6/uL — ABNORMAL HIGH (ref 4.20–5.80)
RDW: 14.5 % (ref 11.0–15.0)
Total Lymphocyte: 21.5 %
WBC: 8.2 10*3/uL (ref 3.8–10.8)

## 2022-07-09 LAB — COMPLETE METABOLIC PANEL WITH GFR
AG Ratio: 2.1 (calc) (ref 1.0–2.5)
ALT: 36 U/L (ref 9–46)
AST: 45 U/L — ABNORMAL HIGH (ref 10–40)
Albumin: 4.6 g/dL (ref 3.6–5.1)
Alkaline phosphatase (APISO): 39 U/L (ref 36–130)
BUN: 23 mg/dL (ref 7–25)
CO2: 29 mmol/L (ref 20–32)
Calcium: 9.5 mg/dL (ref 8.6–10.3)
Chloride: 102 mmol/L (ref 98–110)
Creat: 0.92 mg/dL (ref 0.60–1.29)
Globulin: 2.2 g/dL (calc) (ref 1.9–3.7)
Glucose, Bld: 77 mg/dL (ref 65–99)
Potassium: 5 mmol/L (ref 3.5–5.3)
Sodium: 138 mmol/L (ref 135–146)
Total Bilirubin: 2.1 mg/dL — ABNORMAL HIGH (ref 0.2–1.2)
Total Protein: 6.8 g/dL (ref 6.1–8.1)
eGFR: 103 mL/min/{1.73_m2} (ref >=60)

## 2022-07-09 LAB — LIPID PANEL
Cholesterol: 99 mg/dL (ref <200)
HDL: 28 mg/dL — ABNORMAL LOW (ref >=40)
LDL Cholesterol (Calc): 54 mg/dL (calc)
Non-HDL Cholesterol (Calc): 71 mg/dL (calc) (ref <130)
Total CHOL/HDL Ratio: 3.5 (calc) (ref <5.0)
Triglycerides: 90 mg/dL (ref <150)

## 2022-07-09 NOTE — Assessment & Plan Note (Signed)
Per endocrinology, on basal insulin, jardiance, ozempic - dose just increased to 2 DM foot exam done, due for eye exam in Aug 2024, A1C and UACR reviewed through care everywhere labs

## 2022-07-09 NOTE — Assessment & Plan Note (Signed)
On statin, tolerating, checking labs today

## 2022-07-09 NOTE — Assessment & Plan Note (Signed)
Losing weight, working out, diet/nutrition changes

## 2022-07-09 NOTE — Assessment & Plan Note (Signed)
BP at goal today, well controlled and stable on lisinopril 10 mg daily

## 2022-07-09 NOTE — Assessment & Plan Note (Signed)
Per specialists - testosterone pellets and on arimidex

## 2022-07-09 NOTE — Assessment & Plan Note (Signed)
Per urology on cialis

## 2022-07-09 NOTE — Progress Notes (Signed)
Patient: Nathan Rojas, Male    DOB: 22-Jul-1975, 47 y.o.   MRN: [id number] Danelle Berry, PA-C Visit Date: 07/09/2022  Today's Provider: Danelle Berry, PA-C   Chief Complaint  Patient presents with   Annual Exam   Subjective:   Annual physical exam:  Nathan Rojas is a 47 y.o. male who presents today for health maintenance and annual & complete physical exam.   Exercise/Activity: gym Diet/nutrition: working on protein intake and health plan with gym trainer Sleep:  sleeps great  Seeing endocrinology [facility] Last A1C 7.1, prior to that was 6.5   SDOH Screenings   Food Insecurity: No Food Insecurity (07/09/2022)  Housing: Low Risk  (07/09/2022)  Transportation Needs: No Transportation Needs (07/09/2022)  Utilities: Not At Risk (07/09/2022)  Alcohol Screen: Low Risk  (07/09/2022)  Depression (PHQ2-9): Low Risk  (07/09/2022)  Financial Resource Strain: Low Risk  (07/09/2022)  Physical Activity: Sufficiently Active (07/09/2022)  Social Connections: Moderately Isolated (07/09/2022)  Stress: No Stress Concern Present (07/09/2022)  Tobacco Use: Medium Risk (07/09/2022)     USPSTF grade A and B recommendations - reviewed and addressed today  Depression:  Phq 9 completed today by patient, was reviewed by me with patient in the room, score is  negative, pt feels good    07/09/2022    9:32 AM 12/31/2021   10:02 AM 05/08/2021    9:32 AM  Depression screen PHQ 2/9  Decreased Interest 0 0 0  Down, Depressed, Hopeless 0 0 0  PHQ - 2 Score 0 0 0  Altered sleeping 0 0   Tired, decreased energy 0 0   Change in appetite 0 0   Feeling bad or failure about yourself  0 0   Trouble concentrating 0 0   Moving slowly or fidgety/restless 0 0   Suicidal thoughts 0 0   PHQ-9 Score 0 0   Difficult doing work/chores Not difficult at all Not difficult at all     Hep C Screening: done  STD testing and prevention (HIV/chl/gon/syphilis): done  Intimate partner violence:safe  Advanced  Care Planning:  A voluntary discussion about advance care planning including the explanation and discussion of advance directives.  Discussed health care proxy and Living will, and the patient was able to identify a health care proxy as wife .  Patient does not have a living will at present time. If patient does have living will, I have requested they bring this to the clinic to be scanned in to their chart.  Health Maintenance  Topic Date Due   Diabetic kidney evaluation - eGFR measurement  04/06/2022   Diabetic kidney evaluation - Urine ACR  10/16/2022   OPHTHALMOLOGY EXAM  11/12/2022   HEMOGLOBIN A1C  12/11/2022   FOOT EXAM  01/01/2023   COLONOSCOPY (Pts 45-18yrs Insurance coverage will need to be confirmed)  06/14/2030   DTaP/Tdap/Td (3 - Td or Tdap) 07/08/2032   Hepatitis C Screening  Completed   HIV Screening  Completed   HPV VACCINES  Aged Out   INFLUENZA VACCINE  Discontinued   COVID-19 Vaccine  Discontinued    Skin cancer:   previously had spots removed - [facility] - annually  Pt reports no hx of skin cancer, suspicious lesions/biopsies in the past.  Colorectal cancer:  colonoscopy is UTD Pt denies change in bowels  Prostate cancer:  Prostate cancer screening with PSA: Discussed risks and benefits of PSA testing and provided handout. Pt declines to have PSA drawn today. No results found  for: "PSA"  Urinary Symptoms:  none  Lung cancer:  Low Dose CT Chest recommended if Age 70-80 years, 20 pack-year currently smoking OR have quit w/in 15years. Patient does not qualify.   Social History   Tobacco Use   Smoking status: Former    Packs/day: 1.00    Years: 25.00    Additional pack years: 0.00    Total pack years: 25.00    Types: Cigarettes    Start date: 03/29/1989    Quit date: 02/08/2015    Years since quitting: 7.4   Smokeless tobacco: Never  Substance Use Topics   Alcohol use: Not Currently     Alcohol screening: Flowsheet Row Office Visit from  07/09/2022 in [facility]  AUDIT-C Score 1       AAA:  The USPSTF recommends one-time screening with ultrasonography in men ages 34 to 36 years who have ever smoked  BJY:NWGN indicated today  Blood pressure/Hypertension: BP Readings from Last 3 Encounters:  07/09/22 110/76  06/25/22 108/83  12/31/21 116/74   Weight/Obesity: Wt Readings from Last 3 Encounters:  07/09/22 240 lb 8 oz (109.1 kg)  12/31/21 254 lb 14.4 oz (115.6 kg)  09/22/21 274 lb (124.3 kg)   BMI Readings from Last 3 Encounters:  07/09/22 32.17 kg/m  12/31/21 33.17 kg/m  09/22/21 36.15 kg/m    Lipids:  Lab Results  Component Value Date   CHOL 160 04/06/2021   CHOL 199 01/01/2021   CHOL 132 04/29/2020   Lab Results  Component Value Date   HDL 26 (L) 04/06/2021   HDL 25 (L) 01/01/2021   HDL 30 (L) 04/29/2020   Lab Results  Component Value Date   LDLCALC 105 (H) 04/06/2021   LDLCALC 116 (H) 01/01/2021   LDLCALC 79 04/29/2020   Lab Results  Component Value Date   TRIG 163 (H) 04/06/2021   TRIG 331 (H) 01/01/2021   TRIG 129 04/29/2020   Lab Results  Component Value Date   CHOLHDL 6.2 (H) 04/06/2021   CHOLHDL 8.0 (H) 01/01/2021   CHOLHDL 4.4 04/29/2020   No results found for: "LDLDIRECT" Based on the results of lipid panel his/her cardiovascular risk factor ( using Poole Cohort )  in the next 10 years is : The 10-year ASCVD risk score (Arnett DK, et al., 2019) is: 7.1%   Values used to calculate the score:     Age: 49 years     Sex: Male     Is Non-Hispanic African American: No     Diabetic: Yes     Tobacco smoker: No     Systolic Blood Pressure: 110 mmHg     Is BP treated: Yes     HDL Cholesterol: 26 mg/dL     Total Cholesterol: 160 mg/dL Glucose:  Glucose  Date Value Ref Range Status  04/06/2021 193 (H) 70 - 99 mg/dL Final  56/21/3086 578 (H) 70 - 99 mg/dL Final    Comment:                  **Please note reference interval change**   Glucose, Bld   Date Value Ref Range Status  10/31/2020 177 (H) 65 - 99 mg/dL Final    Comment:    .            Fasting reference interval . For someone without known diabetes, a glucose value >125 mg/dL indicates that they may have diabetes and this should be confirmed with a follow-up test. .  04/29/2020 107 (H) 65 - 99 mg/dL Final    Comment:    .            Fasting reference interval . For someone without known diabetes, a glucose value between 100 and 125 mg/dL is consistent with prediabetes and should be confirmed with a follow-up test. .   01/22/2020 182 (H) 65 - 99 mg/dL Final    Comment:    .            Fasting reference interval . For someone without known diabetes, a glucose value >125 mg/dL indicates that they may have diabetes and this should be confirmed with a follow-up test. .    Glucose-Capillary  Date Value Ref Range Status  06/13/2020 113 (H) 70 - 99 mg/dL Final    Comment:    Glucose reference range applies only to samples taken after fasting for at least 8 hours.  05/06/2017 164 (H) 65 - 99 mg/dL Final  47/11/2955 473 (H) 65 - 99 mg/dL Final    Social History       Social History   Socioeconomic History   Marital status: Married    Spouse name: Tammy   Number of children: 1   Years of education: 12   Highest education level: Associate degree: academic program  Occupational History   Occupation: cable maintenance    Employer: [employer]  Tobacco Use   Smoking status: Former    Packs/day: 1.00    Years: 25.00    Additional pack years: 0.00    Total pack years: 25.00    Types: Cigarettes    Start date: 03/29/1989    Quit date: 02/08/2015    Years since quitting: 7.4   Smokeless tobacco: Never  Vaping Use   Vaping Use: Never used  Substance and Sexual Activity   Alcohol use: Not Currently   Drug use: No   Sexual activity: Yes    Partners: Female  Other Topics Concern   Not on file  Social History Narrative   Not on file   Social  Determinants of Health   Financial Resource Strain: Low Risk  (07/09/2022)   Overall Financial Resource Strain (CARDIA)    Difficulty of Paying Living Expenses: Not hard at all  Food Insecurity: No Food Insecurity (07/09/2022)   Hunger Vital Sign    Worried About Running Out of Food in the Last Year: Never true    Ran Out of Food in the Last Year: Never true  Transportation Needs: No Transportation Needs (07/09/2022)   PRAPARE - Administrator, Civil Service (Medical): No    Lack of Transportation (Non-Medical): No  Physical Activity: Sufficiently Active (07/09/2022)   Exercise Vital Sign    Days of Exercise per Week: 4 days    Minutes of Exercise per Session: 50 min  Stress: No Stress Concern Present (07/09/2022)   [employer] of Occupational Health - Occupational Stress Questionnaire    Feeling of Stress : Not at all  Social Connections: Moderately Isolated (07/09/2022)   Social Connection and Isolation Panel [NHANES]    Frequency of Communication with Friends and Family: More than three times a week    Frequency of Social Gatherings with Friends and Family: More than three times a week    Attends Religious Services: Never    Database administrator or Organizations: No    Attends Banker Meetings: Never    Marital Status: Married    Family History  Family History  Problem Relation Age of Onset   Diabetes type II Mother        resolved after GOP   Diabetes type I Sister        type 1   Diabetes Sister    Dementia Maternal Grandmother    AAA (abdominal aortic aneurysm) Maternal Grandfather    Hypertension Maternal Grandfather    Heart disease Maternal Uncle     Patient Active Problem List   Diagnosis Date Noted   Pulmonary nodules 01/29/2021   Hypertension associated with type 2 diabetes mellitus 05/01/2018   Type 2 diabetes mellitus with hyperglycemia, with long-term current use of insulin 05/01/2018   Erectile dysfunction 04/27/2017    Encounter for long-term (current) use of high-risk medication 03/05/2016   Rheumatoid arthritis, seropositive 01/23/2016   Obesity 04/18/2015   Abdominal aortic atherosclerosis (HCC) 11/16/2014   Hyperlipidemia associated with type 2 diabetes mellitus    Coronary artery disease, non-occlusive 01/09/2012    Past Surgical History:  Procedure Laterality Date   APPENDECTOMY     CHOLECYSTECTOMY N/A 05/06/2017   Procedure: LAPAROSCOPIC CHOLECYSTECTOMY;  Surgeon: Ancil Linsey, MD;  Location: [facility] ORS;  Service: General;  Laterality: N/A;   COLONOSCOPY WITH PROPOFOL N/A 06/13/2020   Procedure: COLONOSCOPY WITH PROPOFOL;  Surgeon: Pasty Spillers, MD;  Location: [facility] ENDOSCOPY;  Service: Endoscopy;  Laterality: N/A;   TONSILLECTOMY     TONSILLECTOMY     VASECTOMY  03/2013     Current Outpatient Medications:    anastrozole (ARIMIDEX) 1 MG tablet, Take 1 mg by mouth once a week., Disp: , Rfl:    aspirin EC 81 MG tablet, Take 81 mg by mouth daily., Disp: , Rfl:    atorvastatin (LIPITOR) 40 MG tablet, Take 1 tablet (40 mg total) by mouth daily., Disp: 90 tablet, Rfl: 3   cholecalciferol (VITAMIN D) 1000 units tablet, Take 1 tablet (1,000 Units total) by mouth daily., Disp: , Rfl:    Continuous Blood Gluc Sensor (DEXCOM G7 SENSOR) MISC, USE 1 EACH EVERY 10 (TEN) DAYS, Disp: , Rfl:    empagliflozin (JARDIANCE) 25 MG TABS tablet, Take 1 tablet (25 mg total) by mouth daily., Disp: 90 tablet, Rfl: 3   Insulin NPH, Human,, Isophane, (HUMULIN N) 100 UNIT/ML Kiwkpen, Inject into the skin., Disp: , Rfl:    lisinopril (ZESTRIL) 10 MG tablet, TAKE 1 TABLET DAILY, Disp: 90 tablet, Rfl: 3   Multiple Vitamin (MULTIVITAMIN WITH MINERALS) TABS tablet, Take 1 tablet by mouth daily., Disp: , Rfl:    OZEMPIC, 1 MG/DOSE, 4 MG/3ML SOPN, INJECT 1MG  SUBCUTANEOUSLY  ONCE WEEKLY (EVERY 7 DAYS), Disp: 9 mL, Rfl: 0   tadalafil (CIALIS) 20 MG tablet, Take 1 tablet (20 mg total) by mouth daily as needed for erectile  dysfunction., Disp: 30 tablet, Rfl: 3   tadalafil (CIALIS) 5 MG tablet, Take 1 tablet (5 mg total) by mouth daily as needed for erectile dysfunction., Disp: 90 tablet, Rfl: 3   TRESIBA FLEXTOUCH 200 UNIT/ML FlexTouch Pen, SMARTSIG:60 Unit(s) SUB-Q Every Night, Disp: , Rfl:    fluticasone (FLONASE) 50 MCG/ACT nasal spray, Place 2 sprays into both nostrils daily. (Patient not taking: Reported on 12/31/2021), Disp: 16 g, Rfl: 2   meclizine (ANTIVERT) 12.5 MG tablet, Take 1 tablet (12.5 mg total) by mouth 3 (three) times daily as needed for dizziness., Disp: 30 tablet, Rfl: 0  No Known Allergies  Patient Care Team: Danelle Berry, PA-C as PCP - General (Family Medicine)   Chart Review:  I personally reviewed active problem list, medication list, allergies, family history, social history, health maintenance, notes from last encounter, lab results, imaging with the patient/caregiver today.   Review of Systems  Constitutional: Negative.   HENT: Negative.    Eyes: Negative.   Respiratory: Negative.    Cardiovascular: Negative.   Gastrointestinal: Negative.   Endocrine: Negative.   Genitourinary: Negative.   Musculoskeletal: Negative.   Skin: Negative.   Allergic/Immunologic: Negative.   Neurological: Negative.   Hematological: Negative.   Psychiatric/Behavioral: Negative.    All other systems reviewed and are negative.         Objective:   Vitals:  Vitals:   07/09/22 0939  BP: 110/76  Pulse: 85  Resp: 16  Temp: 97.8 F (36.6 C)  TempSrc: Oral  SpO2: 97%  Weight: 240 lb 8 oz (109.1 kg)  Height: 6' 0.5" (1.842 m)    Body mass index is 32.17 kg/m.  Physical Exam Vitals and nursing note reviewed.  Constitutional:      General: He is not in acute distress.    Appearance: Normal appearance. He is well-developed, well-groomed and overweight. He is not ill-appearing, toxic-appearing or diaphoretic.  HENT:     Head: Normocephalic and atraumatic.     Jaw: No trismus.     Right  Ear: Tympanic membrane, ear canal and external ear normal.     Left Ear: Tympanic membrane, ear canal and external ear normal.     Nose: Congestion present. No mucosal edema or rhinorrhea.     Right Sinus: No maxillary sinus tenderness or frontal sinus tenderness.     Left Sinus: No maxillary sinus tenderness or frontal sinus tenderness.     Mouth/Throat:     Mouth: Mucous membranes are moist.     Pharynx: Oropharynx is clear. Uvula midline. Posterior oropharyngeal erythema present. No oropharyngeal exudate or uvula swelling.  Eyes:     General: Lids are normal. No scleral icterus.       Right eye: No discharge.        Left eye: No discharge.     Conjunctiva/sclera: Conjunctivae normal.  Neck:     Trachea: Trachea and phonation normal. No tracheal deviation.  Cardiovascular:     Rate and Rhythm: Normal rate and regular rhythm.     Pulses: Normal pulses.          Radial pulses are 2+ on the right side and 2+ on the left side.       Posterior tibial pulses are 2+ on the right side and 2+ on the left side.     Heart sounds: Normal heart sounds. No murmur heard.    No friction rub. No gallop.  Pulmonary:     Effort: Pulmonary effort is normal. No respiratory distress.     Breath sounds: Normal breath sounds. No stridor. No wheezing, rhonchi or rales.  Abdominal:     General: Bowel sounds are normal. There is no distension.     Palpations: Abdomen is soft.  Musculoskeletal:     Cervical back: Normal range of motion and neck supple.     Right lower leg: No edema.     Left lower leg: No edema.  Lymphadenopathy:     Cervical: No cervical adenopathy.  Skin:    General: Skin is warm and dry.     Capillary Refill: Capillary refill takes less than 2 seconds.     Findings: No rash.  Neurological:     Mental Status: He is alert. Mental  status is at baseline.     Gait: Gait normal.  Psychiatric:        Mood and Affect: Mood normal.        Speech: Speech normal.        Behavior: Behavior  normal. Behavior is cooperative.      Recent Results (from the past 2160 hour(s))  Hemoglobin A1c     Status: None   Collection Time: 06/10/22 12:00 AM  Result Value Ref Range   Hemoglobin A1C 7.1     Comment: KC    Fall Risk:    07/09/2022    9:31 AM 12/31/2021   10:02 AM 05/08/2021    9:32 AM 10/31/2020    8:54 AM 04/29/2020   10:04 AM  Fall Risk   Falls in the past year? 0 0 0 0 0  Number falls in past yr: 0 0 0 0 0  Injury with Fall? 0 0 0 0 0  Risk for fall due to : No Fall Risks No Fall Risks     Follow up Falls prevention discussed;Education provided;Falls evaluation completed Falls prevention discussed;Education provided Falls evaluation completed      Functional Status Survey: Is the patient deaf or have difficulty hearing?: No Does the patient have difficulty seeing, even when wearing glasses/contacts?: No Does the patient have difficulty concentrating, remembering, or making decisions?: No Does the patient have difficulty walking or climbing stairs?: No Does the patient have difficulty dressing or bathing?: No Does the patient have difficulty doing errands alone such as visiting a doctor's office or shopping?: No   Assessment & Plan:    CPE completed today  Prostate cancer screening and PSA options (with potential risks and benefits of testing vs not testing) were discussed along with recent recs/guidelines, shared decision making and handout/information given to pt today  USPSTF grade A and B recommendations reviewed with patient; age-appropriate recommendations, preventive care, screening tests, etc discussed and encouraged; healthy living encouraged; see AVS for patient education given to patient  Discussed importance of 150 minutes of physical activity weekly, AHA exercise recommendations given to pt in AVS/handout  Discussed importance of healthy diet:  eating lean meats and proteins, avoiding trans fats and saturated fats, avoid simple sugars and excessive  carbs in diet, eat 6 servings of fruit/vegetables daily and drink plenty of water and avoid sweet beverages.  DASH diet reviewed if pt has HTN  Recommended pt to do annual eye exam and routine dental exams/cleanings  Advance Care planning information and packet discussed and offered today, encouraged pt to discuss with family members/spouse/partner/friends and complete Advanced directive packet and bring copy to office   Reviewed Health Maintenance: Health Maintenance  Topic Date Due   Diabetic kidney evaluation - eGFR measurement  04/06/2022   Diabetic kidney evaluation - Urine ACR  10/16/2022   OPHTHALMOLOGY EXAM  11/12/2022   HEMOGLOBIN A1C  12/11/2022   FOOT EXAM  01/01/2023   COLONOSCOPY (Pts 45-78yrs Insurance coverage will need to be confirmed)  06/14/2030   DTaP/Tdap/Td (3 - Td or Tdap) 07/08/2032   Hepatitis C Screening  Completed   HIV Screening  Completed   HPV VACCINES  Aged Out   INFLUENZA VACCINE  Discontinued   COVID-19 Vaccine  Discontinued    Immunizations: Immunization History  Administered Date(s) Administered   Influenza,inj,Quad PF,6+ Mos 04/18/2015, 01/09/2016, 01/04/2018   Influenza-Unspecified 12/21/2013   Pneumococcal Polysaccharide-23 09/14/2013   Td 03/29/2010   Tdap 07/09/2022   Vaccines:  HPV: up to at  age 66 , ask insurance if age between 43-45  Shingrix: 33-64 yo and ask insurance if covered when patient above 81 yo Pneumonia: done previously - will need booster later- educated and discussed with patient.     Annual physical exam    -  Primary   Relevant Orders   COMPLETE METABOLIC PANEL WITH GFR   CBC with Differential/Platelet   Lipid panel    Problem List Items Addressed This Visit       Cardiovascular and Mediastinum   Hypertension associated with type 2 diabetes mellitus (Chronic)    BP at goal today, well controlled and stable on lisinopril 10 mg daily       Relevant Medications   Semaglutide, 2 MG/DOSE, (OZEMPIC, 2 MG/DOSE,)  8 MG/3ML SOPN     Endocrine   Hyperlipidemia associated with type 2 diabetes mellitus (Chronic)    On statin, tolerating, checking labs today      Relevant Medications   Semaglutide, 2 MG/DOSE, (OZEMPIC, 2 MG/DOSE,) 8 MG/3ML SOPN   Type 2 diabetes mellitus with hyperglycemia, with long-term current use of insulin (Chronic)    Per endocrinology, on basal insulin, jardiance, ozempic - dose just increased to 2 DM foot exam done, due for eye exam in Aug 2024, A1C and UACR reviewed through care everywhere labs      Relevant Medications   Semaglutide, 2 MG/DOSE, (OZEMPIC, 2 MG/DOSE,) 8 MG/3ML SOPN     Other   Obesity (Chronic)    Losing weight, working out, diet/nutrition changes      Relevant Medications   Semaglutide, 2 MG/DOSE, (OZEMPIC, 2 MG/DOSE,) 8 MG/3ML SOPN   Erectile dysfunction    Per urology on cialis      Low testosterone    Per specialists - testosterone pellets and on arimidex      Other Visit Diagnoses                    Need for Tdap vaccination       Relevant Orders   Tdap vaccine greater than or equal to 7yo IM (Completed)   Screening for malignant neoplasm of colon       inadequate prep for colonoscopy in 2022, he never heard back to be rescheduled from [facility] - referral entered again   Relevant Orders   Ambulatory referral to Gastroenterology             Danelle Berry, PA-C 07/09/22 9:53 AM  [facility]

## 2022-07-13 ENCOUNTER — Other Ambulatory Visit: Payer: Self-pay | Admitting: Family Medicine

## 2022-07-13 DIAGNOSIS — I10 Essential (primary) hypertension: Secondary | ICD-10-CM

## 2022-07-13 DIAGNOSIS — E119 Type 2 diabetes mellitus without complications: Secondary | ICD-10-CM

## 2022-07-13 MED ORDER — LISINOPRIL 10 MG PO TABS: 10.0000 mg | ORAL_TABLET | Freq: Every day | ORAL | 3 refills | Status: DC

## 2022-07-19 ENCOUNTER — Encounter: Payer: Self-pay | Admitting: *Deleted

## 2022-08-20 ENCOUNTER — Ambulatory Visit
Admission: RE | Admit: 2022-08-20 | Discharge: 2022-08-20 | Disposition: A | Discharge status: Home / Self Care | Payer: BC Managed Care – PPO | Source: Ambulatory Visit | Attending: Urgent Care | Admitting: Urgent Care

## 2022-08-20 VITALS — BP 131/66 | HR 75 | Temp 97.6°F | Resp 16

## 2022-08-20 DIAGNOSIS — M778 Other enthesopathies, not elsewhere classified: Secondary | ICD-10-CM | POA: Diagnosis not present

## 2022-08-20 MED ORDER — PREDNISONE 20 MG PO TABS
40.0000 mg | ORAL_TABLET | Freq: Every day | ORAL | 0 refills | Status: AC
Start: 2022-08-20 — End: 2022-08-25

## 2022-08-20 NOTE — ED Triage Notes (Signed)
Patient presents to UC for left middle finger swelling x 3 days. Denies injury. Pain with movement. Taking aleve. Last dose last night.

## 2022-08-20 NOTE — Discharge Instructions (Signed)
Follow-up by scheduling an evaluation by your provider who is caring for your tendinitis/carpal tunnel.

## 2022-08-20 NOTE — ED Provider Notes (Signed)
Nathan Rojas    CSN: [id number] Arrival date & time: 08/20/22  1054      History   Chief Complaint Chief Complaint  Patient presents with   Hand Problem    Entered by patient    HPI Nathan Rojas is a 47 y.o. male.   HPI  Patient presents to urgent care with complaint of left middle finger swelling x 3 days.  He denies injury or precipitating event.  Endorses pain with movement.  Using naproxen for pain relief.  PMH includes DM 2 with recent A1c equals 7.1.  He denies any history of previous episodes of the symptoms.  Review of the patient's chart indicates treatment for de Quervain's tenosynovitis.  Most recent treatment was 05/12/2022 with injection of betamethasone into his wrists.  Patient states this treatment is for "carpal tunnel" and bilateral wrists.  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  He denies any increased workload or change in repetitive stress.  Past Medical History:  Diagnosis Date   Chronic pain of multiple joints 01/23/2016   CKD (chronic kidney disease)    Coronary artery disease, non-occlusive 01/09/2012   Coronary CT Angiogram: Coronary Calcium Score 92.  CAD RADS 2.  Mild-nonobstructive disease in the LAD (25-49%).  Minimal (<25%) disease in RCA and LCx.  Right dominant system normal origins.   CRP elevated 01/23/2016   Flexor tenosynovitis of thumb 05/27/2016   Hepatic steatosis 12/2020   noted on chest CT.   Hyperlipidemia    Hypertension    Kidney stone 11/22/2014   MRSA infection around age 3   left hand wound   Rheumatoid arthritis, 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HCC) 01/23/2016   Trigeminal neuralgia 06/03/2015   Type 2 diabetes mellitus, uncontrolled    Vitamin D deficiency 07/09/2016    Patient Active Problem List   Diagnosis Date Noted   Low testosterone 07/09/2022   Pulmonary nodules 01/29/2021   Hypertension associated with type 2 diabetes mellitus (HCC) 05/01/2018   Type 2 diabetes mellitus  with hyperglycemia, with long-term current use of insulin (HCC) 05/01/2018   Erectile dysfunction 04/27/2017   Rheumatoid arthritis, 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HCC) 01/23/2016   Obesity 04/18/2015   Abdominal aortic atherosclerosis (HCC) 11/16/2014   Hyperlipidemia associated with type 2 diabetes mellitus (HCC)    Coronary artery disease, non-occlusive 01/09/2012    Past Surgical History:  Procedure Laterality Date   APPENDECTOMY     CHOLECYSTECTOMY N/A 05/06/2017   Procedure: LAPAROSCOPIC CHOLECYSTECTOMY;  Surgeon: Ancil Linsey, MD;  Location: [facility] ORS;  Service: General;  Laterality: N/A;   COLONOSCOPY WITH PROPOFOL N/A 06/13/2020   Procedure: COLONOSCOPY WITH PROPOFOL;  Surgeon: Pasty Spillers, MD;  Location: [facility] ENDOSCOPY;  Service: Endoscopy;  Laterality: N/A;   TONSILLECTOMY     TONSILLECTOMY     VASECTOMY  03/2013       Home Medications    Prior to Admission medications   Medication Sig Start Date End Date Taking? Authorizing Provider  anastrozole (ARIMIDEX) 1 MG tablet Take 1 mg by mouth once a week. 09/14/21   [provider]  aspirin EC 81 MG tablet Take 81 mg by mouth daily.    [provider]  atorvastatin (LIPITOR) 40 MG tablet Take 1 tablet (40 mg total) by mouth daily. 01/01/22   Danelle Berry, PA-C  cholecalciferol (VITAMIN D) 1000 units tablet Take 1 tablet (1,000 Units total) by mouth daily. 01/04/18   Kerman Passey, MD  Continuous Blood Gluc Sensor (DEXCOM G7  SENSOR) MISC USE 1 EACH EVERY 10 (TEN) DAYS 06/28/22   [provider]  empagliflozin (JARDIANCE) 25 MG TABS tablet Take 1 tablet (25 mg total) by mouth daily. 12/31/21   Danelle Berry, PA-C  lisinopril (ZESTRIL) 10 MG tablet Take 1 tablet (10 mg total) by mouth daily. 07/13/22   Danelle Berry, PA-C  Multiple Vitamin (MULTIVITAMIN WITH MINERALS) TABS tablet Take 1 tablet by mouth daily.    [provider]  Semaglutide, 2 MG/DOSE, (OZEMPIC, 2 MG/DOSE,) 8 MG/3ML SOPN Inject 2 mg  into the skin once a week.    Sherlon Handing, MD  tadalafil (CIALIS) 20 MG tablet Take 1 tablet (20 mg total) by mouth daily as needed for erectile dysfunction. 09/22/21   Michiel Cowboy A, PA-C  tadalafil (CIALIS) 5 MG tablet Take 1 tablet (5 mg total) by mouth daily as needed for erectile dysfunction. 09/22/21   McGowan, Carollee Herter A, PA-C  TRESIBA FLEXTOUCH 200 UNIT/ML FlexTouch Pen SMARTSIG:60 Unit(s) SUB-Q Every Night 09/17/21   [provider]    Family History Family History  Problem Relation Age of Onset   Diabetes type II Mother        resolved after GOP   Diabetes type I Sister        type 1   Diabetes Sister    Dementia Maternal Grandmother    AAA (abdominal aortic aneurysm) Maternal Grandfather    Hypertension Maternal Grandfather    Heart disease Maternal Uncle     Social History Social History   Tobacco Use   Smoking status: Former    Packs/day: 1.00    Years: 25.00    Additional pack years: 0.00    Total pack years: 25.00    Types: Cigarettes    Start date: 03/29/1989    Quit date: 02/08/2015    Years since quitting: 7.5   Smokeless tobacco: Never  Vaping Use   Vaping Use: Never used  Substance Use Topics   Alcohol use: Not Currently   Drug use: No     Allergies   Patient has no known allergies.   Review of Systems Review of Systems   Physical Exam Triage Vital Signs ED Triage Vitals  Enc Vitals Group     BP 08/20/22 1130 131/66     Pulse Rate 08/20/22 1130 75     Resp 08/20/22 1130 16     Temp 08/20/22 1130 97.6 F (36.4 C)     Temp Source 08/20/22 1130 Temporal     SpO2 08/20/22 1130 94 %     Weight --      Height --      Head Circumference --      Peak Flow --      Pain Score 08/20/22 1129 4     Pain Loc --      Pain Edu? --      Excl. in GC? --    No data found.  Updated Vital Signs BP 131/66 (BP Location: Left Arm)   Pulse 75   Temp 97.6 F (36.4 C) (Temporal)   Resp 16   SpO2 94%   Visual Acuity Right Eye  Distance:   Left Eye Distance:   Bilateral Distance:    Right Eye Near:   Left Eye Near:    Bilateral Near:     Physical Exam Vitals reviewed.  Constitutional:      Appearance: Normal appearance.  Musculoskeletal:     Left hand: Swelling and tenderness present. Decreased range of  motion.       Hands:     Comments: Tenderness to the left third finger when the finger is bumped at the distal and.  Skin:    General: Skin is warm and dry.  Neurological:     General: No focal deficit present.     Mental Status: He is alert and oriented to person, place, and time.  Psychiatric:        Mood and Affect: Mood normal.        Behavior: Behavior normal.      UC Treatments / Results  Labs (all labs ordered are listed, but only abnormal results are displayed) Labs Reviewed - No data to display  EKG   Radiology No results found.  Procedures Procedures (including critical care time)  Medications Ordered in UC Medications - No data to display  Initial Impression / Assessment and Plan / UC Course  I have reviewed the triage vital signs and the nursing notes.  Pertinent labs & imaging results that were available during my care of the patient were reviewed by me and considered in my medical decision making (see chart for details).   Suspect tendinitis of left hand, specifically affecting the third metatarsal.  Referring patient back to his provider for possible need of evaluation by a hand specialist.  Offered treatment with corticosteroid today warning patient that it would cause his blood glucose to rise temporarily during treatment.  Patient acknowledges this and states that his diabetes has been very well-controlled with diet and weight loss alone.  Reviewed chart history.   Counseled patient on potential for adverse effects with medications prescribed/recommended today, ER and return-to-clinic precautions discussed, patient verbalized understanding and agreement with care  plan.    Final Clinical Impressions(s) / UC Diagnoses   Final diagnoses:  None   Discharge Instructions   None    ED Prescriptions   None    PDMP not reviewed this encounter.   Charma Igo, [place] 08/20/22 1148

## 2022-08-28 ENCOUNTER — Emergency Department
Admission: EM | Admit: 2022-08-28 | Discharge: 2022-08-28 | Disposition: A | Discharge status: Home / Self Care | Payer: BC Managed Care – PPO | Attending: Emergency Medicine | Admitting: Emergency Medicine

## 2022-08-28 ENCOUNTER — Encounter: Payer: Self-pay | Admitting: Emergency Medicine

## 2022-08-28 DIAGNOSIS — E119 Type 2 diabetes mellitus without complications: Secondary | ICD-10-CM | POA: Diagnosis not present

## 2022-08-28 DIAGNOSIS — I2510 Atherosclerotic heart disease of native coronary artery without angina pectoris: Secondary | ICD-10-CM | POA: Insufficient documentation

## 2022-08-28 DIAGNOSIS — I10 Essential (primary) hypertension: Secondary | ICD-10-CM | POA: Insufficient documentation

## 2022-08-28 DIAGNOSIS — M79642 Pain in left hand: Secondary | ICD-10-CM | POA: Diagnosis not present

## 2022-08-28 MED ORDER — PREDNISONE 10 MG (21) PO TBPK: ORAL_TABLET | ORAL | 0 refills | Status: DC

## 2022-08-28 MED ORDER — NAPROXEN 500 MG PO TABS: 500.0000 mg | ORAL_TABLET | Freq: Two times a day (BID) | ORAL | 0 refills | Status: AC

## 2022-08-28 MED ORDER — KETOROLAC TROMETHAMINE 15 MG/ML IJ SOLN
15.0000 mg | Freq: Once | INTRAMUSCULAR | Status: AC
  Administered 2022-08-28: 15 mg via INTRAMUSCULAR
  Filled 2022-08-28: qty 1

## 2022-08-28 NOTE — ED Provider Notes (Signed)
[facility] Provider Note    None    (approximate)   History   Hand Pain   HPI  Nathan Rojas is a 47 y.o. male with a past medical history of carpal tunnel syndrome for which she receives injections by [facility]opedic [facility] presents today for exacerbation of his carpal tunnel.  He reports that his symptoms feel exactly the same as they normally do.  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  He denies any specific injury.  He reports that he has wrist once at home which she has used for the last couple of days at night.  He has not yet made an appointment with the orthopedic specialist for another injection.  Patient Active Problem List   Diagnosis Date Noted   Low testosterone 07/09/2022   Pulmonary nodules 01/29/2021   Hypertension associated with type 2 diabetes mellitus (HCC) 05/01/2018   Type 2 diabetes mellitus with hyperglycemia, with long-term current use of insulin (HCC) 05/01/2018   Erectile dysfunction 04/27/2017   Rheumatoid arthritis, 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HCC) 01/23/2016   Obesity 04/18/2015   Abdominal aortic atherosclerosis (HCC) 11/16/2014   Hyperlipidemia associated with type 2 diabetes mellitus (HCC)    Coronary artery disease, non-occlusive 01/09/2012          Physical Exam   Triage Vital Signs: ED Triage Vitals  Enc Vitals Group     BP 08/28/22 0409 (!) 154/95     Pulse Rate 08/28/22 0409 84     Resp 08/28/22 0409 16     Temp 08/28/22 0409 98.4 F (36.9 C)     Temp Source 08/28/22 0409 Oral     SpO2 08/28/22 0409 99 %     Weight --      Height --      Head Circumference --      Peak Flow --      Pain Score 08/28/22 0408 10     Pain Loc --      Pain Edu? --      Excl. in GC? --     Most recent vital signs: Vitals:   08/28/22 0409 08/28/22 0642  BP: (!) 154/95 (!) 140/88  Pulse: 84 81  Resp: 16 17  Temp: 98.4 F (36.9 C) 98.3 F  (36.8 C)  SpO2: 99% 98%    Physical Exam Vitals and nursing note reviewed.  Constitutional:      General: Awake and alert. No acute distress.    Appearance: Normal appearance. The patient is normal weight.  HENT:     Head: Normocephalic and atraumatic.     Mouth: Mucous membranes are moist.  Eyes:     General: PERRL. Normal EOMs        Right eye: No discharge.        Left eye: No discharge.     Conjunctiva/sclera: Conjunctivae normal.  Cardiovascular:     Rate and Rhythm: Normal rate and regular rhythm.     Pulses: Normal pulses.  Pulmonary:     Effort: Pulmonary effort is normal. No respiratory distress.     Breath sounds: Normal breath sounds.  Abdominal:     Abdomen is soft. There is no abdominal tenderness. No rebound or guarding. No distention. Musculoskeletal:        General: No swelling. Normal range of motion.     Cervical back: Normal range of motion and neck supple.  Left wrist/hand: No obvious deformity, erythema, ecchymosis, or swelling.  He has tenderness over the dorsum of the wrist and radial styloid.  He has a positive Tinel sign at the wrist.  He has a positive Finkelstein test.  Compartments are soft and compressible throughout.  Normal intrinsic muscle function of the hand.  Normal radial pulse.  No wounds noted. Skin:    General: Skin is warm and dry.     Capillary Refill: Capillary refill takes less than 2 seconds.     Findings: No rash.  Neurological:     Mental Status: The patient is awake and alert.      ED Results / Procedures / Treatments   Labs (all labs ordered are listed, but only abnormal results are displayed) Labs Reviewed - No data to display   EKG     RADIOLOGY     PROCEDURES:  Critical Care performed:   Procedures   MEDICATIONS ORDERED IN ED: Medications  ketorolac (TORADOL) 15 MG/ML injection 15 mg (15 mg Intramuscular Given 08/28/22 0720)     IMPRESSION / MDM / ASSESSMENT AND PLAN / ED COURSE  I reviewed the  triage vital signs and the nursing notes.   Differential diagnosis includes, but is not limited to, carpal tunnel syndrome, decor veins tenosynovitis, osteoarthritis.  I reviewed the patient's chart.  Patient has been diagnosed with decor veins tenosynovitis and biceps tendinopathy and is seen [facility] orthopedics.  He last saw the clinic in February.  He gets betamethasone and Marcaine injected into the tendon sheath.  Patient presents emergency department awake and alert, hemodynamically stable and afebrile.  He has pain with making a fist but he is able to, and he has normal intrinsic muscle function of the hand.  He has a positive Tinel sign at the wrist.  He also has a positive Finkelstein test.  He has tenderness over his radial styloid and dorsum of the wrist.  There is no deformity, erythema, or swelling.  Not consistent with septic joint.  I advised patient that we are unable to do injections into the tendon sheath and this needs to be done by the orthopedic department.  I instructed him to call them in order to make this appointment.  In the meantime he will be treated with NSAIDs and steroids.  We discussed that he should not take the naproxen with any other NSAID.  We discussed return precautions and the importance of close outpatient follow-up.  I also advised that he use his braces at night.  Patient presents and agrees with plan.  He was discharged in stable condition.  Patient's presentation is most consistent with exacerbation of chronic illness.     FINAL CLINICAL IMPRESSION(S) / ED DIAGNOSES   Final diagnoses:  Left hand pain     Rx / DC Orders   ED Discharge Orders          Ordered    predniSONE (STERAPRED UNI-PAK 21 TAB) 10 MG (21) TBPK tablet        08/28/22 0714    naproxen (NAPROSYN) 500 MG tablet  2 times daily with meals        08/28/22 1610             Note:  This document was prepared using Dragon voice recognition software and may include  unintentional dictation errors.   Keturah Shavers 08/28/22 [id number]    Jene Every, MD 08/28/22 [phone]

## 2022-08-28 NOTE — ED Notes (Deleted)
x

## 2022-08-28 NOTE — ED Triage Notes (Signed)
Pt having carpal tunnel flare up. Hx of such for years. Does not take anything for it. Pain radiates from forearm down to fingers. Pulses intact and normal color and ROM

## 2022-08-28 NOTE — Discharge Instructions (Signed)
Please call your orthopedist to arrange an appointment for your steroid injection.  In the meantime, you may take the medications as prescribed.  Please return for any new, worsening, or change in symptoms or other concerns.  It was a pleasure caring for you today.

## 2022-09-07 ENCOUNTER — Telehealth: Payer: Self-pay

## 2022-09-07 NOTE — Telephone Encounter (Signed)
Returned patients call to schedule his colonoscopy.  LVM for pt to return my call.  His last colonoscopy was performed by Dr. Maximino Greenland on 06/13/20 however she noted "The quality of bowel preparation was fair except the ascending colon was poor and the cecum was poor".  Thanks, [place], [place]

## 2022-09-07 NOTE — Telephone Encounter (Signed)
PT left message to schedule colonoscopy please return call 

## 2022-09-10 ENCOUNTER — Other Ambulatory Visit: Payer: Self-pay

## 2022-09-10 ENCOUNTER — Telehealth: Payer: Self-pay

## 2022-09-10 DIAGNOSIS — Z1211 Encounter for screening for malignant neoplasm of colon: Secondary | ICD-10-CM

## 2022-09-10 MED ORDER — PEG 3350-KCL-NA BICARB-NACL 420 G PO SOLR: 4000.0000 mL | Freq: Once | ORAL | 0 refills | Status: AC

## 2022-09-10 NOTE — Telephone Encounter (Signed)
Gastroenterology Pre-Procedure Review  Request Date: 11/05/22 Requesting Physician: Dr. Tobi Bastos  PATIENT REVIEW QUESTIONS: The patient responded to the following health history questions as indicated:    1. Are you having any GI issues? no 2. Do you have a personal history of Polyps? no 3. Do you have a family history of Colon Cancer or Polyps? no 4. Diabetes Mellitus?Yes takes Jardiance stop on 08/06 and Ozempic stop 10/29/22 STOP DATES NOTED ON INSTRUCTIONS 5. Joint replacements in the past 12 months?no 6. Major health problems in the past 3 months?no 7. Any artificial heart valves, MVP, or defibrillator?no    MEDICATIONS & ALLERGIES:    Patient reports the following regarding taking any anticoagulation/antiplatelet therapy:   Plavix, Coumadin, Eliquis, Xarelto, Lovenox, Pradaxa, Brilinta, or Effient? no Aspirin? no  Patient confirms/reports the following medications:  Current Outpatient Medications  Medication Sig Dispense Refill   anastrozole (ARIMIDEX) 1 MG tablet Take 1 mg by mouth once a week.     aspirin EC 81 MG tablet Take 81 mg by mouth daily.     atorvastatin (LIPITOR) 40 MG tablet Take 1 tablet (40 mg total) by mouth daily. 90 tablet 3   cholecalciferol (VITAMIN D) 1000 units tablet Take 1 tablet (1,000 Units total) by mouth daily.     Continuous Blood Gluc Sensor (DEXCOM G7 SENSOR) MISC USE 1 EACH EVERY 10 (TEN) DAYS     empagliflozin (JARDIANCE) 25 MG TABS tablet Take 1 tablet (25 mg total) by mouth daily. 90 tablet 3   lisinopril (ZESTRIL) 10 MG tablet Take 1 tablet (10 mg total) by mouth daily. 90 tablet 3   Multiple Vitamin (MULTIVITAMIN WITH MINERALS) TABS tablet Take 1 tablet by mouth daily.     predniSONE (STERAPRED UNI-PAK 21 TAB) 10 MG (21) TBPK tablet Take 6 tablets by mouth on day 1 and decrease by 1 tablet for each subsequent day 21 tablet 0   Semaglutide, 2 MG/DOSE, (OZEMPIC, 2 MG/DOSE,) 8 MG/3ML SOPN Inject 2 mg into the skin once a week.     tadalafil  (CIALIS) 20 MG tablet Take 1 tablet (20 mg total) by mouth daily as needed for erectile dysfunction. 30 tablet 3   tadalafil (CIALIS) 5 MG tablet Take 1 tablet (5 mg total) by mouth daily as needed for erectile dysfunction. 90 tablet 3   TRESIBA FLEXTOUCH 200 UNIT/ML FlexTouch Pen SMARTSIG:60 Unit(s) SUB-Q Every Night     No current facility-administered medications for this visit.    Patient confirms/reports the following allergies:  No Known Allergies  No orders of the defined types were placed in this encounter.   AUTHORIZATION INFORMATION Primary Insurance: 1D#: Group #:  Secondary Insurance: 1D#: Group #:  SCHEDULE INFORMATION: Date: 11/05/22 Time: Location: [facility]

## 2022-09-23 ENCOUNTER — Ambulatory Visit: Payer: BC Managed Care – PPO | Admitting: Urology

## 2022-10-18 NOTE — Progress Notes (Unsigned)
10/19/22 11:51 AM   Nathan Rojas [birth year] [id number]  Referring provider:  Danelle Berry, PA-C [address] [place],  [place] [address]  Urological history: 1. 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 - Detumescence was able to be achieved after Winter shunt was completed.  2. Nephrolithiasis  -spontaneous passage of left UVJ, 2016   3. Undesired fertility  -vasectomy, 2015   4. Scrotal mass  -scrotal US, 2004 -2.4 X 2.4 X 2.3 CM IN DIAMETER COMPLICATED MASS/NODULE INFERIOR TO THE RIGHT TESTIS  -resolved  5. Hydroceles  -scrotal US, 2004 0- SMALL BILATERAL HYDROCELES   6. Epididymal cyst  -scrotal US, 2004 - TINY SPERMATOCELE VERSUS EPIDIDYMAL CYST RIGHT EPIDIDYMAL HEAD.      Chief Complaint  Patient presents with   Follow-up     HPI: Nathan Rojas is a 47 y.o.male who presents today for a 1 year follow-up.   Previous records reviewed.   SHIM 25 mL  Patient still having spontaneous erections.  He denies any pain or curvature with erections.   He is having no issues with ejaculation.  He is receiving testosterone pellets through  [facility] clinic and he states they are keeping his testosterone at 1000 ng/dL.   He still takes the tadalafil as a precautionary.   SHIM     Row Name 10/19/22 1134         SHIM: Over the last 6 months:   How do you rate  your confidence that you could get and keep an erection? Very High     When you had erections with sexual stimulation, how often were your erections hard enough for penetration (entering your partner)? Almost Always or Always     During sexual intercourse, how often were you able to maintain your erection after you had penetrated (entered) your partner? Almost Always or Always     During sexual intercourse, how difficult was it to maintain your erection to completion of intercourse? Not Difficult     When you attempted sexual intercourse, how often was it satisfactory for you? Almost Always or Always       SHIM Total Score   SHIM 25              SHIM     Row Name 10/19/22 1134         SHIM: Over the last 6 months:   How do you rate your confidence that you could get and keep an erection? Very High     When you had erections with sexual stimulation, how often were your erections hard enough for penetration (entering your partner)? Almost Always or Always     During sexual intercourse, how often were you able to maintain your erection after you had penetrated (entered) your partner? Almost Always or Always     During sexual intercourse, how difficult was it to maintain your erection to completion of intercourse? Not Difficult  When you attempted sexual intercourse, how often was it satisfactory for you? Almost Always or Always       SHIM Total Score   SHIM 25             Score: 1-7 Severe ED 8-11 Moderate ED 12-16 Mild-Moderate ED 17-21 Mild ED 22-25 No ED    PMH: Past Medical History:  Diagnosis Date   Chronic pain of multiple joints 01/23/2016   CKD (chronic kidney disease)    Coronary artery disease, non-occlusive 01/09/2012   Coronary CT Angiogram: Coronary Calcium Score 92.  CAD RADS 2.  Mild-nonobstructive disease in the LAD (25-49%).  Minimal (<25%) disease in RCA and LCx.  Right dominant system normal origins.   CRP elevated 01/23/2016   Flexor  tenosynovitis of thumb 05/27/2016   Hepatic steatosis 12/2020   noted on chest CT.   Hyperlipidemia    Hypertension    Kidney stone 11/22/2014   MRSA infection around age 47   left hand wound   Rheumatoid arthritis, 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HCC) 01/23/2016   Trigeminal neuralgia 06/03/2015   Type 2 diabetes mellitus, uncontrolled    Vitamin D deficiency 07/09/2016    Surgical History: Past Surgical History:  Procedure Laterality Date   APPENDECTOMY     CHOLECYSTECTOMY N/A 05/06/2017   Procedure: LAPAROSCOPIC CHOLECYSTECTOMY;  Surgeon: Ancil Linsey, MD;  Location: [facility] ORS;  Service: General;  Laterality: N/A;   COLONOSCOPY WITH PROPOFOL N/A 06/13/2020   Procedure: COLONOSCOPY WITH PROPOFOL;  Surgeon: Pasty Spillers, MD;  Location: [facility] ENDOSCOPY;  Service: Endoscopy;  Laterality: N/A;   TONSILLECTOMY     TONSILLECTOMY     VASECTOMY  03/2013    Home Medications:  Allergies as of 10/19/2022   No Known Allergies      Medication List        Accurate as of October 19, 2022 11:51 AM. If you have any questions, ask your nurse or doctor.          STOP taking these medications    predniSONE 10 MG (21) Tbpk tablet Commonly known as: STERAPRED UNI-PAK 21 TAB Stopped by: Nathan Rojas       TAKE these medications    anastrozole 1 MG tablet Commonly known as: ARIMIDEX Take 1 mg by mouth once a week.   aspirin EC 81 MG tablet Take 81 mg by mouth daily.   atorvastatin 40 MG tablet Commonly known as: LIPITOR Take 1 tablet (40 mg total) by mouth daily.   cholecalciferol 25 MCG (1000 UNIT) tablet Commonly known as: VITAMIN D3 Take 1 tablet (1,000 Units total) by mouth daily.   Dexcom G7 Sensor Misc USE 1 EACH EVERY 10 (TEN) DAYS   empagliflozin 25 MG Tabs tablet Commonly known as: JARDIANCE Take 1 tablet (25 mg total) by mouth daily.   lisinopril 10 MG tablet Commonly known as: ZESTRIL Take 1 tablet (10 mg total) by mouth daily.   multivitamin with minerals  Tabs tablet Take 1 tablet by mouth daily.   Ozempic (2 MG/DOSE) 8 MG/3ML Sopn Generic drug: Semaglutide (2 MG/DOSE) Inject 2 mg into the skin once a week.   tadalafil 5 MG tablet Commonly known as: Cialis Take 1 tablet (5 mg total) by mouth daily as needed for erectile dysfunction.   tadalafil 20 MG tablet Commonly known as: CIALIS Take 1 tablet (20 mg total) by mouth daily as needed for erectile dysfunction.   Evaristo Bury FlexTouch 200 UNIT/ML FlexTouch Pen Generic drug: insulin degludec SMARTSIG:60 Unit(s) SUB-Q Every Night  Allergies:  No Known Allergies  Family History: Family History  Problem Relation Age of Onset   Diabetes type II Mother        resolved after GOP   Diabetes type I Sister        type 1   Diabetes Sister    Dementia Maternal Grandmother    AAA (abdominal aortic aneurysm) Maternal Grandfather    Hypertension Maternal Grandfather    Heart disease Maternal Uncle     Social History:  reports that he quit smoking about 7 years ago. His smoking use included cigarettes. He started smoking about 33 years ago. He has a 25.9 pack-year smoking history. He has never used smokeless tobacco. He reports that he does not currently use alcohol. He reports that he does not use drugs.   Physical Exam: BP (!) 139/90   Pulse 92   Wt 221 lb 1.6 oz (100.3 kg)   BMI 29.57 kg/m   Constitutional:  Well nourished. Alert and oriented, No acute distress. HEENT: [place] AT, moist mucus membranes.  Trachea midline Cardiovascular: No clubbing, cyanosis, or edema. Respiratory: Normal respiratory effort, no increased work of breathing. Neurologic: Grossly intact, no focal deficits, moving all 4 extremities. Psychiatric: Normal mood and affect.  Laboratory Data: Lipid Panel     Component Value Date/Time   CHOL 99 07/09/2022 1013   CHOL 160 04/06/2021 0832   TRIG 90 07/09/2022 1013   HDL 28 (L) 07/09/2022 1013   HDL 26 (L) 04/06/2021 0832   CHOLHDL 3.5 07/09/2022 1013    VLDL 35 (H) 07/09/2016 1016   LDLCALC 54 07/09/2022 1013   LABVLDL 29 04/06/2021 0832    CMP     Component Value Date/Time   NA 138 07/09/2022 1013   NA 139 04/06/2021 0832   K 5.0 07/09/2022 1013   CL 102 07/09/2022 1013   CO2 29 07/09/2022 1013   GLUCOSE 77 07/09/2022 1013   BUN 23 07/09/2022 1013   BUN 15 04/06/2021 0832   CREATININE 0.92 07/09/2022 1013   CALCIUM 9.5 07/09/2022 1013   PROT 6.8 07/09/2022 1013   PROT 7.9 01/01/2021 0914   ALBUMIN 5.0 01/01/2021 0914   AST 45 (H) 07/09/2022 1013   ALT 36 07/09/2022 1013   ALKPHOS 74 01/01/2021 0914   BILITOT 2.1 (H) 07/09/2022 1013   BILITOT 1.2 01/01/2021 0914   EGFR 103 07/09/2022 1013   EGFR 96 04/06/2021 0832   GFRNONAA 97 04/29/2020 1056    ontains abnormal data Hemoglobin A1C Order: [id number] Component Ref Range & Units 4 mo ago Hemoglobin A1C 4.2 - 5.6 % 7.1 High  Average Blood Glucose (Calc) mg/dL 409 Resulting Agency [facility] Narrative Performed by [facility] Normal Range:    4.2 - 5.6% Increased Risk:  5.7 - 6.4% Diabetes:        >= 6.5% Glycemic Control for adults with diabetes:  <7%    Specimen Collected: 06/10/22 10:08 Performed by: [facility] Last Resulted: 06/10/22 12:07 Received From: [facility]  Result Received: 06/25/22 11:16 I have reviewed the labs.    Pertinent Imaging: N/A  Assessment & Plan:    1. ED -At goal -Continue tadalafil 5 mg daily augmenting with tadalafil 20 mg on-demand dosing  2. Hypogonadism -managed through [facility] clinic   Return in about 1 year (around 10/19/2023) for SHIM .  Cloretta Ned   [facility] [address], [address] [place], [place] [address] [phone]

## 2022-10-19 ENCOUNTER — Encounter: Payer: Self-pay | Admitting: Urology

## 2022-10-19 ENCOUNTER — Ambulatory Visit (INDEPENDENT_AMBULATORY_CARE_PROVIDER_SITE_OTHER): Payer: BC Managed Care – PPO | Admitting: Urology

## 2022-10-19 VITALS — BP 139/90 | HR 92 | Wt 221.1 lb

## 2022-10-19 DIAGNOSIS — N5201 Erectile dysfunction due to arterial insufficiency: Secondary | ICD-10-CM | POA: Diagnosis not present

## 2022-10-28 ENCOUNTER — Emergency Department: Payer: BC Managed Care – PPO

## 2022-10-28 ENCOUNTER — Emergency Department
Admission: EM | Admit: 2022-10-28 | Discharge: 2022-10-28 | Disposition: A | Discharge status: Home / Self Care | Payer: BC Managed Care – PPO | Attending: Emergency Medicine | Admitting: Emergency Medicine

## 2022-10-28 ENCOUNTER — Other Ambulatory Visit: Payer: Self-pay

## 2022-10-28 DIAGNOSIS — Z79899 Other long term (current) drug therapy: Secondary | ICD-10-CM | POA: Diagnosis not present

## 2022-10-28 DIAGNOSIS — X509XXA Other and unspecified overexertion or strenuous movements or postures, initial encounter: Secondary | ICD-10-CM | POA: Insufficient documentation

## 2022-10-28 DIAGNOSIS — Z7982 Long term (current) use of aspirin: Secondary | ICD-10-CM | POA: Diagnosis not present

## 2022-10-28 DIAGNOSIS — S8392XA Sprain of unspecified site of left knee, initial encounter: Secondary | ICD-10-CM | POA: Diagnosis not present

## 2022-10-28 DIAGNOSIS — Y990 Civilian activity done for income or pay: Secondary | ICD-10-CM | POA: Diagnosis not present

## 2022-10-28 DIAGNOSIS — Z794 Long term (current) use of insulin: Secondary | ICD-10-CM | POA: Diagnosis not present

## 2022-10-28 DIAGNOSIS — I2510 Atherosclerotic heart disease of native coronary artery without angina pectoris: Secondary | ICD-10-CM | POA: Insufficient documentation

## 2022-10-28 DIAGNOSIS — N189 Chronic kidney disease, unspecified: Secondary | ICD-10-CM | POA: Diagnosis not present

## 2022-10-28 DIAGNOSIS — Z7984 Long term (current) use of oral hypoglycemic drugs: Secondary | ICD-10-CM | POA: Diagnosis not present

## 2022-10-28 DIAGNOSIS — E1122 Type 2 diabetes mellitus with diabetic chronic kidney disease: Secondary | ICD-10-CM | POA: Diagnosis not present

## 2022-10-28 DIAGNOSIS — M25562 Pain in left knee: Secondary | ICD-10-CM | POA: Diagnosis present

## 2022-10-28 DIAGNOSIS — M25462 Effusion, left knee: Secondary | ICD-10-CM

## 2022-10-28 DIAGNOSIS — I129 Hypertensive chronic kidney disease with stage 1 through stage 4 chronic kidney disease, or unspecified chronic kidney disease: Secondary | ICD-10-CM | POA: Insufficient documentation

## 2022-10-28 MED ORDER — HYDROCODONE-ACETAMINOPHEN 5-325 MG PO TABS: 1.0000 | ORAL_TABLET | Freq: Four times a day (QID) | ORAL | 0 refills | Status: AC | PRN

## 2022-10-28 MED ORDER — HYDROCODONE-ACETAMINOPHEN 5-325 MG PO TABS
1.0000 | ORAL_TABLET | Freq: Once | ORAL | Status: AC
  Administered 2022-10-28: 1 via ORAL
  Filled 2022-10-28: qty 1

## 2022-10-28 MED ORDER — KETOROLAC TROMETHAMINE 60 MG/2ML IM SOLN
30.0000 mg | Freq: Once | INTRAMUSCULAR | Status: AC
  Administered 2022-10-28: 30 mg via INTRAMUSCULAR
  Filled 2022-10-28: qty 2

## 2022-10-28 NOTE — ED Notes (Signed)
Applied ace bandage to left knee. Provided pt with height appropriate pair of crutches and provided instruction on proper use.

## 2022-10-28 NOTE — ED Notes (Signed)
Pt employed with [employer]; Pt & supervisor unsure of workers comp drug screening requirements and st will f/u with employer tomorrow regarding such

## 2022-10-28 NOTE — Discharge Instructions (Signed)
You may take Ibuprofen as needed for pain, Norco as needed for more severe pain.  Elevate affected area and apply ice several times daily to reduce swelling.  You may use Ace wrap and crutches as needed to help you balance as you walk.  Return to the ER for worsening symptoms or other concerns.

## 2022-10-28 NOTE — ED Provider Notes (Signed)
[facility] Provider Note    Event Date/Time   First MD Initiated Contact with Patient 10/28/22 0254     (approximate)   History   Knee Pain   HPI  Nathan Rojas is a 47 y.o. male who presents to the ED from work with a chief complaint of left knee pain/injury.  Patient works for [employer], stepped out of the truck last night around 10:45 PM when his left knee gave out and he heard a "pop".  Fell onto his buttock.  Presents with pain and swelling to his left knee.  Voices no other complaints or injuries.     Past Medical History   Past Medical History:  Diagnosis Date   Chronic pain of multiple joints 01/23/2016   CKD (chronic kidney disease)    Coronary artery disease, non-occlusive 01/09/2012   Coronary CT Angiogram: Coronary Calcium Score 92.  CAD RADS 2.  Mild-nonobstructive disease in the LAD (25-49%).  Minimal (<25%) disease in RCA and LCx.  Right dominant system normal origins.   CRP elevated 01/23/2016   Flexor tenosynovitis of thumb 05/27/2016   Hepatic steatosis 12/2020   noted on chest CT.   Hyperlipidemia    Hypertension    Kidney stone 11/22/2014   MRSA infection around age 40   left hand wound   Rheumatoid arthritis, 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HCC) 01/23/2016   Trigeminal neuralgia 06/03/2015   Type 2 diabetes mellitus, uncontrolled    Vitamin D deficiency 07/09/2016     Active Problem List   Patient Active Problem List   Diagnosis Date Noted   Low testosterone 07/09/2022   Pulmonary nodules 01/29/2021   Hypertension associated with type 2 diabetes mellitus (HCC) 05/01/2018   Type 2 diabetes mellitus with hyperglycemia, with long-term current use of insulin (HCC) 05/01/2018   Erectile dysfunction 04/27/2017   Rheumatoid arthritis, 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HCC) 01/23/2016   Obesity 04/18/2015   Abdominal aortic atherosclerosis (HCC) 11/16/2014   Hyperlipidemia associated with type 2 diabetes mellitus (HCC)    Coronary artery disease,  non-occlusive 01/09/2012     Past Surgical History   Past Surgical History:  Procedure Laterality Date   APPENDECTOMY     CHOLECYSTECTOMY N/A 05/06/2017   Procedure: LAPAROSCOPIC CHOLECYSTECTOMY;  Surgeon: Ancil Linsey, MD;  Location: [facility] ORS;  Service: General;  Laterality: N/A;   COLONOSCOPY WITH PROPOFOL N/A 06/13/2020   Procedure: COLONOSCOPY WITH PROPOFOL;  Surgeon: Pasty Spillers, MD;  Location: [facility] ENDOSCOPY;  Service: Endoscopy;  Laterality: N/A;   TONSILLECTOMY     TONSILLECTOMY     VASECTOMY  03/2013     Home Medications   Prior to Admission medications   Medication Sig Start Date End Date Taking? Authorizing Provider  HYDROcodone-acetaminophen (NORCO) 5-325 MG tablet Take 1 tablet by mouth every 6 (six) hours as needed for moderate pain. 10/28/22  Yes Irean Hong, MD  anastrozole (ARIMIDEX) 1 MG tablet Take 1 mg by mouth once a week. 09/14/21   [provider]  aspirin EC 81 MG tablet Take 81 mg by mouth daily.    [provider]  atorvastatin (LIPITOR) 40 MG tablet Take 1 tablet (40 mg total) by mouth daily. 01/01/22   Danelle Berry, PA-C  cholecalciferol (VITAMIN D) 1000 units tablet Take 1 tablet (1,000 Units total) by mouth daily. 01/04/18   Kerman Passey, MD  Continuous Blood Gluc Sensor (DEXCOM G7 SENSOR) MISC USE 1 EACH EVERY 10 (TEN) DAYS 06/28/22   [provider]  empagliflozin (JARDIANCE) 25  MG TABS tablet Take 1 tablet (25 mg total) by mouth daily. 12/31/21   Danelle Berry, PA-C  lisinopril (ZESTRIL) 10 MG tablet Take 1 tablet (10 mg total) by mouth daily. 07/13/22   Danelle Berry, PA-C  Multiple Vitamin (MULTIVITAMIN WITH MINERALS) TABS tablet Take 1 tablet by mouth daily.    [provider]  Semaglutide, 2 MG/DOSE, (OZEMPIC, 2 MG/DOSE,) 8 MG/3ML SOPN Inject 2 mg into the skin once a week.    Sherlon Handing, MD  tadalafil (CIALIS) 20 MG tablet Take 1 tablet (20 mg total) by mouth daily as needed for erectile  dysfunction. 09/22/21   Michiel Cowboy A, PA-C  tadalafil (CIALIS) 5 MG tablet Take 1 tablet (5 mg total) by mouth daily as needed for erectile dysfunction. 09/22/21   McGowan, Carollee Herter A, PA-C  TRESIBA FLEXTOUCH 200 UNIT/ML FlexTouch Pen SMARTSIG:60 Unit(s) SUB-Q Every Night 09/17/21   [provider]     Allergies  Patient has no known allergies.   Family History   Family History  Problem Relation Age of Onset   Diabetes type II Mother        resolved after GOP   Diabetes type I Sister        type 1   Diabetes Sister    Dementia Maternal Grandmother    AAA (abdominal aortic aneurysm) Maternal Grandfather    Hypertension Maternal Grandfather    Heart disease Maternal Uncle      Physical Exam  Triage Vital Signs: ED Triage Vitals  Encounter Vitals Group     BP 10/28/22 0030 (!) 178/107     Systolic BP Percentile --      Diastolic BP Percentile --      Pulse Rate 10/28/22 0030 84     Resp 10/28/22 0030 16     Temp 10/28/22 0030 98.1 F (36.7 C)     Temp Source 10/28/22 0030 Oral     SpO2 10/28/22 0030 99 %     Weight 10/28/22 0034 215 lb (97.5 kg)     Height 10/28/22 0034 6\' 1"  (1.854 m)     Head Circumference --      Peak Flow --      Pain Score 10/28/22 0034 10     Pain Loc --      Pain Education --      Exclude from Growth Chart --     Updated Vital Signs: BP (!) 178/107 (BP Location: Left Arm)   Pulse 84   Temp 98.1 F (36.7 C) (Oral)   Resp 16   Ht 6\' 1"  (1.854 m)   Wt 97.5 kg   SpO2 99%   BMI 28.37 kg/m    General: Awake, mild distress.  CV:  RRR.  Good peripheral perfusion.  Resp:  Normal effort.  CTAB. Abd:  No distention.  Other:  Left knee: Mild effusion noted.  Maximally tender to medial aspect.  Limited range of motion secondary to pain.  Calf is supple without tenderness.  2+ distal pulses.   ED Results / Procedures / Treatments  Labs (all labs ordered are listed, but only abnormal results are displayed) Labs Reviewed - No  data to display   EKG  None   RADIOLOGY I have independently visualized and interpreted patient's x-ray as well as noted the radiology interpretation:  Left knee x-rays: Negative  Official radiology report(s): DG Knee Complete 4 Views Left  Result Date: 10/28/2022 CLINICAL DATA:  Left-sided knee pain EXAM: LEFT KNEE - COMPLETE  4+ VIEW COMPARISON:  None Available. FINDINGS: No evidence of fracture, dislocation, or joint effusion. No evidence of arthropathy or other focal bone abnormality. Soft tissues are unremarkable. IMPRESSION: Negative. Electronically Signed   By: Jasmine Pang M.D.   On: 10/28/2022 01:04     PROCEDURES:  Critical Care performed: No  Procedures   MEDICATIONS ORDERED IN ED: Medications  ketorolac (TORADOL) injection 30 mg (30 mg Intramuscular Given 10/28/22 0303)  HYDROcodone-acetaminophen (NORCO/VICODIN) 5-325 MG per tablet 1 tablet (1 tablet Oral Given 10/28/22 0302)     IMPRESSION / MDM / ASSESSMENT AND PLAN / ED COURSE  I reviewed the triage vital signs and the nursing notes.                             47 year old male presenting with left knee pain and swelling.  X-rays negative for acute osseous injury.  Will place Ace wrap, provide crutches, administer IM ketorolac and Norco.  Will follow-up with pedis if needed.  Strict return precautions given.  Patient verbalizes understanding and agrees with plan of care.  Patient's presentation is most consistent with acute, uncomplicated illness.   FINAL CLINICAL IMPRESSION(S) / ED DIAGNOSES   Final diagnoses:  Effusion of left knee  Sprain of left knee, unspecified ligament, initial encounter     Rx / DC Orders   ED Discharge Orders          Ordered    HYDROcodone-acetaminophen (NORCO) 5-325 MG tablet  Every 6 hours PRN        10/28/22 0314             Note:  This document was prepared using Dragon voice recognition software and may include unintentional dictation errors.   Irean Hong,  MD 10/28/22 [phone]

## 2022-10-28 NOTE — ED Triage Notes (Signed)
Pt presents to ER with c/o left knee pain that happened around 2245 yesterday when he stepped off the step of the truck, and states he heard a "pop" from his left knee area.  Pt states pain is in the medial aspect of left knee and is very tender to touch.  Pt denies any other injuries, and is otherwise A&O x4 and in NAD.

## 2022-10-28 NOTE — ED Notes (Addendum)
Provided pt with paper RX, discharge instructions and education. All of pt questions answered. Pt in possession of all belongings. Pt AAOX4 and stable at time of discharge.

## 2022-10-28 NOTE — ED Notes (Signed)
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 Pt wheeled to work supervisor work vehicle safely with all belongings.

## 2022-11-04 ENCOUNTER — Encounter: Payer: Self-pay | Admitting: Gastroenterology

## 2022-11-05 ENCOUNTER — Ambulatory Visit
Admission: RE | Admit: 2022-11-05 | Discharge: 2022-11-05 | Disposition: A | Discharge status: Home / Self Care | Payer: BC Managed Care – PPO | Attending: Gastroenterology | Admitting: Gastroenterology

## 2022-11-05 ENCOUNTER — Ambulatory Visit: Payer: BC Managed Care – PPO | Admitting: Anesthesiology

## 2022-11-05 ENCOUNTER — Encounter
Admission: RE | Disposition: A | Discharge status: Home / Self Care | Payer: Self-pay | Source: Home / Self Care | Attending: Gastroenterology

## 2022-11-05 ENCOUNTER — Encounter: Payer: Self-pay | Admitting: Gastroenterology

## 2022-11-05 ENCOUNTER — Other Ambulatory Visit: Payer: Self-pay

## 2022-11-05 DIAGNOSIS — M059 Rheumatoid arthritis with rheumatoid factor, unspecified: Secondary | ICD-10-CM | POA: Insufficient documentation

## 2022-11-05 DIAGNOSIS — K640 First degree hemorrhoids: Secondary | ICD-10-CM | POA: Diagnosis not present

## 2022-11-05 DIAGNOSIS — Z794 Long term (current) use of insulin: Secondary | ICD-10-CM | POA: Diagnosis not present

## 2022-11-05 DIAGNOSIS — Z7985 Long-term (current) use of injectable non-insulin antidiabetic drugs: Secondary | ICD-10-CM | POA: Insufficient documentation

## 2022-11-05 DIAGNOSIS — D126 Benign neoplasm of colon, unspecified: Secondary | ICD-10-CM

## 2022-11-05 DIAGNOSIS — E785 Hyperlipidemia, unspecified: Secondary | ICD-10-CM | POA: Insufficient documentation

## 2022-11-05 DIAGNOSIS — K760 Fatty (change of) liver, not elsewhere classified: Secondary | ICD-10-CM | POA: Diagnosis not present

## 2022-11-05 DIAGNOSIS — I129 Hypertensive chronic kidney disease with stage 1 through stage 4 chronic kidney disease, or unspecified chronic kidney disease: Secondary | ICD-10-CM | POA: Insufficient documentation

## 2022-11-05 DIAGNOSIS — D122 Benign neoplasm of ascending colon: Secondary | ICD-10-CM | POA: Insufficient documentation

## 2022-11-05 DIAGNOSIS — D124 Benign neoplasm of descending colon: Secondary | ICD-10-CM | POA: Diagnosis not present

## 2022-11-05 DIAGNOSIS — Z79899 Other long term (current) drug therapy: Secondary | ICD-10-CM | POA: Diagnosis not present

## 2022-11-05 DIAGNOSIS — I2510 Atherosclerotic heart disease of native coronary artery without angina pectoris: Secondary | ICD-10-CM | POA: Diagnosis not present

## 2022-11-05 DIAGNOSIS — Z87891 Personal history of nicotine dependence: Secondary | ICD-10-CM | POA: Diagnosis not present

## 2022-11-05 DIAGNOSIS — Z7984 Long term (current) use of oral hypoglycemic drugs: Secondary | ICD-10-CM | POA: Insufficient documentation

## 2022-11-05 DIAGNOSIS — Z1211 Encounter for screening for malignant neoplasm of colon: Secondary | ICD-10-CM

## 2022-11-05 DIAGNOSIS — E1122 Type 2 diabetes mellitus with diabetic chronic kidney disease: Secondary | ICD-10-CM | POA: Insufficient documentation

## 2022-11-05 DIAGNOSIS — N189 Chronic kidney disease, unspecified: Secondary | ICD-10-CM | POA: Diagnosis not present

## 2022-11-05 HISTORY — PX: POLYPECTOMY: SHX5525

## 2022-11-05 HISTORY — PX: COLONOSCOPY WITH PROPOFOL: SHX5780

## 2022-11-05 LAB — GLUCOSE, CAPILLARY: Glucose-Capillary: 126 mg/dL — ABNORMAL HIGH (ref 70–99)

## 2022-11-05 SURGERY — COLONOSCOPY WITH PROPOFOL
Anesthesia: General

## 2022-11-05 MED ORDER — PROPOFOL 500 MG/50ML IV EMUL
INTRAVENOUS | Status: DC | PRN
  Administered 2022-11-05: 120 ug/kg/min via INTRAVENOUS

## 2022-11-05 MED ORDER — PROPOFOL 10 MG/ML IV BOLUS
INTRAVENOUS | Status: AC
  Filled 2022-11-05: qty 40

## 2022-11-05 MED ORDER — LIDOCAINE HCL (CARDIAC) PF 100 MG/5ML IV SOSY
PREFILLED_SYRINGE | INTRAVENOUS | Status: DC | PRN
  Administered 2022-11-05: 50 mg via INTRAVENOUS

## 2022-11-05 MED ORDER — SODIUM CHLORIDE 0.9 % IV SOLN: INTRAVENOUS | Status: DC

## 2022-11-05 MED ORDER — PROPOFOL 10 MG/ML IV BOLUS
INTRAVENOUS | Status: DC | PRN
  Administered 2022-11-05: 20 mg via INTRAVENOUS
  Administered 2022-11-05: 80 mg via INTRAVENOUS
  Administered 2022-11-05: 30 mg via INTRAVENOUS

## 2022-11-05 NOTE — Op Note (Signed)
[facility] Gastroenterology Patient Name: Nathan Rojas Procedure Date: 11/05/2022 8:15 AM MRN: [id number] Account #: [id number] Date of Birth: [birth year] Admit Type: Outpatient Age: 47 Room: [facility] ENDO ROOM 4 Gender: Male Note Status: Finalized Instrument Name: Prentice Docker [id number] Procedure:             Colonoscopy Indications:           Screening for colorectal malignant neoplasm Providers:             Wyline Mood MD, MD Medicines:             Monitored Anesthesia Care Complications:         No immediate complications. Procedure:             Pre-Anesthesia Assessment:                        - Prior to the procedure, a History and Physical was                         performed, and patient medications, allergies and                         sensitivities were reviewed. The patient's tolerance                         of previous anesthesia was reviewed.                        - The risks and benefits of the procedure and the                         sedation options and risks were discussed with the                         patient. All questions were answered and informed                         consent was obtained.                        - ASA Grade Assessment: II - A patient with mild                         systemic disease.                        After obtaining informed consent, the colonoscope was                         passed under direct vision. Throughout the procedure,                         the patient's blood pressure, pulse, and oxygen                         saturations were monitored continuously. The                         Colonoscope was introduced through the anus and  advanced to the the cecum, identified by the                         appendiceal orifice. The colonoscopy was performed                         with ease. The patient tolerated the procedure well.                         The quality of the bowel preparation  was good. The                         ileocecal valve, appendiceal orifice, and rectum were                         photographed. Findings:      The perianal and digital rectal examinations were normal.      Two sessile polyps were found in the descending colon and ascending       colon. The polyps were 5 to 6 mm in size. These polyps were removed with       a cold snare. Resection and retrieval were complete.      A 3 mm polyp was found in the ascending colon. The polyp was sessile.       The polyp was removed with a jumbo cold forceps. Resection and retrieval       were complete.      Non-bleeding internal hemorrhoids were found during retroflexion. The       hemorrhoids were large and Grade I (internal hemorrhoids that do not       prolapse).      The exam was otherwise without abnormality on direct and retroflexion       views. Impression:            - Two 5 to 6 mm polyps in the descending colon and in                         the ascending colon, removed with a cold snare.                         Resected and retrieved.                        - One 3 mm polyp in the ascending colon, removed with                         a jumbo cold forceps. Resected and retrieved.                        - Non-bleeding internal hemorrhoids.                        - The examination was otherwise normal on direct and                         retroflexion views. Recommendation:        - Discharge patient to home (with escort).                        -  Resume previous diet.                        - Continue present medications.                        - Await pathology results.                        - Repeat colonoscopy for surveillance based on                         pathology results. Procedure Code(s):     --- Professional ---                        [phone], Colonoscopy, flexible; with removal of                         tumor(s), polyp(s), or other lesion(s) by snare                          technique                        [id number], 59, Colonoscopy, flexible; with biopsy, single                         or multiple Diagnosis Code(s):     --- Professional ---                        Z12.11, Encounter for screening for malignant neoplasm                         of colon                        D12.4, Benign neoplasm of descending colon                        K64.0, First degree hemorrhoids                        D12.2, Benign neoplasm of ascending colon CPT copyright 2022 [employer]. All rights reserved. The codes documented in this report are preliminary and upon coder review may  be revised to meet current compliance requirements. Wyline Mood, MD Wyline Mood MD, MD 11/05/2022 8:39:41 AM This report has been signed electronically. Number of Addenda: 0 Note Initiated On: 11/05/2022 8:15 AM Scope Withdrawal Time: 0 hours 14 minutes 34 seconds  Total Procedure Duration: 0 hours 17 minutes 24 seconds  Estimated Blood Loss:  Estimated blood loss: none.      [facility]

## 2022-11-05 NOTE — Anesthesia Postprocedure Evaluation (Signed)
Anesthesia Post Note  Patient: Nathan Rojas  Procedure(s) Performed: COLONOSCOPY WITH PROPOFOL POLYPECTOMY  Patient location during evaluation: Endoscopy Anesthesia Type: General Level of consciousness: awake and alert Pain management: pain level controlled Vital Signs Assessment: post-procedure vital signs reviewed and stable Respiratory status: spontaneous breathing, nonlabored ventilation, respiratory function stable and patient connected to nasal cannula oxygen Cardiovascular status: blood pressure returned to baseline and stable Postop Assessment: no apparent nausea or vomiting Anesthetic complications: no   No notable events documented.   Last Vitals:  Vitals:   11/05/22 0735 11/05/22 0840  BP: (!) 143/97 124/78  Pulse: 83 90  Resp: 16 16  Temp: (!) 36.1 C   SpO2: 97% 97%    Last Pain:  Vitals:   11/05/22 0840  TempSrc:   PainSc: 0-No pain                 Louie Boston

## 2022-11-05 NOTE — H&P (Signed)
Wyline Mood, MD [address], [address], [place], [place], [address] [address], [place], [place], [address] Phone: [phone]  Fax: [phone]  Primary Care Physician:  Danelle Berry, PA-C   Pre-Procedure History & Physical: HPI:  Nathan Rojas is a 47 y.o. male is here for an colonoscopy.   Past Medical History:  Diagnosis Date   Chronic pain of multiple joints 01/23/2016   CKD (chronic kidney disease)    Coronary artery disease, non-occlusive 01/09/2012   Coronary CT Angiogram: Coronary Calcium Score 92.  CAD RADS 2.  Mild-nonobstructive disease in the LAD (25-49%).  Minimal (<25%) disease in RCA and LCx.  Right dominant system normal origins.   CRP elevated 01/23/2016   Flexor tenosynovitis of thumb 05/27/2016   Hepatic steatosis 12/2020   noted on chest CT.   Hyperlipidemia    Hypertension    Kidney stone 11/22/2014   MRSA infection around age 45   left hand wound   Rheumatoid arthritis, 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HCC) 01/23/2016   Trigeminal neuralgia 06/03/2015   Type 2 diabetes mellitus, uncontrolled    Vitamin D deficiency 07/09/2016    Past Surgical History:  Procedure Laterality Date   APPENDECTOMY     CHOLECYSTECTOMY N/A 05/06/2017   Procedure: LAPAROSCOPIC CHOLECYSTECTOMY;  Surgeon: Ancil Linsey, MD;  Location: [facility] ORS;  Service: General;  Laterality: N/A;   COLONOSCOPY WITH PROPOFOL N/A 06/13/2020   Procedure: COLONOSCOPY WITH PROPOFOL;  Surgeon: Pasty Spillers, MD;  Location: [facility] END[facility];  Service: Endoscopy;  Laterality: N/A;   TONSILLECTOMY     TONSILLECTOMY     VASECTOMY  03/2013    Prior to Admission medications   Medication Sig Start Date End Date Taking? Authorizing Provider  aspirin EC 81 MG tablet Take 81 mg by mouth daily.   Yes [provider]  atorvastatin (LIPITOR) 40 MG tablet Take 1 tablet (40 mg total) by mouth daily. 01/01/22  Yes Danelle Berry, PA-C  cholecalciferol (VITAMIN D) 1000 units tablet Take 1  tablet (1,000 Units total) by mouth daily. 01/04/18  Yes Lada, Janit Bern, MD  Multiple Vitamin (MULTIVITAMIN WITH MINERALS) TABS tablet Take 1 tablet by mouth daily.   Yes [provider]  anastrozole (ARIMIDEX) 1 MG tablet Take 1 mg by mouth once a week. 09/14/21   [provider]  Continuous Blood Gluc Sensor (DEXCOM G7 SENSOR) MISC USE 1 EACH EVERY 10 (TEN) DAYS 06/28/22   [provider]  empagliflozin (JARDIANCE) 25 MG TABS tablet Take 1 tablet (25 mg total) by mouth daily. 12/31/21   Danelle Berry, PA-C  HYDROcodone-acetaminophen (NORCO) 5-325 MG tablet Take 1 tablet by mouth every 6 (six) hours as needed for moderate pain. 10/28/22   Irean Hong, MD  lisinopril (ZESTRIL) 10 MG tablet Take 1 tablet (10 mg total) by mouth daily. 07/13/22   Danelle Berry, PA-C  Semaglutide, 2 MG/DOSE, (OZEMPIC, 2 MG/DOSE,) 8 MG/3ML SOPN Inject 2 mg into the skin once a week.    Sherlon Handing, MD  tadalafil (CIALIS) 20 MG tablet Take 1 tablet (20 mg total) by mouth daily as needed for erectile dysfunction. 09/22/21   Michiel Cowboy A, PA-C  tadalafil (CIALIS) 5 MG tablet Take 1 tablet (5 mg total) by mouth daily as needed for erectile dysfunction. 09/22/21   McGowan, Wellington Hampshire, PA-C  TRESIBA FLEXTOUCH 200 UNIT/ML FlexTouch Pen SMARTSIG:60 Unit(s) SUB-Q Every Night 09/17/21   [provider]    Allergies as of 09/10/2022   (  No Known Allergies)    Family History  Problem Relation Age of Onset   Diabetes type II Mother        resolved after GOP   Diabetes type I Sister        type 1   Diabetes Sister    Dementia Maternal Grandmother    AAA (abdominal aortic aneurysm) Maternal Grandfather    Hypertension Maternal Grandfather    Heart disease Maternal Uncle     Social History   Socioeconomic History   Marital status: Married    Spouse name: Tammy   Number of children: 1   Years of education: 12   Highest education level: Associate degree: academic program   Occupational History   Occupation: cable maintenance    Employer: [employer]  Tobacco Use   Smoking status: Former    Current packs/day: 0.00    Average packs/day: 1 pack/day for 25.9 years (25.9 ttl pk-yrs)    Types: Cigarettes    Start date: 03/29/1989    Quit date: 02/08/2015    Years since quitting: 7.7   Smokeless tobacco: Never  Vaping Use   Vaping status: Never Used  Substance and Sexual Activity   Alcohol use: Not Currently   Drug use: No   Sexual activity: Yes    Partners: Female  Other Topics Concern   Not on file  Social History Narrative   Not on file   Social Determinants of Health   Financial Resource Strain: Low Risk  (07/09/2022)   Overall Financial Resource Strain (CARDIA)    Difficulty of Paying Living Expenses: Not hard at all  Food Insecurity: No Food Insecurity (07/09/2022)   Hunger Vital Sign    Worried About Running Out of Food in the Last Year: Never true    Ran Out of Food in the Last Year: Never true  Transportation Needs: No Transportation Needs (07/09/2022)   PRAPARE - Administrator, Civil Service (Medical): No    Lack of Transportation (Non-Medical): No  Physical Activity: Sufficiently Active (07/09/2022)   Exercise Vital Sign    Days of Exercise per Week: 4 days    Minutes of Exercise per Session: 50 min  Stress: No Stress Concern Present (07/09/2022)   [employer] of Occupational Health - Occupational Stress Questionnaire    Feeling of Stress : Not at all  Social Connections: Moderately Isolated (07/09/2022)   Social Connection and Isolation Panel [NHANES]    Frequency of Communication with Friends and Family: More than three times a week    Frequency of Social Gatherings with Friends and Family: More than three times a week    Attends Religious Services: Never    Database administrator or Organizations: No    Attends Banker Meetings: Never    Marital Status: Married  Catering manager Violence: Not At Risk  (07/09/2022)   Humiliation, Afraid, Rape, and Kick questionnaire    Fear of Current or Ex-Partner: No    Emotionally Abused: No    Physically Abused: No    Sexually Abused: No    Review of Systems: See HPI, otherwise negative ROS  Physical Exam: BP (!) 143/97   Pulse 83   Temp (!) 96.9 F (36.1 C) (Temporal)   Resp 16   Wt 99.8 kg   SpO2 97%   BMI 29.03 kg/m  General:   Alert,  pleasant and cooperative in NAD Head:  Normocephalic and atraumatic. Neck:  Supple; no masses or thyromegaly. Lungs:  Clear  throughout to auscultation, normal respiratory effort.    Heart:  +S1, +S2, Regular rate and rhythm, No edema. Abdomen:  Soft, nontender and nondistended. Normal bowel sounds, without guarding, and without rebound.   Neurologic:  Alert and  oriented x4;  grossly normal neurologically.  Impression/Plan: [name] is here for an colonoscopy to be performed for Screening colonoscopy average risk   Risks, benefits, limitations, and alternatives regarding  colonoscopy have been reviewed with the patient.  Questions have been answered.  All parties agreeable.   Wyline Mood, MD  11/05/2022, 7:51 AM

## 2022-11-05 NOTE — Anesthesia Preprocedure Evaluation (Addendum)
Anesthesia Evaluation  Patient identified by MRN, date of birth, ID band Patient awake    Reviewed: Allergy & Precautions, NPO status , Patient's Chart, lab work & pertinent test results  History of Anesthesia Complications Negative for: history of anesthetic complications  Airway Mallampati: III  TM Distance: >3 FB Neck ROM: full    Dental  (+) Missing   Pulmonary former smoker   Pulmonary exam normal        Cardiovascular hypertension, On Medications + CAD  Normal cardiovascular exam     Neuro/Psych  negative psych ROS   GI/Hepatic negative GI ROS, Neg liver ROS,,,  Endo/Other  diabetes, Type 2, Insulin Dependent, Oral Hypoglycemic Agents  GLP-1 use  Renal/GU Renal disease  negative genitourinary   Musculoskeletal   Abdominal   Peds  Hematology negative hematology ROS (+)   Anesthesia Other Findings Past Medical History: 01/23/2016: Chronic pain of multiple joints No date: CKD (chronic kidney disease) 01/09/2012: Coronary artery disease, non-occlusive     Comment:  Coronary CT Angiogram: Coronary Calcium Score 92.  CAD               RADS 2.  Mild-nonobstructive disease in the LAD (25-49%).              Minimal (<25%) disease in RCA and LCx.  Right dominant               system normal origins. 01/23/2016: CRP elevated 05/27/2016: Flexor tenosynovitis of thumb 12/2020: Hepatic steatosis     Comment:  noted on chest CT. No date: Hyperlipidemia No date: Hypertension 11/22/2014: Kidney stone around age 39: MRSA infection     Comment:  left hand wound 01/23/2016: Rheumatoid arthritis, 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HCC) 06/03/2015: Trigeminal neuralgia No date: Type 2 diabetes mellitus, uncontrolled 07/09/2016: Vitamin D deficiency  Past Surgical History: No date: APPENDECTOMY 05/06/2017: CHOLECYSTECTOMY; N/A     Comment:  Procedure: LAPAROSCOPIC CHOLECYSTECTOMY;  Surgeon:               Ancil Linsey, MD;  Location:  [facility] OR[facility];  Service:               General;  Laterality: N/A; 06/13/2020: COLONOSCOPY WITH PROPOFOL; N/A     Comment:  Procedure: COLONOSCOPY WITH PROPOFOL;  Surgeon:               Pasty Spillers, MD;  Location: [facility];                Service: Endoscopy;  Laterality: N/A; No date: TONSILLECTOMY No date: TONSILLECTOMY 03/2013: VASECTOMY     Reproductive/Obstetrics negative OB ROS                             Anesthesia Physical Anesthesia Plan  ASA: 3  Anesthesia Plan: General   Post-op Pain Management: Minimal or no pain anticipated   Induction: Intravenous  PONV Risk Score and Plan: 1 and Propofol infusion and TIVA  Airway Management Planned: Natural Airway and Nasal Cannula  Additional Equipment:   Intra-op Plan:   Post-operative Plan:   Informed Consent: I have reviewed the patients History and Physical, chart, labs and discussed the procedure including the risks, benefits and alternatives for the proposed anesthesia with the patient or authorized representative who has indicated his/her understanding and acceptance.     Dental Advisory Given  Plan Discussed with: Anesthesiologist, CRNA and Surgeon  Anesthesia Plan Comments: (Patient consented for risks of anesthesia including  but not limited to:  - adverse reactions to medications - risk of airway placement if required - damage to eyes, teeth, lips or other oral mucosa - nerve damage due to positioning  - sore throat or hoarseness - Damage to heart, brain, nerves, lungs, other parts of body or loss of life  Patient voiced understanding.)       Anesthesia Quick Evaluation

## 2022-11-05 NOTE — Transfer of Care (Signed)
Immediate Anesthesia Transfer of Care Note  Patient: Nathan Rojas  Procedure(s) Performed: COLONOSCOPY WITH PROPOFOL POLYPECTOMY  Patient Location: PACU  Anesthesia Type:MAC  Level of Consciousness: awake  Airway & Oxygen Therapy: Patient Spontanous Breathing  Post-op Assessment: Report given to RN and Post -op Vital signs reviewed and stable  Post vital signs: Reviewed and stable  Last Vitals:  Vitals Value Taken Time  BP 124/78 11/05/22 0841  Temp    Pulse 85 11/05/22 0842  Resp 21 11/05/22 0842  SpO2 98 % 11/05/22 0842  Vitals shown include unfiled device data.  Last Pain:  Vitals:   11/05/22 0840  TempSrc:   PainSc: 0-No pain         Complications: No notable events documented.

## 2022-11-08 ENCOUNTER — Encounter: Payer: Self-pay | Admitting: Gastroenterology

## 2022-11-09 ENCOUNTER — Encounter: Payer: Self-pay | Admitting: Gastroenterology

## 2022-12-16 ENCOUNTER — Other Ambulatory Visit: Payer: Self-pay | Admitting: Urology

## 2022-12-16 DIAGNOSIS — N5201 Erectile dysfunction due to arterial insufficiency: Secondary | ICD-10-CM

## 2022-12-21 ENCOUNTER — Other Ambulatory Visit: Payer: Self-pay | Admitting: Surgery

## 2022-12-29 ENCOUNTER — Encounter
Admission: RE | Admit: 2022-12-29 | Discharge: 2022-12-29 | Disposition: A | Discharge status: Home / Self Care | Payer: BC Managed Care – PPO | Source: Ambulatory Visit | Attending: Surgery | Admitting: Surgery

## 2022-12-29 VITALS — Ht 73.0 in | Wt 214.9 lb

## 2022-12-29 DIAGNOSIS — Z01812 Encounter for preprocedural laboratory examination: Secondary | ICD-10-CM

## 2022-12-29 DIAGNOSIS — Z01810 Encounter for preprocedural cardiovascular examination: Secondary | ICD-10-CM

## 2022-12-29 DIAGNOSIS — I152 Hypertension secondary to endocrine disorders: Secondary | ICD-10-CM

## 2022-12-29 DIAGNOSIS — Z01818 Encounter for other preprocedural examination: Secondary | ICD-10-CM

## 2022-12-29 DIAGNOSIS — E1165 Type 2 diabetes mellitus with hyperglycemia: Secondary | ICD-10-CM

## 2022-12-29 HISTORY — DX: Male erectile dysfunction, unspecified: N52.9

## 2022-12-29 HISTORY — DX: Atherosclerosis of aorta: I70.0

## 2022-12-29 HISTORY — DX: Other specified abnormal findings of blood chemistry: R79.89

## 2022-12-29 HISTORY — DX: Long term (current) use of unspecified immunomodulators and immunosuppressants: Z79.60

## 2022-12-29 HISTORY — DX: Polyp of colon: K63.5

## 2022-12-29 HISTORY — DX: Other nonspecific abnormal finding of lung field: R91.8

## 2022-12-29 HISTORY — DX: Personal history of nicotine dependence: Z87.891

## 2022-12-29 HISTORY — DX: Personal history of urinary calculi: Z87.442

## 2022-12-29 NOTE — Patient Instructions (Addendum)
Your procedure is scheduled on:01-06-23 Thursday Report to the Registration Desk on the 1st floor of the [facility].Then proceed to the 2nd floor Surgery Desk To find out your arrival time, please call [phone] between 1PM - 3PM on:01-05-23 Wednesday If your arrival time is 6:00 am, do not arrive before that time as the [facility] entrance doors do not open until 6:00 am.  REMEMBER: Instructions that are not followed completely may result in serious medical risk, up to and including death; or upon the discretion of your surgeon and anesthesiologist your surgery may need to be rescheduled.  Do not eat food after midnight the night before surgery.  No gum chewing or hard candies.  You may however, drink Water up to 2 hours before you are scheduled to arrive for your surgery. Do not drink anything within 2 hours of your scheduled arrival time.  In addition, your doctor has ordered for you to drink the provided:  Gatorade G2 Drinking this carbohydrate drink up to two hours before surgery helps to reduce insulin resistance and improve patient outcomes. Please complete drinking 2 hours before scheduled arrival time.  One week prior to surgery: Stop Anti-inflammatories (NSAIDS) such as Advil, Aleve, Ibuprofen, Motrin, Naproxen, Naprosyn and Aspirin based products such as Excedrin, Goody's Powder, BC Powder.You may however, take Tylenol/Hydrocodone if needed for pain up until the day of surgery. Stop ANY OVER THE COUNTER supplements/vitamins NOW (12-29-22) until after surgery (Vitamin C, D and multivitamin)   Continue taking all prescribed medications with the exception of the following: -Ozempic-Stop 7 days prior to surgery-Last dose was on  12-18-22-Do NOT take again until AFTER surgery -empagliflozin (JARDIANCE)-Stop 3 days prior to surgery-Last dose will be on 01-02-23 Sunday -tadalafil (CIALIS)-Stop 2 days prior to surgery-Last dose will be on 01-03-23 Monday  TAKE ONLY THESE MEDICATIONS THE MORNING OF SURGERY WITH A SIP OF WATER: -atorvastatin (LIPITOR)   Continue your 81 mg Aspirin up until the day prior to surgery-Do NOT take the morning of surgery  Do NOT take any Insulin the morning of surgery  No Alcohol for 24 hours before or after surgery.  No Smoking including e-cigarettes for 24 hours before surgery.  No chewable tobacco products for at least 6 hours before surgery.  No nicotine patches on the day of surgery.  Do not use any "recreational" drugs for at least a week (preferably 2 weeks) before your surgery.  Please be advised that the combination of cocaine and anesthesia may have negative outcomes, up to and including death. If you test positive for cocaine, your surgery will be cancelled.  On the morning of surgery brush your teeth with toothpaste and water, you may rinse your mouth with mouthwash if you wish. Do not swallow any toothpaste or mouthwash.  Use CHG Soap as directed on instruction sheet.  Do not wear jewelry, make-up, hairpins, clips or nail polish.  For welded (permanent) jewelry: bracelets, anklets, waist bands, etc.  Please have this removed prior to surgery.  If it is not removed, there is a chance that hospital personnel will need to cut it off on the day of surgery.  Do not wear lotions, powders, or perfumes.   Do not shave body hair from the neck down 48 hours before surgery.  Contact lenses, hearing aids and dentures may not be worn into surgery.  Do not bring valuables to the hospital. [facility] is not responsible for any missing/lost belongings or valuables.   Notify your doctor if there is  any change in your medical condition (cold, fever, infection).  Wear comfortable  clothing (specific to your surgery type) to the hospital.  After surgery, you can help prevent lung complications by doing breathing exercises.  Take deep breaths and cough every 1-2 hours. Your doctor may order a device called an Incentive Spirometer to help you take deep breaths. When coughing or sneezing, hold a pillow firmly against your incision with both hands. This is called "splinting." Doing this helps protect your incision. It also decreases belly discomfort.  If you are being admitted to the hospital overnight, leave your suitcase in the car. After surgery it may be brought to your room.  In case of increased patient census, it may be necessary for you, the patient, to continue your postoperative care in the Same Day Surgery department.  If you are being discharged the day of surgery, you will not be allowed to drive home. You will need a responsible individual to drive you home and stay with you for 24 hours after surgery.   If you are taking public transportation, you will need to have a responsible individual with you.  Please call the Pre-admissions Testing Dept. at [phone] if you have any questions about these instructions.  Surgery Visitation Policy:  Patients having surgery or a procedure may have two visitors.  Children under the age of 62 must have an adult with them who is not the patient.     Preparing for Surgery with CHLORHEXIDINE GLUCONATE (CHG) Soap  Chlorhexidine Gluconate (CHG) Soap  o An antiseptic cleaner that kills germs and bonds with the skin to continue killing germs even after washing  o Used for showering the night before surgery and morning of surgery  Before surgery, you can play an important role by reducing the number of germs on your skin.  CHG (Chlorhexidine gluconate) soap is an antiseptic cleanser which kills germs and bonds with the skin to continue killing germs even after washing.  Please do not use if you have an allergy to  CHG or antibacterial soaps. If your skin becomes reddened/irritated stop using the CHG.  1. Shower the NIGHT BEFORE SURGERY and the MORNING OF SURGERY with CHG soap.  2. If you choose to wash your hair, wash your hair first as usual with your normal shampoo.  3. After shampooing, rinse your hair and body thoroughly to remove the shampoo.  4. Use CHG as you would any other liquid soap. You can apply CHG directly to the skin and wash gently with a scrungie or a clean washcloth.  5. Apply the CHG soap to your body only from the neck down. Do not use on open wounds or open sores. Avoid contact with your eyes, ears, mouth, and genitals (private parts). Wash face and genitals (private parts) with your normal soap.  6. Wash thoroughly, paying special attention to the area where your surgery will be performed.  7. Thoroughly rinse your body with warm water.  8. Do not shower/wash with your normal soap after using and rinsing off the CHG soap.  9. Pat yourself dry with a clean towel.  10. Wear clean pajamas to bed the night before surgery.  12. Place clean sheets on your bed the night of your first shower and do not sleep with pets.  13. Shower again with the CHG soap on the day of surgery prior to arriving at the hospital.  14. Do not apply any deodorants/lotions/powders.  15. Please wear clean clothes to the  hospital.  How to Use an Incentive Spirometer An incentive spirometer is a tool that measures how well you are filling your lungs with each breath. Learning to take long, deep breaths using this tool can help you keep your lungs clear and active. This may help to reverse or lessen your chance of developing breathing (pulmonary) problems, especially infection. You may be asked to use a spirometer: After a surgery. If you have a lung problem or a history of smoking. After a long period of time when you have been unable to move or be active. If the spirometer includes an indicator to  show the highest number that you have reached, your health care provider or respiratory therapist will help you set a goal. Keep a log of your progress as told by your health care provider. What are the risks? Breathing too quickly may cause dizziness or cause you to pass out. Take your time so you do not get dizzy or light-headed. If you are in pain, you may need to take pain medicine before doing incentive spirometry. It is harder to take a deep breath if you are having pain. How to use your incentive spirometer  Sit up on the edge of your bed or on a chair. Hold the incentive spirometer so that it is in an upright position. Before you use the spirometer, breathe out normally. Place the mouthpiece in your mouth. Make sure your lips are closed tightly around it. Breathe in slowly and as deeply as you can through your mouth, causing the piston or the ball to rise toward the top of the chamber. Hold your breath for 3-5 seconds, or for as long as possible. If the spirometer includes a coach indicator, use this to guide you in breathing. Slow down your breathing if the indicator goes above the marked areas. Remove the mouthpiece from your mouth and breathe out normally. The piston or ball will return to the bottom of the chamber. Rest for a few seconds, then repeat the steps 10 or more times. Take your time and take a few normal breaths between deep breaths so that you do not get dizzy or light-headed. Do this every 1-2 hours when you are awake. If the spirometer includes a goal marker to show the highest number you have reached (best effort), use this as a goal to work toward during each repetition. After each set of 10 deep breaths, cough a few times. This will help to make sure that your lungs are clear. If you have an incision on your chest or abdomen from surgery, place a pillow or a rolled-up towel firmly against the incision when you cough. This can help to reduce pain while taking deep  breaths and coughing. General tips When you are able to get out of bed: Walk around often. Continue to take deep breaths and cough in order to clear your lungs. Keep using the incentive spirometer until your health care provider says it is okay to stop using it. If you have been in the hospital, you may be told to keep using the spirometer at home. Contact a health care provider if: You are having difficulty using the spirometer. You have trouble using the spirometer as often as instructed. Your pain medicine is not giving enough relief for you to use the spirometer as told. You have a fever. Get help right away if: You develop shortness of breath. You develop a cough with bloody mucus from the lungs. You have fluid or blood coming  from an incision site after you cough. Summary An incentive spirometer is a tool that can help you learn to take long, deep breaths to keep your lungs clear and active. You may be asked to use a spirometer after a surgery, if you have a lung problem or a history of smoking, or if you have been inactive for a long period of time. Use your incentive spirometer as instructed every 1-2 hours while you are awake. If you have an incision on your chest or abdomen, place a pillow or a rolled-up towel firmly against your incision when you cough. This will help to reduce pain. Get help right away if you have shortness of breath, you cough up bloody mucus, or blood comes from your incision when you cough. This information is not intended to replace advice given to you by your health care provider. Make sure you discuss any questions you have with your health care provider. Document Revised: 06/04/2019 Document Reviewed: 06/04/2019 Elsevier Patient Education  2024 [employer].

## 2022-12-31 ENCOUNTER — Encounter
Admission: RE | Admit: 2022-12-31 | Discharge: 2022-12-31 | Disposition: A | Discharge status: Home / Self Care | Payer: BC Managed Care – PPO | Source: Ambulatory Visit | Attending: Surgery | Admitting: Surgery

## 2022-12-31 DIAGNOSIS — R9431 Abnormal electrocardiogram [ECG] [EKG]: Secondary | ICD-10-CM | POA: Insufficient documentation

## 2022-12-31 DIAGNOSIS — Z794 Long term (current) use of insulin: Secondary | ICD-10-CM | POA: Insufficient documentation

## 2022-12-31 DIAGNOSIS — E1165 Type 2 diabetes mellitus with hyperglycemia: Secondary | ICD-10-CM | POA: Diagnosis not present

## 2022-12-31 DIAGNOSIS — I152 Hypertension secondary to endocrine disorders: Secondary | ICD-10-CM | POA: Diagnosis not present

## 2022-12-31 DIAGNOSIS — E1159 Type 2 diabetes mellitus with other circulatory complications: Secondary | ICD-10-CM | POA: Insufficient documentation

## 2022-12-31 DIAGNOSIS — Z01812 Encounter for preprocedural laboratory examination: Secondary | ICD-10-CM | POA: Insufficient documentation

## 2022-12-31 DIAGNOSIS — Z01810 Encounter for preprocedural cardiovascular examination: Secondary | ICD-10-CM | POA: Insufficient documentation

## 2022-12-31 DIAGNOSIS — Z01818 Encounter for other preprocedural examination: Secondary | ICD-10-CM | POA: Diagnosis present

## 2022-12-31 LAB — HEMOGLOBIN A1C
Hgb A1c MFr Bld: 6.2 % — ABNORMAL HIGH (ref 4.8–5.6)
Mean Plasma Glucose: 131.24 mg/dL

## 2022-12-31 NOTE — Progress Notes (Signed)
  Perioperative Services Pre-Admission/Anesthesia Testing    Date: 12/31/22  Name: Nathan Rojas MRN:   [id number]  Re: GLP-1 clearance and provider recommendations   Planned Surgical Procedure(s):    Case: [id number] Date/Time: 01/06/23 1478   Procedure: CARPAL TUNNEL RELEASE ENDOSCOPIC (Bilateral: Wrist)   Anesthesia type: Choice   Pre-op diagnosis: BILATERAL CAPAL TUNNEL SYNDROME.   Location: [facility] OR ROOM 02 / [facility]   Surgeons: Christena Flake, MD      Clinical Notes:  Patient is scheduled for the above procedure with the indicated provider/surgeon. In review of his medication reconciliation it was noted that patient is on a prescribed GLP-1 medication. Per guidelines issued by the American Society of Anesthesiologists (ASA), it is recommended that these medications be held for 7 days prior to the patient undergoing any type of elective surgical procedure. The patient is taking the following GLP-1 medication:  [x]  SEMAGLUTIDE   []  EXENATIDE  []  LIRAGLUTIDE   []  LIXISENATIDE  []  DULAGLUTIDE     []  TIRZEPATIDE (GLP-1/GIP)  Reached out to prescribing provider Tedd Sias, MD) to make them aware of the guidelines from anesthesia. Given that this patient takes the prescribed GLP-1 medication for his  diabetes diagnosis, rather than for weight loss, recommendations from the prescribing provider were solicited. Prescribing provider made aware of the following so that informed decision/POC can be developed for this patient that may be taking medications belonging to these drug classes:  Oral GLP-1 medications will be held 1 day prior to surgery.  Injectable GLP-1 medications will be held 7 days prior to surgery.  Metformin is routinely held 48 hours prior to surgery due to renal concerns, potential need for contrasted imaging perioperatively, and the potential for tissue hypoxia leading to drug induced lactic acidosis.  All SGLT2i medications are held 72 hours prior  to surgery as they can be associated with the increased potential for developing euglycemic diabetic ketoacidosis (EDKA).   Impression and Plan:  [name] is on a prescribed GLP-1 medication, which induces the known side effect of decreased gastric emptying. Efforts are bring made to mitigate the risk of perioperative hyperglycemic events, as elevated blood glucose levels have been found to contribute to intra/postoperative complications. Additionally, hyperglycemic extremes can potentially necessitate the postponing of a patient's elective case in order to better optimize perioperative glycemic control, again with the aforementioned guidelines in place. With this in mind, recommendations have been sought from the prescribing provider, who has cleared patient to proceed with holding the prescribed GLP-1 as per the guidelines from the ASA.   Provider recommending: no further recommendations received from the prescribing provider.  Copy of signed clearance and recommendations placed on patient's chart for inclusion in their medical record and for review by the surgical/anesthetic team on the day of his procedure.   Quentin Mulling, MSN, APRN, FNP-C, CEN [facility]  Perioperative Services Nurse Practitioner Phone: [phone] Fax: [phone] 12/31/22 2:11 PM  NOTE: This note has been prepared using Dragon dictation software. Despite my best ability to proofread, there is always the potential that unintentional transcriptional errors may still occur from this process.

## 2023-01-05 MED ORDER — CHLORHEXIDINE GLUCONATE 0.12 % MT SOLN
15.0000 mL | Freq: Once | OROMUCOSAL | Status: AC
  Administered 2023-01-06: 15 mL via OROMUCOSAL

## 2023-01-05 MED ORDER — ORAL CARE MOUTH RINSE: 15.0000 mL | Freq: Once | OROMUCOSAL | Status: AC

## 2023-01-05 MED ORDER — CEFAZOLIN SODIUM-DEXTROSE 2-4 GM/100ML-% IV SOLN
2.0000 g | INTRAVENOUS | Status: AC
  Administered 2023-01-06: 2 g via INTRAVENOUS

## 2023-01-05 MED ORDER — FAMOTIDINE 20 MG PO TABS
20.0000 mg | ORAL_TABLET | Freq: Once | ORAL | Status: AC
  Administered 2023-01-06: 20 mg via ORAL

## 2023-01-05 MED ORDER — SODIUM CHLORIDE 0.9 % IV SOLN: INTRAVENOUS | Status: DC

## 2023-01-06 ENCOUNTER — Ambulatory Visit: Payer: BC Managed Care – PPO | Admitting: Urgent Care

## 2023-01-06 ENCOUNTER — Other Ambulatory Visit: Payer: Self-pay

## 2023-01-06 ENCOUNTER — Encounter
Admission: RE | Disposition: A | Discharge status: Home / Self Care | Payer: Self-pay | Source: Home / Self Care | Attending: Surgery

## 2023-01-06 ENCOUNTER — Ambulatory Visit: Payer: BC Managed Care – PPO | Admitting: Family Medicine

## 2023-01-06 ENCOUNTER — Ambulatory Visit: Payer: Self-pay | Admitting: Urgent Care

## 2023-01-06 ENCOUNTER — Ambulatory Visit
Admission: RE | Admit: 2023-01-06 | Discharge: 2023-01-06 | Disposition: A | Discharge status: Home / Self Care | Payer: BC Managed Care – PPO | Attending: Surgery | Admitting: Surgery

## 2023-01-06 ENCOUNTER — Encounter: Payer: Self-pay | Admitting: Surgery

## 2023-01-06 DIAGNOSIS — I10 Essential (primary) hypertension: Secondary | ICD-10-CM

## 2023-01-06 DIAGNOSIS — Z7985 Long-term (current) use of injectable non-insulin antidiabetic drugs: Secondary | ICD-10-CM | POA: Insufficient documentation

## 2023-01-06 DIAGNOSIS — E1169 Type 2 diabetes mellitus with other specified complication: Secondary | ICD-10-CM

## 2023-01-06 DIAGNOSIS — M654 Radial styloid tenosynovitis [de Quervain]: Secondary | ICD-10-CM | POA: Insufficient documentation

## 2023-01-06 DIAGNOSIS — Z01818 Encounter for other preprocedural examination: Secondary | ICD-10-CM

## 2023-01-06 DIAGNOSIS — Z794 Long term (current) use of insulin: Secondary | ICD-10-CM | POA: Insufficient documentation

## 2023-01-06 DIAGNOSIS — G5603 Carpal tunnel syndrome, bilateral upper limbs: Secondary | ICD-10-CM | POA: Insufficient documentation

## 2023-01-06 DIAGNOSIS — E1165 Type 2 diabetes mellitus with hyperglycemia: Secondary | ICD-10-CM

## 2023-01-06 DIAGNOSIS — E1141 Type 2 diabetes mellitus with diabetic mononeuropathy: Secondary | ICD-10-CM | POA: Diagnosis not present

## 2023-01-06 HISTORY — PX: CARPAL TUNNEL RELEASE: SHX101

## 2023-01-06 LAB — GLUCOSE, CAPILLARY
Glucose-Capillary: 110 mg/dL — ABNORMAL HIGH (ref 70–99)
Glucose-Capillary: 137 mg/dL — ABNORMAL HIGH (ref 70–99)

## 2023-01-06 SURGERY — RELEASE, CARPAL TUNNEL, ENDOSCOPIC
Anesthesia: General | Site: Wrist | Laterality: Bilateral

## 2023-01-06 MED ORDER — ONDANSETRON HCL 4 MG/2ML IJ SOLN
INTRAMUSCULAR | Status: DC | PRN
  Administered 2023-01-06: 4 mg via INTRAVENOUS

## 2023-01-06 MED ORDER — CEFAZOLIN SODIUM-DEXTROSE 2-4 GM/100ML-% IV SOLN
INTRAVENOUS | Status: AC
  Filled 2023-01-06: qty 100

## 2023-01-06 MED ORDER — LISINOPRIL 10 MG PO TABS: 10.0000 mg | ORAL_TABLET | ORAL | Status: AC

## 2023-01-06 MED ORDER — BUPIVACAINE HCL (PF) 0.5 % IJ SOLN
INTRAMUSCULAR | Status: DC | PRN
  Administered 2023-01-06 (×2): 10 mL

## 2023-01-06 MED ORDER — OXYCODONE HCL 5 MG/5ML PO SOLN: 5.0000 mg | Freq: Once | ORAL | Status: DC | PRN

## 2023-01-06 MED ORDER — TADALAFIL 5 MG PO TABS: 5.0000 mg | ORAL_TABLET | ORAL | Status: DC

## 2023-01-06 MED ORDER — ATORVASTATIN CALCIUM 40 MG PO TABS: 40.0000 mg | ORAL_TABLET | ORAL | Status: DC

## 2023-01-06 MED ORDER — CHLORHEXIDINE GLUCONATE 0.12 % MT SOLN
OROMUCOSAL | Status: AC
  Filled 2023-01-06: qty 15

## 2023-01-06 MED ORDER — CALCIUM CHLORIDE 10 % IV SOLN
INTRAVENOUS | Status: AC
  Filled 2023-01-06: qty 10

## 2023-01-06 MED ORDER — KETOROLAC TROMETHAMINE 30 MG/ML IJ SOLN
30.0000 mg | Freq: Once | INTRAMUSCULAR | Status: AC
  Administered 2023-01-06: 30 mg via INTRAVENOUS

## 2023-01-06 MED ORDER — ONDANSETRON HCL 4 MG/2ML IJ SOLN: 4.0000 mg | Freq: Once | INTRAMUSCULAR | Status: DC | PRN

## 2023-01-06 MED ORDER — ACETAMINOPHEN 10 MG/ML IV SOLN: 1000.0000 mg | Freq: Once | INTRAVENOUS | Status: DC | PRN

## 2023-01-06 MED ORDER — FAMOTIDINE 20 MG PO TABS
ORAL_TABLET | ORAL | Status: AC
  Filled 2023-01-06: qty 1

## 2023-01-06 MED ORDER — MIDAZOLAM HCL 2 MG/2ML IJ SOLN
INTRAMUSCULAR | Status: AC
  Filled 2023-01-06: qty 2

## 2023-01-06 MED ORDER — SODIUM CHLORIDE 0.9 % IV SOLN: INTRAVENOUS | Status: DC

## 2023-01-06 MED ORDER — EMPAGLIFLOZIN 25 MG PO TABS: 25.0000 mg | ORAL_TABLET | ORAL | Status: AC

## 2023-01-06 MED ORDER — LIDOCAINE HCL (CARDIAC) PF 100 MG/5ML IV SOSY
PREFILLED_SYRINGE | INTRAVENOUS | Status: DC | PRN
  Administered 2023-01-06: 100 mg via INTRAVENOUS

## 2023-01-06 MED ORDER — METOCLOPRAMIDE HCL 5 MG/ML IJ SOLN: 5.0000 mg | Freq: Three times a day (TID) | INTRAMUSCULAR | Status: DC | PRN

## 2023-01-06 MED ORDER — 0.9 % SODIUM CHLORIDE (POUR BTL) OPTIME
TOPICAL | Status: DC | PRN
  Administered 2023-01-06: 500 mL

## 2023-01-06 MED ORDER — FENTANYL CITRATE (PF) 100 MCG/2ML IJ SOLN: 25.0000 ug | INTRAMUSCULAR | Status: DC | PRN

## 2023-01-06 MED ORDER — FENTANYL CITRATE (PF) 100 MCG/2ML IJ SOLN
INTRAMUSCULAR | Status: AC
  Filled 2023-01-06: qty 2

## 2023-01-06 MED ORDER — FENTANYL CITRATE (PF) 100 MCG/2ML IJ SOLN
INTRAMUSCULAR | Status: DC | PRN
  Administered 2023-01-06: 50 ug via INTRAVENOUS

## 2023-01-06 MED ORDER — DEXAMETHASONE SODIUM PHOSPHATE 10 MG/ML IJ SOLN
INTRAMUSCULAR | Status: DC | PRN
  Administered 2023-01-06: 10 mg via INTRAVENOUS

## 2023-01-06 MED ORDER — BUPIVACAINE HCL (PF) 0.5 % IJ SOLN
INTRAMUSCULAR | Status: AC
  Filled 2023-01-06: qty 30

## 2023-01-06 MED ORDER — ACETAMINOPHEN 325 MG PO TABS: 325.0000 mg | ORAL_TABLET | Freq: Four times a day (QID) | ORAL | Status: DC | PRN

## 2023-01-06 MED ORDER — KETOROLAC TROMETHAMINE 30 MG/ML IJ SOLN
INTRAMUSCULAR | Status: AC
  Filled 2023-01-06: qty 1

## 2023-01-06 MED ORDER — ONDANSETRON HCL 4 MG PO TABS: 4.0000 mg | ORAL_TABLET | Freq: Four times a day (QID) | ORAL | Status: DC | PRN

## 2023-01-06 MED ORDER — ONDANSETRON HCL 4 MG/2ML IJ SOLN: 4.0000 mg | Freq: Four times a day (QID) | INTRAMUSCULAR | Status: DC | PRN

## 2023-01-06 MED ORDER — OXYCODONE HCL 5 MG PO TABS: 5.0000 mg | ORAL_TABLET | Freq: Once | ORAL | Status: DC | PRN

## 2023-01-06 MED ORDER — METOCLOPRAMIDE HCL 10 MG PO TABS: 5.0000 mg | ORAL_TABLET | Freq: Three times a day (TID) | ORAL | Status: DC | PRN

## 2023-01-06 MED ORDER — MIDAZOLAM HCL 2 MG/2ML IJ SOLN
INTRAMUSCULAR | Status: DC | PRN
  Administered 2023-01-06: 2 mg via INTRAVENOUS

## 2023-01-06 MED ORDER — PROPOFOL 10 MG/ML IV BOLUS
INTRAVENOUS | Status: DC | PRN
  Administered 2023-01-06: 200 mg via INTRAVENOUS

## 2023-01-06 SURGICAL SUPPLY — 37 items
APL PRP STRL LF DISP 70% ISPRP (MISCELLANEOUS) ×4
BNDG CMPR 5X2 KNTD ELC UNQ LF (GAUZE/BANDAGES/DRESSINGS) ×2
BNDG CMPR 5X4 CHSV STRCH STRL (GAUZE/BANDAGES/DRESSINGS)
BNDG COHESIVE 4X5 TAN STRL LF (GAUZE/BANDAGES/DRESSINGS) ×1 IMPLANT
BNDG ELASTIC 2INX 5YD STR LF (GAUZE/BANDAGES/DRESSINGS) ×1 IMPLANT
BNDG ESMARCH 4 X 12 STRL LF (GAUZE/BANDAGES/DRESSINGS) ×1
BNDG ESMARCH 4X12 STRL LF (GAUZE/BANDAGES/DRESSINGS) ×1 IMPLANT
CHLORAPREP W/TINT 26 (MISCELLANEOUS) ×1 IMPLANT
CORD BIP STRL DISP 12FT (MISCELLANEOUS) ×1 IMPLANT
CUFF TOURN SGL QUICK 18X4 (TOURNIQUET CUFF) ×1 IMPLANT
DRAPE SURG 17X11 SM STRL (DRAPES) ×1 IMPLANT
FORCEPS JEWEL BIP 4-3/4 STR (INSTRUMENTS) ×1 IMPLANT
GAUZE SPONGE 4X4 12PLY STRL (GAUZE/BANDAGES/DRESSINGS) ×1 IMPLANT
GAUZE XEROFORM 1X8 LF (GAUZE/BANDAGES/DRESSINGS) ×1 IMPLANT
GLOVE BIO SURGEON STRL SZ8 (GLOVE) ×1 IMPLANT
GLOVE INDICATOR 8.0 STRL GRN (GLOVE) ×1 IMPLANT
GOWN STRL REUS W/ TWL LRG LVL3 (GOWN DISPOSABLE) ×1 IMPLANT
GOWN STRL REUS W/ TWL XL LVL3 (GOWN DISPOSABLE) ×1 IMPLANT
GOWN STRL REUS W/TWL LRG LVL3 (GOWN DISPOSABLE) ×1
GOWN STRL REUS W/TWL XL LVL3 (GOWN DISPOSABLE) ×1
KIT CARPAL TUNNEL (MISCELLANEOUS) ×1
KIT ESCP INSRT D SLOT CANN KN (MISCELLANEOUS) ×1 IMPLANT
KIT TURNOVER KIT A (KITS) ×1 IMPLANT
MANIFOLD NEPTUNE II (INSTRUMENTS) ×1 IMPLANT
NS IRRIG 500ML POUR BTL (IV SOLUTION) ×1 IMPLANT
PACK EXTREMITY ARMC (MISCELLANEOUS) ×1 IMPLANT
SPLINT WRIST LG LT TX990309 (SOFTGOODS) IMPLANT
SPLINT WRIST LG RT TX900304 (SOFTGOODS) IMPLANT
SPLINT WRIST M LT TX990308 (SOFTGOODS) IMPLANT
SPLINT WRIST M RT TX990303 (SOFTGOODS) IMPLANT
SPLINT WRIST XL LT TX990310 (SOFTGOODS) IMPLANT
SPLINT WRIST XL RT TX990305 (SOFTGOODS) IMPLANT
STOCKINETTE IMPERVIOUS 9X36 MD (GAUZE/BANDAGES/DRESSINGS) ×1 IMPLANT
SUT PROLENE 4 0 PS 2 18 (SUTURE) ×1 IMPLANT
TRAP FLUID SMOKE EVACUATOR (MISCELLANEOUS) ×1 IMPLANT
TUBING CONNECTING 10 (TUBING) IMPLANT
WATER STERILE IRR 500ML POUR (IV SOLUTION) ×1 IMPLANT

## 2023-01-06 NOTE — Op Note (Signed)
01/06/2023  4:00 PM  Patient:   Nathan Rojas  Pre-Op Diagnosis:   1.  Right carpal tunnel syndrome.  2. DeQuervain's tenosynovitis right wrist.  3. Left carpal tunnel syndrome.  4. DeQuervain's tenosynovitis left wrist.  Post-Op Diagnosis:   Same.  Procedure:   1. Endoscopic right carpal tunnel release.  2. Release of first dorsal compartment right wrist.  3. Endoscopic left carpal tunnel release.  4. Release of first dorsal compartment left wrist.  Surgeon:   Maryagnes Amos, MD  Assistant:   Jacqulyn Liner, PA-S  Anesthesia:   General LMA  Findings:   As above.  Complications:   None  EBL:   0 cc  Fluids:   400 cc crystalloid  TT:   24 minutes at 250 mmHg on right upper extremity; 29 minutes at 250 mmHg on left upper extremity  Drains:   None  Closure:   4-0 Prolene interrupted sutures  Brief Clinical Note:   The patient is a a 47 year old male with a long history of bilateral hand and wrist pain and paresthesias, as well as radial sided bilateral wrist pain. His symptoms have persisted despite medications, activity modification, etc. His history and examination are consistent with bilateral carpal tunnel syndrome as well as bilateral DeQuervain's tenosynovitis. The patient presents at this time for endoscopic bilateral carpal tunnel releases, as well as for first dorsal compartment releases of both wrists.   Procedure:   The patient was brought into the operating room and lain in the supine position. After adequate general laryngeal mask anesthesia was obtained, the left hand and upper extremity were prepped with ChloraPrep solution before being draped sterilely. Preoperative antibiotics were administered. A timeout was performed to verify the appropriate surgical site before the limb was exsanguinated with an Esmarch and the tourniquet inflated to 250 mmHg.   An approximately 1.5-2 cm incision was made over the volar wrist flexion crease, centered over the palmaris longus  tendon. The incision was carried down through the subcutaneous tissues with care taken to identify and protect any neurovascular structures. The distal forearm fascia was penetrated just proximal to the transverse carpal ligament. The soft tissues were released off the superficial and deep surfaces of the distal forearm fascia and this was released proximally for 3-4 cm under direct visualization.  Attention was directed distally. The Therapist, nutritional was passed beneath the transverse carpal ligament along the ulnar aspect of the carpal tunnel and used to release any adhesions as well as to remove any adherent synovial tissue before first the smaller then the larger of the two dilators were passed beneath the transverse carpal ligament along the ulnar margin of the carpal tunnel. The slotted cannula was introduced and the endoscope was placed into the slotted cannula and the undersurface of the transverse carpal ligament visualized. The distal margin of the transverse carpal ligament was marked by placing a 25-gauge needle percutaneously at Kaplan's cardinal point so that it entered the distal portion of the slotted cannula. Under endoscopic visualization, the transverse carpal ligament was released from proximal to distal using the end-cutting blade. A second pass was performed to ensure complete release of the ligament. The adequacy of release was verified both endoscopically and by palpation using the freer elevator.  Next, the first dorsal compartment was addressed. A 1.5-2 cm incision was made transversely over the first dorsal compartment. The incision was carried down through subcutaneous tissues with care taken to identify and protect the sensory nerves and veins running in this  area. The underlying retinaculum was identified. The first dorsal compartment was released from proximal to distal using Metzenbaum scissors. The underlying tendons were carefully inspected and found to be intact. No additional  adhesions were identified.  Each wound was irrigated thoroughly with sterile saline solution before being closed using 4-0 Prolene interrupted sutures. A total of 10 cc of 0.5% plain Sensorcaine was injected in and around the incisions before a sterile bulky dressing was applied to the wound. The patient was then placed into a volar wrist splint.  Attention was then directed to the left hand. The left hand and upper extremity were prepped with ChloraPrep solution before being draped sterilely. Preoperative antibiotics were administered. Again a timeout was performed to verify the appropriate surgical site before the limb was exsanguinated with an Esmarch and the tourniquet inflated to 250 mmHg.   An approximately 1.5-2 cm incision was made over the volar wrist flexion crease, centered over the palmaris longus tendon. The incision was carried down through the subcutaneous tissues with care taken to identify and protect any neurovascular structures. The distal forearm fascia was penetrated just proximal to the transverse carpal ligament. The soft tissues were released off the superficial and deep surfaces of the distal forearm fascia and this was released proximally for 3-4 cm under direct visualization.  Attention was directed distally. The Therapist, nutritional was passed beneath the transverse carpal ligament along the ulnar aspect of the carpal tunnel and used to release any adhesions as well as to remove any adherent synovial tissue before first the smaller then the larger of the two dilators were passed beneath the transverse carpal ligament along the ulnar margin of the carpal tunnel. The slotted cannula was introduced and the endoscope was placed into the slotted cannula and the undersurface of the transverse carpal ligament visualized. The distal margin of the transverse carpal ligament was marked by placing a 25-gauge needle percutaneously at Kaplan's cardinal point so that it entered the distal portion of  the slotted cannula. Under endoscopic visualization, the transverse carpal ligament was released from proximal to distal using the end-cutting blade. A second pass was performed to ensure complete release of the ligament. The adequacy of release was verified both endoscopically and by palpation using the freer elevator.  Next, the first dorsal compartment was addressed. A 1.5-2 cm incision was made transversely over the first dorsal compartment. The incision was carried down through subcutaneous tissues with care taken to identify and protect the sensory nerves and veins running in this area. The underlying retinaculum was identified. The first dorsal compartment was released from proximal to distal using Metzenbaum scissors. The underlying tendons were carefully inspected and found to be intact. No additional adhesions were identified.  Each wound was irrigated thoroughly with sterile saline solution before being closed using 4-0 Prolene interrupted sutures. A total of 10 cc of 0.5% plain Sensorcaine was injected in and around the incisions before a sterile bulky dressing was applied to the wound.  The patient was then placed into a Velcro volar wrist splint before being awakened, extubated, and returned to the recovery room in satisfactory condition after tolerating the procedure well.

## 2023-01-06 NOTE — Progress Notes (Signed)
Per Dr Corinda Gubler, Dr Joice Lofts needs this patient to have IV access in the foot preferably.

## 2023-01-06 NOTE — Anesthesia Postprocedure Evaluation (Signed)
Anesthesia Post Note  Patient: Nathan Rojas  Procedure(s) Performed: CARPAL TUNNEL RELEASE ENDOSCOPIC (Bilateral: Wrist)  Patient location during evaluation: PACU Anesthesia Type: General Level of consciousness: awake and alert Pain management: pain level controlled Vital Signs Assessment: post-procedure vital signs reviewed and stable Respiratory status: spontaneous breathing, nonlabored ventilation, respiratory function stable and patient connected to nasal cannula oxygen Cardiovascular status: blood pressure returned to baseline and stable Postop Assessment: no apparent nausea or vomiting Anesthetic complications: no   No notable events documented.   Last Vitals:  Vitals:   01/06/23 1610 01/06/23 1615  BP:  (!) 132/95  Pulse: 65 65  Resp: 14 13  Temp:    SpO2: 100% 100%    Last Pain:  Vitals:   01/06/23 1153  TempSrc: Oral                 Louie Boston

## 2023-01-06 NOTE — Discharge Instructions (Addendum)
Orthopedic discharge instructions: Keep dressings dry and intact. Keep hands elevated above heart level. May shower after dressing removed on postop day 4 (Monday). Cover sutures with Band-Aids after drying off, then reapply Velcro splints. Apply ice to affected area frequently. Take ibuprofen 600-800 mg TID with meals for 3-5 days, then as necessary. Take ES Tylenol if needed.  Return for follow-up in 10-14 days or as scheduled.   AMBULATORY SURGERY  DISCHARGE INSTRUCTIONS   The drugs that you were given will stay in your system until tomorrow so for the next 24 hours you should not:  Drive an automobile Make any legal decisions Drink any alcoholic beverage   You may resume regular meals tomorrow.  Today it is better to start with liquids and gradually work up to solid foods.  You may eat anything you prefer, but it is better to start with liquids, then soup and crackers, and gradually work up to solid foods.   Please notify your doctor immediately if you have any unusual bleeding, trouble breathing, redness and pain at the surgery site, drainage, fever, or pain not relieved by medication.   Additional Instructions:

## 2023-01-06 NOTE — H&P (Signed)
Chief Complaint: Bilateral wrist pain  Nathan Rojas is a 47 y.o. male who presents today for repeat evaluation of bilateral hand and wrist pain.  The patient has been seen in the past and has been diagnosed with bilateral de Quervain's tenosynovitis and has undergone bilateral wrist injections for underlying de Quervain's tenosynovitis.  The patient states that these injections have been providing moderate relief.  Recently the patient reports worsening burning and pain in the palms of his hands.  He was evaluated by a different orthopedic group who did perform a left carpal tunnel steroid injection which did provide relief of his left hand pain and discomfort.  At today's visit he reports primarily a painful catching and locking in his left index and middle fingers.  He does not have any pain along the radial aspect the wrist or any burning or tingling at this time.  The patient does still have moderate pain along the radial aspect of the right wrist at today's visit.  Worse when gripping and lifting objects at today's appointment.  The patient is quite frustrated by his continued bilateral hand pain and would like to discuss more aggressive treatment options at this time.  He denies any personal history of heart attack or stroke.  He denies any history of asthma or COPD.  The patient is diabetic but is very well-controlled at this time.  He denies any history of blood clots in the past.     Past Medical History: Past Medical History      Past Medical History:  Diagnosis Date   Arthritis     Hyperlipemia, mixed     Hypertension     Type 2 diabetes mellitus (CMS-HCC)          Past Surgical History: Past Surgical History       Past Surgical History:  Procedure Laterality Date   APPENDECTOMY       TONSILLECTOMY       VASECTOMY            Past Family History: Family History       Family History  Problem Relation Age of Onset   Diabetes Sister     Dementia Maternal Grandmother          Medications: Current Medications        Current Outpatient Medications  Medication Sig Dispense Refill   ACCU-CHEK GUIDE GLUCOSE METER Misc USE EVERY DAY TO MONTIOR BLOOD SUGARS       acetaminophen (TYLENOL) 650 MG ER tablet Take 650 mg by mouth as needed for Pain       anastrozole (ARIMIDEX) 1 mg tablet Take 0.5 mg by mouth once a week       aspirin 81 MG EC tablet Take by mouth.       atorvastatin (LIPITOR) 40 MG tablet Take 40 mg by mouth at bedtime       blood glucose diagnostic (ACCU-CHEK GUIDE TEST STRIPS) test strip USE1 STRIP 3 TIMES A DAY TO MONITOR SUGARS 180 strip 3   cholecalciferol 1000 unit tablet Take by mouth once daily       empagliflozin (JARDIANCE) 25 mg tablet Take 1 tablet (25 mg total) by mouth once daily 90 tablet 3   ibuprofen (MOTRIN) 200 MG tablet Take 400 mg by mouth as needed for Pain       insulin DEGLUDEC (TRESIBA FLEXTOUCH U-200) pen injector (concentration 200 units/mL) Inject 60 Units subcutaneously at bedtime 27 mL 3   insulin REGULAR (HUMULIN R) injection (  concentration 100 units/mL) Inject subcutaneously 3 (three) times daily before meals 20-30 units TID with meals       lisinopriL (ZESTRIL) 10 MG tablet Take 1 tablet by mouth once daily       meclizine (ANTIVERT) 12.5 mg tablet Take 12.5 mg by mouth 3 (three) times daily as needed for Dizziness       meloxicam (MOBIC) 15 MG tablet Take 15 mg by mouth once daily       MULTIVITAMIN ORAL Take by mouth       omega 3-dha-epa-fish oil (FISH OIL) 1,000 mg (120 mg-180 mg) Cap Take by mouth       peg-electrolyte (NULYTELY) solution         semaglutide (OZEMPIC) 2 mg/dose (8 mg/3 mL) pen injector Inject 0.75 mLs (2 mg total) subcutaneously once a week 9 mL 3   tadalafiL (CIALIS) 5 MG tablet Take 5 mg by mouth once daily       DEXCOM G7 SENSOR Devi Use 1 each every 10 (ten) days (Patient not taking: Reported on 01/26/2022) 3 each 12   pen needle, diabetic (PEN NEEDLE) 30 gauge x 5/16" needle Use as directed  (Patient not taking: Reported on 01/26/2022) 100 each 11    No current facility-administered medications for this visit.        Allergies: Allergies  No Known Allergies      Review of Systems:  A comprehensive 14 point ROS was performed, reviewed by me today, and the pertinent orthopaedic findings are documented in the HPI.   Exam: BP 138/78   Ht 185.4 cm (6\' 1" )   Wt (!) 101.5 kg (223 lb 12.8 oz)   BMI 29.53 kg/m  General/Constitutional: The patient appears to be well-nourished, well-developed, and in no acute distress. Neuro/Psych: Normal mood and affect, oriented to person, place and time. Eyes: Non-icteric.  Pupils are equal, round, and reactive to light, and exhibit synchronous movement. ENT: Unremarkable. Lymphatic: No palpable adenopathy. Respiratory: Lungs clear to auscultation, Normal chest excursion, No wheezes, and Non-labored breathing Cardiovascular: Regular rate and rhythm.  No murmurs. and No edema, swelling or tenderness, except as noted in detailed exam. Integumentary: No impressive skin lesions present, except as noted in detailed exam. Musculoskeletal: Unremarkable, except as noted in detailed exam.   General: Well developed, well nourished 47 y.o. male in no apparent distress.  Normal affect.  Normal communication.  Patient answers questions appropriately.  The patient has a normal gait.  There is no antalgic component.  There is no hip lurch.     Bilateral Upper Extremity: Examination of the bilateral hand revealed no bony abnormality, no ecchymosis, and no edema.  The patient has decreased flexion of the right thumb due to pain.  Patient is tender palpation over the radial aspect of bilateral thumbs in the location of the anatomic snuffbox and extending along the radial aspect of bilateral forearms.  There was no instability of the wrist or hand.  There is no nodularity or cysts noted.  There was no triggering of the digits.  The patient had a positive  Finkelstein test bilaterally.  The patient had full composite fist.  There was no angulation or rotation of the digits.  Positive Tinel's and Phalen's test to bilateral upper extremities at today's appointment.  The patient does have a painful catching and locking of his left index and middle fingers at today's visit.   Neurologic: The patient had sensation that was intact to light touch.  The patient had full  motor strength.  There was no tremor or clonus noted.     Vascular: The patient had good skin warmth.  The radial and ulnar pulse were normal and intact.  The patient had less than 2 second capillary refill.     Imaging: None.   Impression: De Quervain's tenosynovitis [M65.4] De Quervain's tenosynovitis  (primary encounter diagnosis) Bilateral carpal tunnel syndrome   Plan:  1.  Treatment options were discussed today with the patient. 2.  I believe that the patient has several different conditions that are contributing to his bilateral hand pain at this time. 3.  He is continuing to experience pain related to de Quervain's tenosynovitis and possible underlying carpal tunnel syndrome.  He is quite frustrated by his continued discomfort at this time. 4.  The patient would like to undergo bilateral de Quervain's tenosynovitis releases in addition to endoscopic carpal tunnel release procedures.  This will be done by Dr. Joice Lofts. 5.  The patient has been instructed on the risk and benefits of surgery and undergoing both procedures at the same time and wishes to proceed at this time. 6.  This document will serve as a surgical history and physical for the patient. 7.  They can call the clinic they have any questions, new symptoms develop or symptoms worsen.  The procedure was discussed with the patient, as were the potential risks (including bleeding, infection, nerve and/or blood vessel injury, persistent or recurrent pain, failure of the repair, progression of arthritis, need for further  surgery, blood clots, strokes, heart attacks and/or arhythmias, pneumonia, etc.) and benefits.  The patient states his understanding and wishes to proceed.    This note was generated in part with voice recognition software and I apologize for any typographical errors that were not detected and corrected.   Valeria Batman, PA-C [facility]

## 2023-01-06 NOTE — H&P (Signed)
History of Present Illness: Nathan Rojas is a 47 y.o. male who presents today for repeat evaluation of bilateral hand and wrist pain. The patient has been seen in the past and has been diagnosed with bilateral de Quervain's tenosynovitis and has undergone bilateral wrist injections for underlying de Quervain's tenosynovitis. The patient states that these injections have been providing moderate relief. Recently the patient reports worsening burning and pain in the palms of his hands. He was evaluated by a different orthopedic group who did perform a left carpal tunnel steroid injection which did provide relief of his left hand pain and discomfort. At today's visit he reports primarily a painful catching and locking in his left index and middle fingers. He does not have any pain along the radial aspect the wrist or any burning or tingling at this time. The patient does still have moderate pain along the radial aspect of the right wrist at today's visit. Worse when gripping and lifting objects at today's appointment. The patient is quite frustrated by his continued bilateral hand pain and would like to discuss more aggressive treatment options at this time. He denies any personal history of heart attack or stroke. He denies any history of asthma or COPD. The patient is diabetic but is very well-controlled at this time. He denies any history of blood clots in the past.  Past Medical History: Arthritis  Hyperlipemia, mixed  Hypertension  Type 2 diabetes mellitus (CMS-HCC)   Past Surgical History: APPENDECTOMY  TONSILLECTOMY  VASECTOMY   Past Family History: Diabetes Sister  Dementia Maternal Grandmother   Medications: ACCU-CHEK GUIDE GLUCOSE METER Misc USE EVERY DAY TO MONTIOR BLOOD SUGARS  acetaminophen (TYLENOL) 650 MG ER tablet Take 650 mg by mouth as needed for Pain  anastrozole (ARIMIDEX) 1 mg tablet Take 0.5 mg by mouth once a week  aspirin 81 MG EC tablet Take by mouth.  atorvastatin  (LIPITOR) 40 MG tablet Take 40 mg by mouth at bedtime  blood glucose diagnostic (ACCU-CHEK GUIDE TEST STRIPS) test strip USE1 STRIP 3 TIMES A DAY TO MONITOR SUGARS 180 strip 3  cholecalciferol 1000 unit tablet Take by mouth once daily  empagliflozin (JARDIANCE) 25 mg tablet Take 1 tablet (25 mg total) by mouth once daily 90 tablet 3  ibuprofen (MOTRIN) 200 MG tablet Take 400 mg by mouth as needed for Pain  insulin DEGLUDEC (TRESIBA FLEXTOUCH U-200) pen injector (concentration 200 units/mL) Inject 60 Units subcutaneously at bedtime 27 mL 3  insulin REGULAR (HUMULIN R) injection (concentration 100 units/mL) Inject subcutaneously 3 (three) times daily before meals 20-30 units TID with meals  lisinopriL (ZESTRIL) 10 MG tablet Take 1 tablet by mouth once daily  meclizine (ANTIVERT) 12.5 mg tablet Take 12.5 mg by mouth 3 (three) times daily as needed for Dizziness  meloxicam (MOBIC) 15 MG tablet Take 15 mg by mouth once daily  MULTIVITAMIN ORAL Take by mouth  omega 3-dha-epa-fish oil (FISH OIL) 1,000 mg (120 mg-180 mg) Cap Take by mouth  peg-electrolyte (NULYTELY) solution  semaglutide (OZEMPIC) 2 mg/dose (8 mg/3 mL) pen injector Inject 0.75 mLs (2 mg total) subcutaneously once a week 9 mL 3  tadalafiL (CIALIS) 5 MG tablet Take 5 mg by mouth once daily  DEXCOM G7 SENSOR Devi Use 1 each every 10 (ten) days (Patient not taking: Reported on 01/26/2022) 3 each 12  pen needle, diabetic (PEN NEEDLE) 30 gauge x 5/16" needle Use as directed (Patient not taking: Reported on 01/26/2022) 100 each 11   Allergies: No Known  Allergies   Review of Systems:  A comprehensive 14 point ROS was performed, reviewed by me today, and the pertinent orthopaedic findings are documented in the HPI.  Physical Exam: BP 138/78  Ht 185.4 cm (6\' 1" )  Wt (!) 101.5 kg (223 lb 12.8 oz)  BMI 29.53 kg/m  General/Constitutional: The patient appears to be well-nourished, well-developed, and in no acute distress. Neuro/Psych:  Normal mood and affect, oriented to person, place and time. Eyes: Non-icteric. Pupils are equal, round, and reactive to light, and exhibit synchronous movement. ENT: Unremarkable. Lymphatic: No palpable adenopathy. Respiratory: Lungs clear to auscultation, Normal chest excursion, No wheezes, and Non-labored breathing Cardiovascular: Regular rate and rhythm. No murmurs. and No edema, swelling or tenderness, except as noted in detailed exam. Integumentary: No impressive skin lesions present, except as noted in detailed exam. Musculoskeletal: Unremarkable, except as noted in detailed exam.  General: Well developed, well nourished 47 y.o. male in no apparent distress. Normal affect. Normal communication. Patient answers questions appropriately. The patient has a normal gait. There is no antalgic component. There is no hip lurch.   Bilateral Upper Extremity: Examination of each hand revealed no bony abnormality, no ecchymosis, and no edema. The patient has decreased flexion of the right thumb due to pain. Patient is tender palpation over the radial aspect of bilateral thumbs in the location of the anatomic snuffbox and extending along the radial aspect of bilateral forearms. There was no instability of the wrist or hand. There is no nodularity or cysts noted. There was no triggering of the digits. The patient had a positive Finkelstein test bilaterally. The patient had full composite fist. There was no angulation or rotation of the digits. Positive Tinel's and Phalen's test to bilateral upper extremities at today's appointment.   Neurologic: The patient had sensation that was intact to light touch. The patient had full motor strength. There was no tremor or clonus noted.   Vascular: The patient had good skin warmth. The radial and ulnar pulse were normal and intact. The patient had less than 2 second capillary refill.   Imaging: None.  Impression: 1. De Quervain's tenosynovitis of both  wrists. 2. Bilateral carpal tunnel syndrome  Plan:  1. Treatment options were discussed today with the patient. 2. I believe that the patient has several different conditions that are contributing to his bilateral hand pain at this time. 3. He is continuing to experience pain related to de Quervain's tenosynovitis and possible underlying carpal tunnel syndrome. He is quite frustrated by his continued discomfort at this time. 4. The patient would like to undergo bilateral de Quervain's tenosynovitis releases in addition to endoscopic carpal tunnel release procedures. This will be done by Dr. Joice Lofts. 5. The patient has been instructed on the risk and benefits of surgery and undergoing both procedures at the same time and wishes to proceed at this time. 6. This document will serve as a surgical history and physical for the patient. 7. They can call the clinic they have any questions, new symptoms develop or symptoms worsen.    H&P reviewed and patient re-examined. No changes.

## 2023-01-06 NOTE — Anesthesia Preprocedure Evaluation (Signed)
Anesthesia Evaluation  Patient identified by MRN, date of birth, ID band Patient awake    Reviewed: Allergy & Precautions, NPO status , Patient's Chart, lab work & pertinent test results  History of Anesthesia Complications Negative for: history of anesthetic complications  Airway Mallampati: III  TM Distance: >3 FB Neck ROM: full    Dental  (+) Missing, Poor Dentition   Pulmonary neg pulmonary ROS, neg sleep apnea, neg COPD, Patient abstained from smoking.Not current smoker, former smoker   Pulmonary exam normal breath sounds clear to auscultation       Cardiovascular Exercise Tolerance: Good METShypertension, On Medications + CAD  (-) Past MI Normal cardiovascular exam(-) dysrhythmias  Rhythm:Regular Rate:Normal - Systolic murmurs    Neuro/Psych negative neurological ROS  negative psych ROS   GI/Hepatic negative GI ROS, Neg liver ROS,neg GERD  ,,  Endo/Other  diabetes, Type 2, Insulin Dependent, Oral Hypoglycemic Agents  GLP1 agonist not used in a few weeks  Renal/GU Renal disease  negative genitourinary   Musculoskeletal   Abdominal   Peds  Hematology negative hematology ROS (+)   Anesthesia Other Findings Past Medical History: 01/23/2016: Chronic pain of multiple joints No date: CKD (chronic kidney disease) 01/09/2012: Coronary artery disease, non-occlusive     Comment:  Coronary CT Angiogram: Coronary Calcium Score 92.  CAD               RADS 2.  Mild-nonobstructive disease in the LAD (25-49%).              Minimal (<25%) disease in RCA and LCx.  Right dominant               system normal origins. 01/23/2016: CRP elevated 05/27/2016: Flexor tenosynovitis of thumb 12/2020: Hepatic steatosis     Comment:  noted on chest CT. No date: Hyperlipidemia No date: Hypertension 11/22/2014: Kidney stone around age 62: MRSA infection     Comment:  left hand wound 01/23/2016: Rheumatoid arthritis, seropositive  (HCC) 06/03/2015: Trigeminal neuralgia No date: Type 2 diabetes mellitus, uncontrolled 07/09/2016: Vitamin D deficiency  Past Surgical History: No date: APPENDECTOMY 05/06/2017: CHOLECYSTECTOMY; N/A     Comment:  Procedure: LAPAROSCOPIC CHOLECYSTECTOMY;  Surgeon:               Ancil Linsey, MD;  Location: [facility] ORS;  Service:               General;  Laterality: N/A; 06/13/2020: COLONOSCOPY WITH PROPOFOL; N/A     Comment:  Procedure: COLONOSCOPY WITH PROPOFOL;  Surgeon:               Pasty Spillers, MD;  Location: [facility];                Service: Endoscopy;  Laterality: N/A; No date: TONSILLECTOMY No date: TONSILLECTOMY 03/2013: VASECTOMY     Reproductive/Obstetrics negative OB ROS                             Anesthesia Physical Anesthesia Plan  ASA: 3  Anesthesia Plan: General   Post-op Pain Management: Minimal or no pain anticipated and Ofirmev IV (intra-op)*   Induction: Intravenous  PONV Risk Score and Plan: 2 and Propofol infusion, TIVA, Midazolam, Ondansetron and Dexamethasone  Airway Management Planned: Natural Airway and Nasal Cannula  Additional Equipment: None  Intra-op Plan:   Post-operative Plan: Extubation in OR  Informed Consent: I have reviewed the patients History and Physical, chart, labs  and discussed the procedure including the risks, benefits and alternatives for the proposed anesthesia with the patient or authorized representative who has indicated his/her understanding and acceptance.     Dental Advisory Given  Plan Discussed with: Anesthesiologist, CRNA and Surgeon  Anesthesia Plan Comments: (Patient consented for risks of anesthesia including but not limited to:  - adverse reactions to medications - risk of airway placement if required - damage to eyes, teeth, lips or other oral mucosa - nerve damage due to positioning  - sore throat or hoarseness - Damage to heart, brain, nerves, lungs, other  parts of body or loss of life  Patient voiced understanding.)       Anesthesia Quick Evaluation

## 2023-01-06 NOTE — Anesthesia Procedure Notes (Signed)
Procedure Name: LMA Insertion Date/Time: 01/06/2023 2:26 PM  Performed by: Edmund Hilda, CRNAPre-anesthesia Checklist: Patient identified, Patient being monitored, Timeout performed, Emergency Drugs available and Suction available Patient Re-evaluated:Patient Re-evaluated prior to induction Oxygen Delivery Method: Circle system utilized Preoxygenation: Pre-oxygenation with 100% oxygen Induction Type: IV induction Ventilation: Mask ventilation without difficulty LMA: LMA inserted LMA Size: 5.0 Tube type: Oral Number of attempts: 1 Placement Confirmation: positive ETCO2 and breath sounds checked- equal and bilateral Tube secured with: Tape Dental Injury: Teeth and Oropharynx as per pre-operative assessment

## 2023-01-06 NOTE — Transfer of Care (Signed)
Immediate Anesthesia Transfer of Care Note  Patient: Nathan Rojas  Procedure(s) Performed: CARPAL TUNNEL RELEASE ENDOSCOPIC (Bilateral: Wrist)  Patient Location: PACU  Anesthesia Type:General  Level of Consciousness: drowsy and patient cooperative  Airway & Oxygen Therapy: Patient Spontanous Breathing and Patient connected to face mask oxygen  Post-op Assessment: Report given to RN, Post -op Vital signs reviewed and stable, and Patient moving all extremities  Post vital signs: Reviewed and stable  Last Vitals:  Vitals Value Taken Time  BP 116/73 01/06/23 1600  Temp 97.22f   Pulse 67 01/06/23 1601  Resp 15 01/06/23 1601  SpO2 98 % 01/06/23 1601  Vitals shown include unfiled device data.  Last Pain:  Vitals:   01/06/23 1153  TempSrc: Oral         Complications: No notable events documented.

## 2023-01-07 ENCOUNTER — Encounter: Payer: Self-pay | Admitting: Surgery

## 2023-01-10 ENCOUNTER — Encounter: Payer: Self-pay | Admitting: Physician Assistant

## 2023-01-10 ENCOUNTER — Ambulatory Visit: Payer: BC Managed Care – PPO | Admitting: Physician Assistant

## 2023-01-10 VITALS — BP 138/84 | HR 81 | Temp 97.7°F | Resp 16 | Ht 72.5 in | Wt 224.3 lb

## 2023-01-10 DIAGNOSIS — E1159 Type 2 diabetes mellitus with other circulatory complications: Secondary | ICD-10-CM | POA: Diagnosis not present

## 2023-01-10 DIAGNOSIS — E1165 Type 2 diabetes mellitus with hyperglycemia: Secondary | ICD-10-CM

## 2023-01-10 DIAGNOSIS — E1169 Type 2 diabetes mellitus with other specified complication: Secondary | ICD-10-CM | POA: Diagnosis not present

## 2023-01-10 DIAGNOSIS — I152 Hypertension secondary to endocrine disorders: Secondary | ICD-10-CM

## 2023-01-10 DIAGNOSIS — E785 Hyperlipidemia, unspecified: Secondary | ICD-10-CM

## 2023-01-10 DIAGNOSIS — Z794 Long term (current) use of insulin: Secondary | ICD-10-CM

## 2023-01-10 NOTE — Assessment & Plan Note (Signed)
Chronic, historic condition Patient is currently taking atorvastatin 40 mg p.o. daily and appears to be tolerating well Continue current regimen Recheck lipid panel at follow-up in 6 months

## 2023-01-10 NOTE — Progress Notes (Signed)
Established Patient Office Visit  Name: Nathan Rojas   MRN: [id number]    DOB: 17-Sep-1975   Date:01/10/2023  Today's Provider: Jacquelin Hawking, MHS, PA-C Introduced myself to the patient as a PA-C and provided education on APPs in clinical practice.         Subjective  Chief Complaint  Chief Complaint  Patient presents with   Hypertension   Hyperlipidemia   Diabetes    HPI  HYPERTENSION / HYPERLIPIDEMIA Satisfied with current treatment? yes Duration of hypertension: chronic BP monitoring frequency: a few times a week BP range: usually getting 125/80 BP medication side effects: no Past BP meds: Lisinopril 10 mg PO every day  Duration of hyperlipidemia: years Cholesterol medication side effects: no Cholesterol supplements: none Past cholesterol medications: atorvastain (lipitor) Medication compliance: good compliance Aspirin: yes Recent stressors: no Recurrent headaches: no Visual changes: no Palpitations: no Dyspnea: no Chest pain: no Lower extremity edema: no Dizzy/lightheaded: no  Diabetes, Type 2 - Last A1c 6.2 - Medications: Tresiba 60 units as needed, Ozempic 2 mg weekly,Jardiance 25 mg PO every day,  - Compliance: good  - Checking BG at home: he has CGM - denies lows  - Diet:  He is following high protein diet  - Exercise: He is exercising by going to the gym 3-4 days week, does classes with mix of cardio and weights  - Eye exam: Overdue- discussed importance of annual eye exam, he has completed this this year - [facility] on [address] - request records  - Foot exam: completed today  - Microalbumin: UTD - Statin: on statin  - PNA vaccine: NA - Denies symptoms of hypoglycemia, polyuria, polydipsia, numbness extremities, foot ulcers/trauma He sees Dr. Tedd Sias today for management    Had bilateral Carpal tunnel release on Thursday  Has regular follow up with Ortho for post-op   Patient Active Problem List   Diagnosis Date Noted    Adenomatous polyp of colon 11/05/2022   Low testosterone 07/09/2022   Pulmonary nodules 01/29/2021   Hypertension associated with type 2 diabetes mellitus (HCC) 05/01/2018   Type 2 diabetes mellitus with hyperglycemia, with long-term current use of insulin (HCC) 05/01/2018   Encounter for screening colonoscopy 01/10/2018   Erectile dysfunction 04/27/2017   Rheumatoid arthritis, 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HCC) 01/23/2016   Obesity 04/18/2015   Abdominal aortic atherosclerosis (HCC) 11/16/2014   Hyperlipidemia associated with type 2 diabetes mellitus (HCC)    Coronary artery disease, non-occlusive 01/09/2012    Past Surgical History:  Procedure Laterality Date   APPENDECTOMY     CARPAL TUNNEL RELEASE Bilateral 01/06/2023   Procedure: CARPAL TUNNEL RELEASE ENDOSCOPIC;  Surgeon: Christena Flake, MD;  Location: [facility] ORS;  Service: Orthopedics;  Laterality: Bilateral;   CHOLECYSTECTOMY N/A 05/06/2017   Procedure: LAPAROSCOPIC CHOLECYSTECTOMY;  Surgeon: Ancil Linsey, MD;  Location: [facility];  Service: General;  Laterality: N/A;   COLONOSCOPY WITH PROPOFOL N/A 06/13/2020   Procedure: COLONOSCOPY WITH PROPOFOL;  Surgeon: Pasty Spillers, MD;  Location: [facility] ENDOSCOPY;  Service: Endoscopy;  Laterality: N/A;   COLONOSCOPY WITH PROPOFOL N/A 11/05/2022   Procedure: COLONOSCOPY WITH PROPOFOL;  Surgeon: Wyline Mood, MD;  Location: [facility] ENDOSCOPY;  Service: Gastroenterology;  Laterality: N/A;   POLYPECTOMY  11/05/2022   Procedure: POLYPECTOMY;  Surgeon: Wyline Mood, MD;  Location: [facility] ENDOSCOPY;  Service: Gastroenterology;;   TONSILLECTOMY     TONSILLECTOMY     VASECTOMY  03/2013    Family History  Problem Relation Age of Onset   Diabetes type II Mother        resolved after GOP   Diabetes type I Sister        type 1   Diabetes Sister    Dementia Maternal Grandmother    AAA (abdominal aortic aneurysm) Maternal Grandfather    Hypertension Maternal Grandfather    Heart disease Maternal Uncle      Social History   Tobacco Use   Smoking status: Former    Current packs/day: 0.00    Average packs/day: 1 pack/day for 25.9 years (25.9 ttl pk-yrs)    Types: Cigarettes    Start date: 03/29/1989    Quit date: 02/08/2015    Years since quitting: 7.9   Smokeless tobacco: Never  Substance Use Topics   Alcohol use: Yes    Comment: rare     Current Outpatient Medications:    anastrozole (ARIMIDEX) 1 MG tablet, Take 1 mg by mouth once a week. Saturdays, Disp: , Rfl:    Ascorbic Acid (VITAMIN C PO), Take 1 tablet by mouth daily., Disp: , Rfl:    aspirin EC 81 MG tablet, Take 81 mg by mouth daily., Disp: , Rfl:    atorvastatin (LIPITOR) 40 MG tablet, Take 1 tablet (40 mg total) by mouth every morning., Disp: , Rfl:    cholecalciferol (VITAMIN D) 1000 units tablet, Take 1 tablet (1,000 Units total) by mouth daily., Disp: , Rfl:    Continuous Blood Gluc Sensor (DEXCOM G7 SENSOR) MISC, USE 1 EACH EVERY 10 (TEN) DAYS, Disp: , Rfl:    empagliflozin (JARDIANCE) 25 MG TABS tablet, Take 1 tablet (25 mg total) by mouth every morning., Disp: , Rfl:    HYDROcodone-acetaminophen (NORCO) 5-325 MG tablet, Take 1 tablet by mouth every 6 (six) hours as needed for moderate pain., Disp: 15 tablet, Rfl: 0   lisinopril (ZESTRIL) 10 MG tablet, Take 1 tablet (10 mg total) by mouth every morning., Disp: , Rfl:    Multiple Vitamin (MULTIVITAMIN WITH MINERALS) TABS tablet, Take 1 tablet by mouth daily., Disp: , Rfl:    Semaglutide, 2 MG/DOSE, (OZEMPIC, 2 MG/DOSE,) 8 MG/3ML SOPN, Inject 2 mg into the skin once a week. Saturdays, Disp: , Rfl:    tadalafil (CIALIS) 20 MG tablet, TAKE 1 TABLET BY MOUTH DAILY AS NEEDED FOR ERECTILE DYSFUNCTION, Disp: 30 tablet, Rfl: 3   tadalafil (CIALIS) 5 MG tablet, Take 1 tablet (5 mg total) by mouth every morning., Disp: , Rfl:    TRESIBA FLEXTOUCH 200 UNIT/ML FlexTouch Pen, 60 Units as needed (Pt has been having alot better control over bs and only uses this rarely now)., Disp: ,  Rfl:   No Known Allergies  I personally reviewed active problem list, medication list, allergies, health maintenance, notes from last encounter, lab results with the patient/caregiver today.   ROS  See HPI for pertinent ROS   Objective  Vitals:   01/10/23 1042  BP: 138/84  Pulse: 81  Resp: 16  Temp: 97.7 F (36.5 C)  TempSrc: Oral  SpO2: 98%  Weight: 224 lb 4.8 oz (101.7 kg)  Height: 6' 0.5" (1.842 m)    Body mass index is 30 kg/m.  Physical Exam Vitals reviewed.  Constitutional:      General: He is awake.     Appearance: Normal appearance. He is well-developed and well-groomed.  HENT:     Head: Normocephalic and atraumatic.  Cardiovascular:     Rate and Rhythm: Normal rate and regular rhythm.  Pulses: Normal pulses.          Radial pulses are 2+ on the right side and 2+ on the left side.       Dorsalis pedis pulses are 2+ on the right side and 2+ on the left side.     Heart sounds: Normal heart sounds. No murmur heard.    No friction rub. No gallop.  Pulmonary:     Effort: Pulmonary effort is normal.     Breath sounds: Normal breath sounds. No decreased air movement. No decreased breath sounds, wheezing, rhonchi or rales.  Musculoskeletal:     Right lower leg: No edema.     Left lower leg: No edema.  Skin:    General: Skin is warm and dry.  Neurological:     General: No focal deficit present.     Mental Status: He is alert and oriented to person, place, and time. Mental status is at baseline.  Psychiatric:        Mood and Affect: Mood normal.        Behavior: Behavior normal. Behavior is cooperative.        Thought Content: Thought content normal.        Judgment: Judgment normal.      Recent Results (from the past 2160 hour(s))  Glucose, capillary     Status: Abnormal   Collection Time: 11/05/22  8:01 AM  Result Value Ref Range   Glucose-Capillary 126 (H) 70 - 99 mg/dL    Comment: Glucose reference range applies only to samples taken after  fasting for at least 8 hours.  Hemoglobin A1c     Status: Abnormal   Collection Time: 12/31/22  9:30 AM  Result Value Ref Range   Hgb A1c MFr Bld 6.2 (H) 4.8 - 5.6 %    Comment: (NOTE) Pre diabetes:          5.7%-6.4%  Diabetes:              >6.4%  Glycemic control for   <7.0% adults with diabetes    Mean Plasma Glucose 131.24 mg/dL    Comment: Performed at [facility], [address] [place], [place] [address]  Glucose, capillary     Status: Abnormal   Collection Time: 01/06/23 11:38 AM  Result Value Ref Range   Glucose-Capillary 110 (H) 70 - 99 mg/dL    Comment: Glucose reference range applies only to samples taken after fasting for at least 8 hours.  Glucose, capillary     Status: Abnormal   Collection Time: 01/06/23  4:03 PM  Result Value Ref Range   Glucose-Capillary 137 (H) 70 - 99 mg/dL    Comment: Glucose reference range applies only to samples taken after fasting for at least 8 hours.     PHQ2/9:    01/10/2023   10:42 AM 07/09/2022    9:32 AM 12/31/2021   10:02 AM 05/08/2021    9:32 AM 10/31/2020    8:54 AM  Depression screen PHQ 2/9  Decreased Interest 0 0 0 0 0  Down, Depressed, Hopeless 0 0 0 0 0  PHQ - 2 Score 0 0 0 0 0  Altered sleeping 0 0 0  0  Tired, decreased energy 0 0 0  0  Change in appetite 0 0 0  0  Feeling bad or failure about yourself  0 0 0  0  Trouble concentrating 0 0 0  0  Moving slowly or fidgety/restless 0 0 0  0  Suicidal thoughts 0 0 0  0  PHQ-9 Score 0 0 0  0  Difficult doing work/chores Not difficult at all Not difficult at all Not difficult at all  Not difficult at all      Fall Risk:    01/10/2023   10:42 AM 07/09/2022    9:31 AM 12/31/2021   10:02 AM 05/08/2021    9:32 AM 10/31/2020    8:54 AM  Fall Risk   Falls in the past year? 0 0 0 0 0  Number falls in past yr: 0 0 0 0 0  Injury with Fall? 0 0 0 0 0  Risk for fall due to : No Fall Risks No Fall Risks No Fall Risks    Follow up Education provided;Falls evaluation  completed;Falls prevention discussed Falls prevention discussed;Education provided;Falls evaluation completed Falls prevention discussed;Education provided Falls evaluation completed       Functional Status Survey: Is the patient deaf or have difficulty hearing?: No Does the patient have difficulty seeing, even when wearing glasses/contacts?: No Does the patient have difficulty concentrating, remembering, or making decisions?: No Does the patient have difficulty walking or climbing stairs?: No Does the patient have difficulty dressing or bathing?: No Does the patient have difficulty doing errands alone such as visiting a doctor's office or shopping?: No    Assessment & Plan  Problem List Items Addressed This Visit       Cardiovascular and Mediastinum   Hypertension associated with type 2 diabetes mellitus (HCC) (Chronic)    Chronic, historic condition BP is mildly elevated today but close to goal He is currently taking lisinopril 10 mg p.o. daily appears to be to already well Recommend he continues current regimen Recommend he continues to take blood pressure at home several times a week for monitoring He reports home BP measures are overall in goal Follow-up in 6 months or sooner if concerns arise        Endocrine   Hyperlipidemia associated with type 2 diabetes mellitus (HCC) (Chronic)    Chronic, historic condition Patient is currently taking atorvastatin 40 mg p.o. daily and appears to be tolerating well Continue current regimen Recheck lipid panel at follow-up in 6 months       Type 2 diabetes mellitus with hyperglycemia, with long-term current use of insulin (HCC) - Primary (Chronic)    Chronic, historic condition, ongoing Patient's most recent A1c was 6.2 He is managed by endocrinology He reports that he is currently taking Ozempic 2 mg weekly injection, Jardiance 25 mg p.o. daily.  He is also taking [place] as needed per endocrinology recommendations Will defer  to endocrinology management plan Foot exam completed today Follow-up in 6 months or sooner if concerns arise      Relevant Orders   HM DIABETES FOOT EXAM (Completed)     Return in about 6 months (around 07/11/2023) for Annual physical, DM, HTN, HLD.   I, Ildefonso Keaney E Jolane Bankhead, PA-C, have reviewed all documentation for this visit. The documentation on 01/10/23 for the exam, diagnosis, procedures, and orders are all accurate and complete.   Jacquelin Hawking, MHS, PA-C [facility]

## 2023-01-10 NOTE — Assessment & Plan Note (Signed)
Chronic, historic condition BP is mildly elevated today but close to goal He is currently taking lisinopril 10 mg p.o. daily appears to be to already well Recommend he continues current regimen Recommend he continues to take blood pressure at home several times a week for monitoring He reports home BP measures are overall in goal Follow-up in 6 months or sooner if concerns arise

## 2023-01-10 NOTE — Assessment & Plan Note (Signed)
Chronic, historic condition, ongoing Patient's most recent A1c was 6.2 He is managed by endocrinology He reports that he is currently taking Ozempic 2 mg weekly injection, Jardiance 25 mg p.o. daily.  He is also taking [place] as needed per endocrinology recommendations Will defer to endocrinology management plan Foot exam completed today Follow-up in 6 months or sooner if concerns arise

## 2023-01-12 ENCOUNTER — Other Ambulatory Visit: Payer: Self-pay | Admitting: Family Medicine

## 2023-01-12 DIAGNOSIS — E1169 Type 2 diabetes mellitus with other specified complication: Secondary | ICD-10-CM

## 2023-07-13 ENCOUNTER — Other Ambulatory Visit: Payer: Self-pay | Admitting: Family Medicine

## 2023-07-13 DIAGNOSIS — Z794 Long term (current) use of insulin: Secondary | ICD-10-CM

## 2023-07-13 DIAGNOSIS — E1165 Type 2 diabetes mellitus with hyperglycemia: Secondary | ICD-10-CM

## 2023-07-13 DIAGNOSIS — E1169 Type 2 diabetes mellitus with other specified complication: Secondary | ICD-10-CM

## 2023-07-14 NOTE — Telephone Encounter (Signed)
 Requested medication (s) are due for refill today: yes  Requested medication (s) are on the active medication list: yes  Last refill:  01/06/23  Future visit scheduled:no  Notes to clinic:  Unable to refill per protocol, last refill by another provider.      Requested Prescriptions  Pending Prescriptions Disp Refills   JARDIANCE 25 MG TABS tablet [Pharmacy Med Name: JARDIANCE 25 MG TABLET] 90 tablet 3    Sig: TAKE 1 TABLET (25 MG TOTAL) BY MOUTH DAILY.     Endocrinology:  Diabetes - SGLT2 Inhibitors Failed - 07/14/2023 11:32 AM      Failed - Cr in normal range and within 360 days    Creat  Date Value Ref Range Status  07/09/2022 0.92 0.60 - 1.29 mg/dL Final   Creatinine, Urine  Date Value Ref Range Status  10/15/2021 84.6  Final         Failed - HBA1C is between 0 and 7.9 and within 180 days    Hemoglobin A1C  Date Value Ref Range Status  06/10/2022 7.1  Final    Comment:    [facility]   Hgb A1c MFr Bld  Date Value Ref Range Status  12/31/2022 6.2 (H) 4.8 - 5.6 % Final    Comment:    (NOTE) Pre diabetes:          5.7%-6.4%  Diabetes:              >6.4%  Glycemic control for   <7.0% adults with diabetes          Failed - eGFR in normal range and within 360 days    GFR, Est African American  Date Value Ref Range Status  04/29/2020 112 > OR = 60 mL/min/1.66m2 Final   GFR, Est Non African American  Date Value Ref Range Status  04/29/2020 97 > OR = 60 mL/min/1.73m2 Final   eGFR  Date Value Ref Range Status  07/09/2022 103 > OR = 60 mL/min/1.51m2 Final  04/06/2021 96 >59 mL/min/1.73 Final         Failed - Valid encounter within last 6 months    Recent Outpatient Visits   None     Future Appointments             In 3 months McGowan, Danne Dustman, PA-C [facility] [place]             atorvastatin (LIPITOR) 40 MG tablet [Pharmacy Med Name: ATORVASTATIN 40 MG TABLET] 90 tablet 1    Sig: TAKE 1 TABLET BY MOUTH EVERY DAY     Cardiovascular:   Antilipid - Statins Failed - 07/14/2023 11:32 AM      Failed - Valid encounter within last 12 months    Recent Outpatient Visits   None     Future Appointments             In 3 months McGowan, Danne Dustman, PA-C [facility] [place]            Failed - Lipid Panel in normal range within the last 12 months    Cholesterol, Total  Date Value Ref Range Status  04/06/2021 160 100 - 199 mg/dL Final   Cholesterol  Date Value Ref Range Status  07/09/2022 99 <200 mg/dL Final   LDL Cholesterol (Calc)  Date Value Ref Range Status  07/09/2022 54 mg/dL (calc) Final    Comment:    Reference range: <100 . Desirable range <100 mg/dL for primary prevention;   <70  mg/dL for patients with CHD or diabetic patients  with > or = 2 CHD risk factors. Aaron Aas LDL-C is now calculated using the Martin-Hopkins  calculation, which is a validated novel method providing  better accuracy than the Friedewald equation in the  estimation of LDL-C.  Melinda Sprawls et al. Erroll Heard. 1308;657(84): 2061-2068  (http://education.[employer].com/faq/FAQ164)    HDL  Date Value Ref Range Status  07/09/2022 28 (L) > OR = 40 mg/dL Final  69/62/9528 26 (L) >39 mg/dL Final   Triglycerides  Date Value Ref Range Status  07/09/2022 90 <150 mg/dL Final         Passed - Patient is not pregnant

## 2023-09-21 IMAGING — CT CT HEART MORP W/ CTA COR W/ SCORE W/ CA W/CM &/OR W/O CM
1 of 14 series · 3 of 20 positions shown, 4 images · non-contrast
Comparison: None.

Addendum:
CLINICAL DATA: Chest pain

EXAM:
Cardiac/Coronary  CTA
TECHNIQUE: The patient was scanned on a Siemens Somatom go.Top scanner.

[Series 27: multiphase % cta coronary 0.60 · axial · 0.39mm/px · z∈[-1148,-1085]mm · 3 of 3780 slices shown, 4 images]
[im 945/3780  vessel]
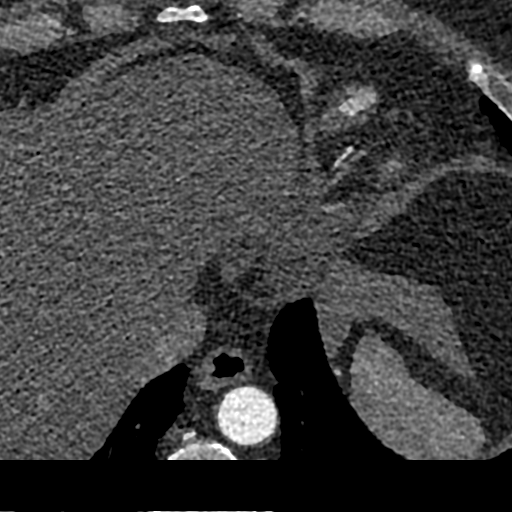
[im 945/3780  lung]
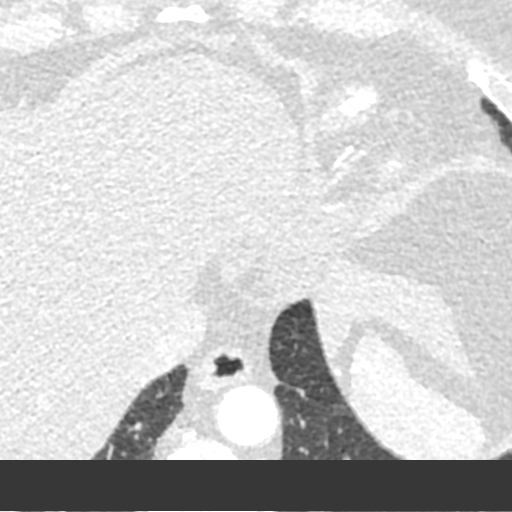
[im 1890/3780  vessel]
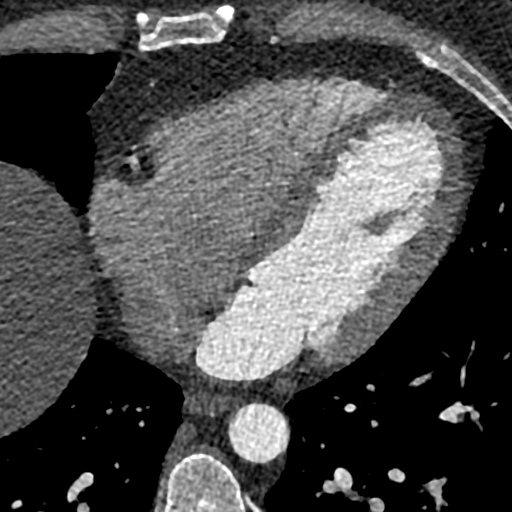
[im 2835/3780  vessel]
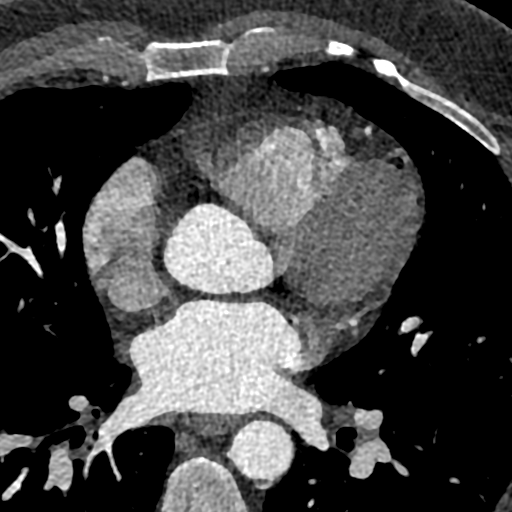

[3 of 20 positions shown; findings below may reference images not displayed]



Aortic Valve:  Trileaflet.  No calcifications.

Coronary Arteries:  Normal coronary origin.  Right dominance.

RCA is a dominant artery that gives rise to PDA and PLA. There is
calcified plaque in the proximal and distal RCA causing minimal
(<25%) stenosis.

Left main is a large artery that gives rise to LAD and LCX arteries.
There is no LM disease.

LAD has calcified and non calcified plaque in the mid LAD causing
mild (25-49%) stenosis.

LCX is a non-dominant artery that gives rise to one OM1 branch.
There is calcified plaque in the ostial LCx causing minimal
stenosis.

Other findings:

Normal pulmonary vein drainage into the left atrium.

Normal left atrial appendage without a thrombus.

Normal size of the pulmonary artery.
IMPRESSION: 1. Coronary calcium score of 91.8. This was 92nd percentile for age
and sex matched control.

2. Normal coronary origin with right dominance.

3. Mild mid LAD stenosis.  Minimal RCA and LCx disease.

4. CAD-RADS 2.  Mild non-obstructive CAD (25-49%).

5. Consider non-atherosclerotic causes of chest pain. Consider
preventive therapy and risk factor modification.

EXAM:
OVER-READ INTERPRETATION  CT CHEST

The following report is an over-read performed by radiologist Dr.
over-read does not include interpretation of cardiac or coronary
anatomy or pathology. The coronary CTA interpretation by the
cardiologist is attached.
FINDINGS: No mediastinal mass or adenopathy identified.

No pleural effusion, airspace consolidation, or atelectasis. Right
upper lobe subpleural nodule abutting the minor fissure measures 5
mm, image [DATE]. Right middle lobe lung nodule measures 4 mm, image
14/21. 3 mm nodule identified within the lingula.

Hepatic steatosis identified. No acute findings within the imaged
portions of the upper abdomen.

No acute or suspicious osseous findings.
IMPRESSION: 1. No acute abnormality.
2. Small nonspecific pulmonary nodules identified. No follow-up
needed if patient is low-risk (and has no known or suspected primary
neoplasm). Non-contrast chest CT can be considered in 12 months if
patient is high-risk. This recommendation follows the consensus
statement: Guidelines for Management of Incidental Pulmonary Nodules
Detected on CT Images: From the [HOSPITAL] 2774; Radiology
2774; [DATE].
3. Hepatic steatosis.



Aortic Valve:  Trileaflet.  No calcifications.

Coronary Arteries:  Normal coronary origin.  Right dominance.

RCA is a dominant artery that gives rise to PDA and PLA. There is
calcified plaque in the proximal and distal RCA causing minimal
(<25%) stenosis.

Left main is a large artery that gives rise to LAD and LCX arteries.
There is no LM disease.

LAD has calcified and non calcified plaque in the mid LAD causing
mild (25-49%) stenosis.

LCX is a non-dominant artery that gives rise to one OM1 branch.
There is calcified plaque in the ostial LCx causing minimal
stenosis.

Other findings:

Normal pulmonary vein drainage into the left atrium.

Normal left atrial appendage without a thrombus.

Normal size of the pulmonary artery.
IMPRESSION: 1. Coronary calcium score of 91.8. This was 92nd percentile for age
and sex matched control.

2. Normal coronary origin with right dominance.

3. Mild mid LAD stenosis.  Minimal RCA and LCx disease.

4. CAD-RADS 2.  Mild non-obstructive CAD (25-49%).

5. Consider non-atherosclerotic causes of chest pain. Consider
preventive therapy and risk factor modification.

## 2023-10-17 NOTE — Progress Notes (Unsigned)
 10/19/23 10:19 AM   Nancyann JONELLE Daring Aug 30, 1975 [id number]  Referring provider:  Leavy Mole, PA-C [address] [place],  [place] [address]  Urological history: 1. ED - Several year history of ED however worse the last 1-2 years - Using sildenafil  which was initially effective however recently has not been effective even with increasing to 150 mg - Significant organic risk factors including diabetes, hypertension, hyperlipidemia, antihypertensive medication and 1-1.5 pack/day x 30 years smoking history (quit 6 years ago) - Prolonged erection with 2 mcg of Trimix (30/1/10)  2. 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 - Detumescence was able to be achieved after Winter shunt was completed.  2. Nephrolithiasis  -spontaneous passage of left UVJ, 2016   3. Undesired fertility  -vasectomy, 2015   4. Scrotal mass  -scrotal US , 2004 -2.4 X 2.4 X 2.3 CM IN DIAMETER COMPLICATED MASS/NODULE INFERIOR TO THE RIGHT TESTIS  -resolved  5. Hydroceles  -scrotal US , 2004 0- SMALL BILATERAL HYDROCELES   6. Epididymal cyst  -scrotal US , 2004 - TINY SPERMATOCELE VERSUS EPIDIDYMAL CYST RIGHT EPIDIDYMAL HEAD.      Chief Complaint  Patient presents with   Follow-up    HPI: GRANTLEY SAVAGE is a 48 y.o.male who presents today for a 1 year follow-up.   Previous records reviewed.   He reports a good strong stream and no bothersome urinary symptoms.  Patient denies any modifying or aggravating factors.  Patient denies any recent UTI's, gross hematuria, dysuria or suprapubic/flank pain.  Patient denies any fevers, chills, nausea or vomiting.   [facility].  He has hypogonadism and is managed through [facility].  He is having no  issues with erections.  He reports spontaneous erections, no pain with erections and no curvature with erections.  He is satisfied with tadalafil  5 mg daily boosting with tadalafil  20 mg on demand dosing.  PMH: Past Medical History:  Diagnosis Date   Abdominal aortic atherosclerosis (HCC)    Chronic pain of multiple joints 01/23/2016   CKD (chronic kidney disease)    Colon polyp    Coronary artery disease, non-occlusive 01/09/2012   Coronary CT Angiogram: Coronary Calcium  Score 92.  CAD RADS 2.  Mild-nonobstructive disease in the LAD (25-49%).  Minimal (<25%) disease in RCA and LCx.  Right dominant system normal origins.   CRP elevated 01/23/2016   Erectile dysfunction    a.) on PDE5i (tadalafil )   Flexor tenosynovitis of thumb 05/27/2016   Former smoker    Hepatic steatosis 12/2020   noted on chest CT.   History of kidney stones    Hyperlipidemia    Hypertension    Kidney stone 11/22/2014   Long term current use of immunosuppressive drug    Low testosterone  in male    MRSA infection around age 86   left hand wound   Pulmonary nodules    Rheumatoid arthritis, 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HCC) 01/23/2016   Trigeminal neuralgia 06/03/2015   Type 2 diabetes mellitus, uncontrolled    Vitamin D  deficiency 07/09/2016    Surgical History: Past Surgical History:  Procedure Laterality Date   APPENDECTOMY     CARPAL TUNNEL RELEASE Bilateral 01/06/2023   Procedure: CARPAL TUNNEL RELEASE ENDOSCOPIC;  Surgeon: Edie Norleen PARAS, MD;  Location: [facility] ORS;  Service: Orthopedics;  Laterality: Bilateral;   CHOLECYSTECTOMY N/A 05/06/2017   Procedure: LAPAROSCOPIC CHOLECYSTECTOMY;  Surgeon: Nicholaus Selinda Birmingham, MD;  Location: [facility] ORS;  Service: General;  Laterality: N/A;   COLONOSCOPY WITH PROPOFOL  N/A 06/13/2020   Procedure: COLONOSCOPY WITH PROPOFOL ;  Surgeon: Janalyn Keene NOVAK, MD;  Location: [facility] ENDOSCOPY;  Service: Endoscopy;  Laterality: N/A;   COLONOSCOPY WITH PROPOFOL  N/A 11/05/2022   Procedure:  COLONOSCOPY WITH PROPOFOL ;  Surgeon: Therisa Bi, MD;  Location[facility] ENDOSCOPY;  Service: Gastroenterology;  Laterality: N/A;   POLYPECTOMY  11/05/2022   Procedure: POLYPECTOMY;  Surgeon: Therisa Bi, MD;  Location: [facility] ENDOSCOPY;  Service: Gastroenterology;;   TONSILLECTOMY     TONSILLECTOMY     VASECTOMY  03/2013    Home Medications:  Allergies as of 10/19/2023   No Known Allergies      Medication List        Accurate as of October 19, 2023 10:19 AM. If you have any questions, ask your nurse or doctor.          anastrozole 1 MG tablet Commonly known as: ARIMIDEX Take 1 mg by mouth once a week. Saturdays   aspirin EC 81 MG tablet Take 81 mg by mouth daily.   atorvastatin  40 MG tablet Commonly known as: LIPITOR TAKE 1 TABLET BY MOUTH EVERY DAY   cholecalciferol 25 MCG (1000 UNIT) tablet Commonly known as: VITAMIN D3 Take 1 tablet (1,000 Units total) by mouth daily.   Dexcom G7 Sensor Misc USE 1 EACH EVERY 10 (TEN) DAYS   empagliflozin  25 MG Tabs tablet Commonly known as: JARDIANCE  Take 1 tablet (25 mg total) by mouth every morning.   HYDROcodone -acetaminophen  5-325 MG tablet Commonly known as: Norco Take 1 tablet by mouth every 6 (six) hours as needed for moderate pain.   lisinopril  10 MG tablet Commonly known as: ZESTRIL  Take 1 tablet (10 mg total) by mouth every morning.   multivitamin with minerals Tabs tablet Take 1 tablet by mouth daily.   Ozempic  (2 MG/DOSE) 8 MG/3ML Sopn Generic drug: Semaglutide  (2 MG/DOSE) Inject 2 mg into the skin once a week. Saturdays   tadalafil  20 MG tablet Commonly known as: CIALIS  Take 1 tablet (20 mg total) by mouth daily as needed for erectile dysfunction. What changed: See the new instructions. Changed by: CLOTILDA CORNWALL   tadalafil  5 MG tablet Commonly known as: CIALIS  Take 1 tablet (5 mg total) by mouth every morning. What changed: Another medication with the same name was changed. Make sure you understand how  and when to take each. Changed by: CLOTILDA CORNWALL   [place] FlexTouch 200 UNIT/ML FlexTouch Pen Generic drug: insulin  degludec 60 Units as needed (Pt has been having alot better control over bs and only uses this rarely now).   VITAMIN C PO Take 1 tablet by mouth daily.        Allergies:  No Known Allergies  Family History: Family History  Problem Relation Age of Onset   Diabetes type II Mother        resolved after GOP   Diabetes type I Sister        type 1   Diabetes Sister    Dementia Maternal Grandmother    AAA (abdominal aortic aneurysm) Maternal Grandfather    Hypertension Maternal Grandfather    Heart disease Maternal Uncle     Social History:  reports that he quit smoking about 8 years ago. His smoking use included cigarettes. He started smoking about 34 years ago. He  has a 25.9 pack-year smoking history. He has never used smokeless tobacco. He reports current alcohol use. He reports that he does not use drugs.   Physical Exam: BP (!) 146/95   Pulse 76   Ht 6' 1 (1.854 m)   Wt 230 lb (104.3 kg)   BMI 30.34 kg/m   Constitutional:  Well nourished. Alert and oriented, No acute distress. HEENT: [place] AT, moist mucus membranes.  Trachea midline Cardiovascular: No clubbing, cyanosis, or edema. Respiratory: Normal respiratory effort, no increased work of breathing. Psychiatric: Normal mood and affect.   Laboratory Data: See EPIC and HPI  I have reviewed the labs.  See HPI.     Pertinent Imaging: N/A  Assessment & Plan:    1. ED - At goal with tadalafil  5 mg daily, boosting with tadalafil  20 mg on demand dosing  2. Hypogonadism - Managed through [facility] clinic  3. PSA screening - Patient states his PSA is being monitored through [facility]  Return in about 1 year (around 10/18/2024) for symptom recheck .  CLOTILDA HELON RIGGERS   [facility] [address] [place], [place] [address] [phone]

## 2023-10-19 ENCOUNTER — Encounter: Payer: Self-pay | Admitting: Urology

## 2023-10-19 ENCOUNTER — Ambulatory Visit (INDEPENDENT_AMBULATORY_CARE_PROVIDER_SITE_OTHER): Payer: Self-pay | Admitting: Urology

## 2023-10-19 VITALS — BP 146/95 | HR 76 | Ht 73.0 in | Wt 230.0 lb

## 2023-10-19 DIAGNOSIS — N5201 Erectile dysfunction due to arterial insufficiency: Secondary | ICD-10-CM

## 2023-10-19 DIAGNOSIS — Z125 Encounter for screening for malignant neoplasm of prostate: Secondary | ICD-10-CM

## 2023-10-19 DIAGNOSIS — N529 Male erectile dysfunction, unspecified: Secondary | ICD-10-CM

## 2023-10-19 DIAGNOSIS — E291 Testicular hypofunction: Secondary | ICD-10-CM

## 2023-10-19 MED ORDER — TADALAFIL 5 MG PO TABS: 5.0000 mg | ORAL_TABLET | ORAL | 3 refills | Status: AC

## 2023-10-19 MED ORDER — TADALAFIL 20 MG PO TABS: 20.0000 mg | ORAL_TABLET | Freq: Every day | ORAL | 3 refills | Status: AC | PRN

## 2024-03-30 ENCOUNTER — Emergency Department
Admission: EM | Admit: 2024-03-30 | Discharge: 2024-03-30 | Disposition: A | Discharge status: Home / Self Care | Payer: Self-pay | Attending: Emergency Medicine | Admitting: Emergency Medicine

## 2024-03-30 ENCOUNTER — Emergency Department: Payer: Self-pay

## 2024-03-30 ENCOUNTER — Other Ambulatory Visit: Payer: Self-pay

## 2024-03-30 ENCOUNTER — Encounter: Payer: Self-pay | Admitting: Emergency Medicine

## 2024-03-30 DIAGNOSIS — S61213A Laceration without foreign body of left middle finger without damage to nail, initial encounter: Secondary | ICD-10-CM | POA: Insufficient documentation

## 2024-03-30 DIAGNOSIS — S62633B Displaced fracture of distal phalanx of left middle finger, initial encounter for open fracture: Secondary | ICD-10-CM | POA: Insufficient documentation

## 2024-03-30 DIAGNOSIS — Z23 Encounter for immunization: Secondary | ICD-10-CM | POA: Insufficient documentation

## 2024-03-30 DIAGNOSIS — I10 Essential (primary) hypertension: Secondary | ICD-10-CM | POA: Insufficient documentation

## 2024-03-30 DIAGNOSIS — W208XXA Other cause of strike by thrown, projected or falling object, initial encounter: Secondary | ICD-10-CM | POA: Insufficient documentation

## 2024-03-30 DIAGNOSIS — E119 Type 2 diabetes mellitus without complications: Secondary | ICD-10-CM | POA: Insufficient documentation

## 2024-03-30 MED ORDER — OXYCODONE HCL 5 MG PO TABS: 5.0000 mg | ORAL_TABLET | Freq: Three times a day (TID) | ORAL | 0 refills | Status: AC | PRN

## 2024-03-30 MED ORDER — LIDOCAINE HCL (PF) 1 % IJ SOLN
5.0000 mL | Freq: Once | INTRAMUSCULAR | Status: AC
  Administered 2024-03-30: 5 mL via INTRADERMAL
  Filled 2024-03-30: qty 5

## 2024-03-30 MED ORDER — CEPHALEXIN 500 MG PO CAPS: 500.0000 mg | ORAL_CAPSULE | Freq: Three times a day (TID) | ORAL | 0 refills | Status: AC

## 2024-03-30 MED ORDER — TETANUS-DIPHTH-ACELL PERTUSSIS 5-2-15.5 LF-MCG/0.5 IM SUSP
0.5000 mL | Freq: Once | INTRAMUSCULAR | Status: AC
  Administered 2024-03-30: 0.5 mL via INTRAMUSCULAR
  Filled 2024-03-30: qty 0.5

## 2024-03-30 MED ORDER — LIDOCAINE HCL (PF) 1 % IJ SOLN
5.0000 mL | Freq: Once | INTRAMUSCULAR | Status: DC
  Filled 2024-03-30: qty 5

## 2024-03-30 NOTE — Discharge Instructions (Addendum)
 Wash the area with soap and water.  Leave the bandage on for at least 24 hours. Follow-up with Dr. Ezra at [facility].  He is a hand specialist.  He will need to be seen next week. Tell his office that you were seen in the emergency department, have a open fracture of your finger and we would like for it to be rechecked next week. Sutures should be removed in 7 to 10 days Your tetanus is good for 10 years.

## 2024-03-30 NOTE — ED Triage Notes (Signed)
 Presents with laceration to left hand  Hand a 40-50 pd drill bitt drop on hand

## 2024-03-30 NOTE — ED Provider Notes (Signed)
 "  [facility] Provider Note    Event Date/Time   First MD Initiated Contact with Patient 03/30/24 1305     (approximate)   History   Laceration   HPI  Nathan Rojas is a 49 y.o. male history of diabetes, hypertension, RA presents emergency department after left middle finger injury.  Patient dropped a 90 pound drill head on his finger while cleaning up the shop.  Patient is right-handed.  Unsure of his last Tdap.      Physical Exam   Triage Vital Signs: ED Triage Vitals  Encounter Vitals Group     BP 03/30/24 1311 (!) 146/98     Girls Systolic BP Percentile --      Girls Diastolic BP Percentile --      Boys Systolic BP Percentile --      Boys Diastolic BP Percentile --      Pulse Rate 03/30/24 1311 91     Resp 03/30/24 1311 (!) 22     Temp 03/30/24 1311 98.1 F (36.7 C)     Temp Source 03/30/24 1311 Oral     SpO2 03/30/24 1311 96 %     Weight 03/30/24 1310 229 lb 4.5 oz (104 kg)     Height 03/30/24 1310 6' 1 (1.854 m)     Head Circumference --      Peak Flow --      Pain Score 03/30/24 1309 3     Pain Loc --      Pain Education --      Exclude from Growth Chart --     Most recent vital signs: Vitals:   03/30/24 1311  BP: (!) 146/98  Pulse: 91  Resp: (!) 22  Temp: 98.1 F (36.7 C)  SpO2: 96%     General: Awake, no distress.   CV:  Good peripheral perfusion.  Resp:  Normal effort.  Abd:  No distention.   Other:  Left middle finger with large laceration noted around the distal portion, almost completely avulsed fingertip, active bleeding noted   ED Results / Procedures / Treatments   Labs (all labs ordered are listed, but only abnormal results are displayed) Labs Reviewed - No data to display   EKG     RADIOLOGY X-ray left middle finger    PROCEDURES:   .Laceration Repair  Date/Time: 03/30/2024 3:42 PM  Performed by: Gasper Devere ORN, PA-C Authorized by: Gasper Devere ORN, PA-C   Consent:    Consent  obtained:  Verbal   Consent given by:  Patient   Risks, benefits, and alternatives were discussed: yes     Risks discussed:  Infection, pain, retained foreign body, tendon damage, poor cosmetic result, need for additional repair, nerve damage, poor wound healing and vascular damage   Alternatives discussed:  No treatment Universal protocol:    Procedure explained and questions answered to patient or proxy's satisfaction: yes     Immediately prior to procedure, a time out was called: yes     Patient identity confirmed:  Verbally with patient Anesthesia:    Anesthesia method:  Nerve block   Block location:  Left middle finger   Block needle gauge:  27 G   Block anesthetic:  Lidocaine  1% w/o epi   Block technique:  Digital block   Block injection procedure:  Anatomic landmarks identified, introduced needle, incremental injection, anatomic landmarks palpated and negative aspiration for blood   Block outcome:  Incomplete block Laceration details:    Location:  Finger   Finger location:  L long finger   Length (cm):  4 Pre-procedure details:    Preparation:  Patient was prepped and draped in usual sterile fashion and imaging obtained to evaluate for foreign bodies Exploration:    Limited defect created (wound extended): no     Hemostasis achieved with:  Direct pressure   Imaging obtained: x-ray     Imaging outcome: foreign body not noted     Wound exploration: wound explored through full range of motion and entire depth of wound visualized     Wound extent: underlying fracture     Wound extent: areolar tissue not violated, fascia not violated, no foreign body, no signs of injury, no nerve damage, no tendon damage and no vascular damage     Contaminated: no   Treatment:    Area cleansed with:  Povidone-iodine and saline   Amount of cleaning:  Extensive   Irrigation solution:  Sterile saline   Irrigation method:  Pressure wash, syringe and tap   Debridement:  None   Undermining:  None    Scar revision: no   Skin repair:    Repair method:  Sutures   Suture size:  5-0   Suture material:  Nylon   Suture technique:  Simple interrupted   Number of sutures:  12 Approximation:    Approximation:  Close Repair type:    Repair type:  Simple Post-procedure details:    Dressing:  Non-adherent dressing and splint for protection   Procedure completion:  Tolerated well, no immediate complications   Critical Care:   Chief Complaint  Patient presents with   Laceration      MEDICATIONS ORDERED IN ED: Medications  lidocaine  (PF) (XYLOCAINE ) 1 % injection 5 mL (has no administration in time range)  Tdap (ADACEL ) injection 0.5 mL (0.5 mLs Intramuscular Given 03/30/24 1358)  lidocaine  (PF) (XYLOCAINE ) 1 % injection 5 mL (5 mLs Intradermal Given 03/30/24 1359)     IMPRESSION / MDM / ASSESSMENT AND PLAN / ED COURSE  I reviewed the triage vital signs and the nursing notes.                              Differential diagnosis includes, but is not limited to, fracture, open fracture, contusion, laceration, avulsion, amputation  Patient's presentation is most consistent with acute illness / injury with system symptoms.   Medications given: Tdap, Xylocaine  for digital block  X-ray left middle finger, independent review interpretation by me as having a distal phalanx fracture, radiologist read pending, confirmed by radiology for comminuted displaced fracture.  See procedure note for laceration repair  Did soak the finger in Betadine and saline for 20 minutes.  Will start him on antibiotics at discharge.  Pain medication at discharge.  Strict instructions to follow-up with hand specialist at [facility].  Patient is in agreement treatment plan.  He was given prescription for Keflex , oxycodone  and discharged stable condition.  Return if worsening      FINAL CLINICAL IMPRESSION(S) / ED DIAGNOSES   Final diagnoses:  Open displaced fracture of distal phalanx of left middle  finger, initial encounter     Rx / DC Orders   ED Discharge Orders          Ordered    cephALEXin  (KEFLEX ) 500 MG capsule  3 times daily        03/30/24 1510    oxyCODONE  (ROXICODONE ) 5 MG immediate release tablet  Every 8 hours PRN        03/30/24 1510             Note:  This document was prepared using Dragon voice recognition software and may include unintentional dictation errors.    Gasper Devere ORN, PA-C 03/30/24 1544    Jossie Artist POUR, MD 03/31/24 [phone]  "

## 2024-10-18 ENCOUNTER — Ambulatory Visit: Admitting: Urology
